# Patient Record
Sex: Female | Born: 1940 | Race: White | Hispanic: No | Marital: Married | Smoking: Former smoker
Health system: Southern US, Community
[De-identification: ages and names within clinical notes are randomized; demographics above are authoritative.]

## PROBLEM LIST (undated history)

## (undated) DIAGNOSIS — E1169 Type 2 diabetes mellitus with other specified complication: Secondary | ICD-10-CM

## (undated) DIAGNOSIS — I10 Essential (primary) hypertension: Secondary | ICD-10-CM

## (undated) DIAGNOSIS — E782 Mixed hyperlipidemia: Secondary | ICD-10-CM

## (undated) DIAGNOSIS — M810 Age-related osteoporosis without current pathological fracture: Secondary | ICD-10-CM

## (undated) HISTORY — PX: CHOLECYSTECTOMY: SHX55

## (undated) HISTORY — DX: Mixed hyperlipidemia: E11.69

## (undated) HISTORY — DX: Mixed hyperlipidemia: E78.2

## (undated) HISTORY — DX: Age-related osteoporosis without current pathological fracture: M81.0

---
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
## 2004-09-07 ENCOUNTER — Ambulatory Visit: Payer: Self-pay | Admitting: Family Medicine

## 2004-10-11 ENCOUNTER — Ambulatory Visit: Payer: Self-pay | Admitting: Family Medicine

## 2004-12-06 ENCOUNTER — Ambulatory Visit: Payer: Self-pay | Admitting: Family Medicine

## 2005-01-23 ENCOUNTER — Ambulatory Visit: Payer: Self-pay | Admitting: Family Medicine

## 2005-02-15 ENCOUNTER — Ambulatory Visit: Payer: Self-pay | Admitting: Family Medicine

## 2005-06-26 ENCOUNTER — Ambulatory Visit: Payer: Self-pay | Admitting: Family Medicine

## 2005-07-22 ENCOUNTER — Ambulatory Visit: Payer: Self-pay | Admitting: Family Medicine

## 2005-10-10 ENCOUNTER — Ambulatory Visit: Payer: Self-pay | Admitting: Family Medicine

## 2005-12-19 ENCOUNTER — Ambulatory Visit: Payer: Self-pay | Admitting: Family Medicine

## 2012-04-07 DIAGNOSIS — N390 Urinary tract infection, site not specified: Secondary | ICD-10-CM | POA: Diagnosis not present

## 2012-04-07 DIAGNOSIS — N760 Acute vaginitis: Secondary | ICD-10-CM | POA: Diagnosis not present

## 2012-04-07 DIAGNOSIS — F329 Major depressive disorder, single episode, unspecified: Secondary | ICD-10-CM | POA: Diagnosis not present

## 2012-04-08 DIAGNOSIS — Z79899 Other long term (current) drug therapy: Secondary | ICD-10-CM | POA: Diagnosis not present

## 2012-04-08 DIAGNOSIS — E785 Hyperlipidemia, unspecified: Secondary | ICD-10-CM | POA: Diagnosis not present

## 2012-04-08 DIAGNOSIS — I10 Essential (primary) hypertension: Secondary | ICD-10-CM | POA: Diagnosis not present

## 2012-04-08 DIAGNOSIS — E119 Type 2 diabetes mellitus without complications: Secondary | ICD-10-CM | POA: Diagnosis not present

## 2012-05-20 DIAGNOSIS — F329 Major depressive disorder, single episode, unspecified: Secondary | ICD-10-CM | POA: Diagnosis not present

## 2012-06-28 DIAGNOSIS — E7800 Pure hypercholesterolemia, unspecified: Secondary | ICD-10-CM | POA: Diagnosis not present

## 2012-06-28 DIAGNOSIS — I10 Essential (primary) hypertension: Secondary | ICD-10-CM | POA: Diagnosis not present

## 2012-06-28 DIAGNOSIS — E119 Type 2 diabetes mellitus without complications: Secondary | ICD-10-CM | POA: Diagnosis not present

## 2012-06-28 DIAGNOSIS — R040 Epistaxis: Secondary | ICD-10-CM | POA: Diagnosis not present

## 2012-07-22 DIAGNOSIS — E785 Hyperlipidemia, unspecified: Secondary | ICD-10-CM | POA: Diagnosis not present

## 2012-07-22 DIAGNOSIS — N750 Cyst of Bartholin's gland: Secondary | ICD-10-CM | POA: Diagnosis not present

## 2012-07-22 DIAGNOSIS — E782 Mixed hyperlipidemia: Secondary | ICD-10-CM | POA: Diagnosis not present

## 2012-07-22 DIAGNOSIS — E119 Type 2 diabetes mellitus without complications: Secondary | ICD-10-CM | POA: Diagnosis not present

## 2012-07-22 DIAGNOSIS — I10 Essential (primary) hypertension: Secondary | ICD-10-CM | POA: Diagnosis not present

## 2012-07-22 DIAGNOSIS — B373 Candidiasis of vulva and vagina: Secondary | ICD-10-CM | POA: Diagnosis not present

## 2012-07-22 DIAGNOSIS — F329 Major depressive disorder, single episode, unspecified: Secondary | ICD-10-CM | POA: Diagnosis not present

## 2012-08-24 DIAGNOSIS — R5383 Other fatigue: Secondary | ICD-10-CM | POA: Diagnosis not present

## 2012-08-24 DIAGNOSIS — R5381 Other malaise: Secondary | ICD-10-CM | POA: Diagnosis not present

## 2012-08-24 DIAGNOSIS — R42 Dizziness and giddiness: Secondary | ICD-10-CM | POA: Diagnosis not present

## 2012-08-24 DIAGNOSIS — R112 Nausea with vomiting, unspecified: Secondary | ICD-10-CM | POA: Diagnosis not present

## 2013-01-12 DIAGNOSIS — H2589 Other age-related cataract: Secondary | ICD-10-CM | POA: Diagnosis not present

## 2013-02-02 DIAGNOSIS — H269 Unspecified cataract: Secondary | ICD-10-CM | POA: Diagnosis not present

## 2013-02-02 DIAGNOSIS — H2589 Other age-related cataract: Secondary | ICD-10-CM | POA: Diagnosis not present

## 2013-02-02 DIAGNOSIS — I2510 Atherosclerotic heart disease of native coronary artery without angina pectoris: Secondary | ICD-10-CM | POA: Diagnosis not present

## 2013-02-02 DIAGNOSIS — E119 Type 2 diabetes mellitus without complications: Secondary | ICD-10-CM | POA: Diagnosis not present

## 2013-02-02 DIAGNOSIS — H259 Unspecified age-related cataract: Secondary | ICD-10-CM | POA: Diagnosis not present

## 2013-03-02 DIAGNOSIS — H259 Unspecified age-related cataract: Secondary | ICD-10-CM | POA: Diagnosis not present

## 2013-03-02 DIAGNOSIS — I10 Essential (primary) hypertension: Secondary | ICD-10-CM | POA: Diagnosis not present

## 2013-03-02 DIAGNOSIS — H269 Unspecified cataract: Secondary | ICD-10-CM | POA: Diagnosis not present

## 2013-03-02 DIAGNOSIS — H2589 Other age-related cataract: Secondary | ICD-10-CM | POA: Diagnosis not present

## 2013-03-02 DIAGNOSIS — E119 Type 2 diabetes mellitus without complications: Secondary | ICD-10-CM | POA: Diagnosis not present

## 2013-04-14 DIAGNOSIS — E119 Type 2 diabetes mellitus without complications: Secondary | ICD-10-CM | POA: Diagnosis not present

## 2013-04-14 DIAGNOSIS — F329 Major depressive disorder, single episode, unspecified: Secondary | ICD-10-CM | POA: Diagnosis not present

## 2013-04-14 DIAGNOSIS — I10 Essential (primary) hypertension: Secondary | ICD-10-CM | POA: Diagnosis not present

## 2013-05-11 DIAGNOSIS — K591 Functional diarrhea: Secondary | ICD-10-CM | POA: Diagnosis not present

## 2013-07-14 DIAGNOSIS — F329 Major depressive disorder, single episode, unspecified: Secondary | ICD-10-CM | POA: Diagnosis not present

## 2013-07-14 DIAGNOSIS — E119 Type 2 diabetes mellitus without complications: Secondary | ICD-10-CM | POA: Diagnosis not present

## 2013-07-14 DIAGNOSIS — E785 Hyperlipidemia, unspecified: Secondary | ICD-10-CM | POA: Diagnosis not present

## 2013-07-14 DIAGNOSIS — B373 Candidiasis of vulva and vagina: Secondary | ICD-10-CM | POA: Diagnosis not present

## 2013-07-14 DIAGNOSIS — I10 Essential (primary) hypertension: Secondary | ICD-10-CM | POA: Diagnosis not present

## 2013-11-03 DIAGNOSIS — M1610 Unilateral primary osteoarthritis, unspecified hip: Secondary | ICD-10-CM | POA: Diagnosis not present

## 2013-11-03 DIAGNOSIS — E119 Type 2 diabetes mellitus without complications: Secondary | ICD-10-CM | POA: Diagnosis not present

## 2013-11-03 DIAGNOSIS — F3289 Other specified depressive episodes: Secondary | ICD-10-CM | POA: Diagnosis not present

## 2013-11-03 DIAGNOSIS — F329 Major depressive disorder, single episode, unspecified: Secondary | ICD-10-CM | POA: Diagnosis not present

## 2013-11-03 DIAGNOSIS — E785 Hyperlipidemia, unspecified: Secondary | ICD-10-CM | POA: Diagnosis not present

## 2013-11-03 DIAGNOSIS — I10 Essential (primary) hypertension: Secondary | ICD-10-CM | POA: Diagnosis not present

## 2014-02-02 DIAGNOSIS — N390 Urinary tract infection, site not specified: Secondary | ICD-10-CM | POA: Diagnosis not present

## 2014-02-02 DIAGNOSIS — M1610 Unilateral primary osteoarthritis, unspecified hip: Secondary | ICD-10-CM | POA: Diagnosis not present

## 2014-02-02 DIAGNOSIS — F3289 Other specified depressive episodes: Secondary | ICD-10-CM | POA: Diagnosis not present

## 2014-02-02 DIAGNOSIS — E785 Hyperlipidemia, unspecified: Secondary | ICD-10-CM | POA: Diagnosis not present

## 2014-02-02 DIAGNOSIS — I10 Essential (primary) hypertension: Secondary | ICD-10-CM | POA: Diagnosis not present

## 2014-02-02 DIAGNOSIS — F329 Major depressive disorder, single episode, unspecified: Secondary | ICD-10-CM | POA: Diagnosis not present

## 2014-02-02 DIAGNOSIS — E119 Type 2 diabetes mellitus without complications: Secondary | ICD-10-CM | POA: Diagnosis not present

## 2014-05-06 DIAGNOSIS — Z9849 Cataract extraction status, unspecified eye: Secondary | ICD-10-CM | POA: Diagnosis not present

## 2014-05-06 DIAGNOSIS — I10 Essential (primary) hypertension: Secondary | ICD-10-CM | POA: Diagnosis not present

## 2014-05-06 DIAGNOSIS — H5231 Anisometropia: Secondary | ICD-10-CM | POA: Diagnosis not present

## 2014-05-06 DIAGNOSIS — H524 Presbyopia: Secondary | ICD-10-CM | POA: Diagnosis not present

## 2014-05-06 DIAGNOSIS — E119 Type 2 diabetes mellitus without complications: Secondary | ICD-10-CM | POA: Diagnosis not present

## 2014-05-06 DIAGNOSIS — H35379 Puckering of macula, unspecified eye: Secondary | ICD-10-CM | POA: Diagnosis not present

## 2014-05-31 DIAGNOSIS — I10 Essential (primary) hypertension: Secondary | ICD-10-CM | POA: Diagnosis not present

## 2014-05-31 DIAGNOSIS — F329 Major depressive disorder, single episode, unspecified: Secondary | ICD-10-CM | POA: Diagnosis not present

## 2014-05-31 DIAGNOSIS — F3289 Other specified depressive episodes: Secondary | ICD-10-CM | POA: Diagnosis not present

## 2014-05-31 DIAGNOSIS — E119 Type 2 diabetes mellitus without complications: Secondary | ICD-10-CM | POA: Diagnosis not present

## 2014-05-31 DIAGNOSIS — Z6835 Body mass index (BMI) 35.0-35.9, adult: Secondary | ICD-10-CM | POA: Diagnosis not present

## 2014-05-31 DIAGNOSIS — M1610 Unilateral primary osteoarthritis, unspecified hip: Secondary | ICD-10-CM | POA: Diagnosis not present

## 2014-06-02 DIAGNOSIS — M76899 Other specified enthesopathies of unspecified lower limb, excluding foot: Secondary | ICD-10-CM | POA: Diagnosis not present

## 2014-06-08 DIAGNOSIS — M76899 Other specified enthesopathies of unspecified lower limb, excluding foot: Secondary | ICD-10-CM | POA: Diagnosis not present

## 2014-06-08 DIAGNOSIS — M25559 Pain in unspecified hip: Secondary | ICD-10-CM | POA: Diagnosis not present

## 2014-06-08 DIAGNOSIS — M25659 Stiffness of unspecified hip, not elsewhere classified: Secondary | ICD-10-CM | POA: Diagnosis not present

## 2014-06-16 DIAGNOSIS — M25659 Stiffness of unspecified hip, not elsewhere classified: Secondary | ICD-10-CM | POA: Diagnosis not present

## 2014-06-16 DIAGNOSIS — M76899 Other specified enthesopathies of unspecified lower limb, excluding foot: Secondary | ICD-10-CM | POA: Diagnosis not present

## 2014-06-16 DIAGNOSIS — M25559 Pain in unspecified hip: Secondary | ICD-10-CM | POA: Diagnosis not present

## 2014-06-27 DIAGNOSIS — H814 Vertigo of central origin: Secondary | ICD-10-CM | POA: Diagnosis not present

## 2014-06-27 DIAGNOSIS — Z6835 Body mass index (BMI) 35.0-35.9, adult: Secondary | ICD-10-CM | POA: Diagnosis not present

## 2014-06-28 DIAGNOSIS — R5381 Other malaise: Secondary | ICD-10-CM | POA: Diagnosis not present

## 2014-06-28 DIAGNOSIS — I10 Essential (primary) hypertension: Secondary | ICD-10-CM | POA: Diagnosis not present

## 2014-06-28 DIAGNOSIS — N390 Urinary tract infection, site not specified: Secondary | ICD-10-CM | POA: Diagnosis not present

## 2014-06-28 DIAGNOSIS — H8110 Benign paroxysmal vertigo, unspecified ear: Secondary | ICD-10-CM | POA: Diagnosis not present

## 2014-06-28 DIAGNOSIS — Z79899 Other long term (current) drug therapy: Secondary | ICD-10-CM | POA: Diagnosis not present

## 2014-06-28 DIAGNOSIS — R0602 Shortness of breath: Secondary | ICD-10-CM | POA: Diagnosis not present

## 2014-06-28 DIAGNOSIS — E7800 Pure hypercholesterolemia, unspecified: Secondary | ICD-10-CM | POA: Diagnosis not present

## 2014-06-28 DIAGNOSIS — E119 Type 2 diabetes mellitus without complications: Secondary | ICD-10-CM | POA: Diagnosis not present

## 2014-06-28 DIAGNOSIS — R42 Dizziness and giddiness: Secondary | ICD-10-CM | POA: Diagnosis not present

## 2014-07-01 DIAGNOSIS — E860 Dehydration: Secondary | ICD-10-CM | POA: Diagnosis not present

## 2014-07-01 DIAGNOSIS — N390 Urinary tract infection, site not specified: Secondary | ICD-10-CM | POA: Diagnosis not present

## 2014-07-07 DIAGNOSIS — L98499 Non-pressure chronic ulcer of skin of other sites with unspecified severity: Secondary | ICD-10-CM | POA: Diagnosis not present

## 2014-07-07 DIAGNOSIS — T8351XD Infection and inflammatory reaction due to indwelling urinary catheter, subsequent encounter: Secondary | ICD-10-CM | POA: Diagnosis not present

## 2014-07-07 DIAGNOSIS — Z6836 Body mass index (BMI) 36.0-36.9, adult: Secondary | ICD-10-CM | POA: Diagnosis not present

## 2014-07-07 DIAGNOSIS — R197 Diarrhea, unspecified: Secondary | ICD-10-CM | POA: Diagnosis not present

## 2014-07-14 DIAGNOSIS — Z6836 Body mass index (BMI) 36.0-36.9, adult: Secondary | ICD-10-CM | POA: Diagnosis not present

## 2014-07-14 DIAGNOSIS — H8149 Vertigo of central origin, unspecified ear: Secondary | ICD-10-CM | POA: Diagnosis not present

## 2014-07-14 DIAGNOSIS — R197 Diarrhea, unspecified: Secondary | ICD-10-CM | POA: Diagnosis not present

## 2014-07-14 DIAGNOSIS — E860 Dehydration: Secondary | ICD-10-CM | POA: Diagnosis not present

## 2014-07-19 DIAGNOSIS — M7061 Trochanteric bursitis, right hip: Secondary | ICD-10-CM | POA: Diagnosis not present

## 2014-08-11 DIAGNOSIS — Z1231 Encounter for screening mammogram for malignant neoplasm of breast: Secondary | ICD-10-CM | POA: Diagnosis not present

## 2014-08-26 DIAGNOSIS — M79675 Pain in left toe(s): Secondary | ICD-10-CM | POA: Diagnosis not present

## 2014-09-05 DIAGNOSIS — L608 Other nail disorders: Secondary | ICD-10-CM | POA: Diagnosis not present

## 2014-09-05 DIAGNOSIS — S90415S Abrasion, left lesser toe(s), sequela: Secondary | ICD-10-CM | POA: Diagnosis not present

## 2014-09-05 DIAGNOSIS — M2042 Other hammer toe(s) (acquired), left foot: Secondary | ICD-10-CM | POA: Diagnosis not present

## 2014-09-05 DIAGNOSIS — E119 Type 2 diabetes mellitus without complications: Secondary | ICD-10-CM | POA: Diagnosis not present

## 2014-10-05 DIAGNOSIS — I10 Essential (primary) hypertension: Secondary | ICD-10-CM | POA: Diagnosis not present

## 2014-10-05 DIAGNOSIS — E119 Type 2 diabetes mellitus without complications: Secondary | ICD-10-CM | POA: Diagnosis not present

## 2014-10-05 DIAGNOSIS — E785 Hyperlipidemia, unspecified: Secondary | ICD-10-CM | POA: Diagnosis not present

## 2014-10-05 DIAGNOSIS — Z6836 Body mass index (BMI) 36.0-36.9, adult: Secondary | ICD-10-CM | POA: Diagnosis not present

## 2014-10-06 DIAGNOSIS — Z23 Encounter for immunization: Secondary | ICD-10-CM | POA: Diagnosis not present

## 2014-12-28 DIAGNOSIS — R42 Dizziness and giddiness: Secondary | ICD-10-CM | POA: Diagnosis not present

## 2015-06-23 DIAGNOSIS — E119 Type 2 diabetes mellitus without complications: Secondary | ICD-10-CM | POA: Diagnosis not present

## 2015-06-23 DIAGNOSIS — I10 Essential (primary) hypertension: Secondary | ICD-10-CM | POA: Diagnosis not present

## 2015-06-23 DIAGNOSIS — E785 Hyperlipidemia, unspecified: Secondary | ICD-10-CM | POA: Diagnosis not present

## 2015-08-15 DIAGNOSIS — Z1231 Encounter for screening mammogram for malignant neoplasm of breast: Secondary | ICD-10-CM | POA: Diagnosis not present

## 2015-11-03 DIAGNOSIS — E785 Hyperlipidemia, unspecified: Secondary | ICD-10-CM | POA: Diagnosis not present

## 2015-11-03 DIAGNOSIS — E119 Type 2 diabetes mellitus without complications: Secondary | ICD-10-CM | POA: Diagnosis not present

## 2015-11-03 DIAGNOSIS — R42 Dizziness and giddiness: Secondary | ICD-10-CM | POA: Diagnosis not present

## 2015-11-03 DIAGNOSIS — R1310 Dysphagia, unspecified: Secondary | ICD-10-CM | POA: Diagnosis not present

## 2015-11-03 DIAGNOSIS — I10 Essential (primary) hypertension: Secondary | ICD-10-CM | POA: Diagnosis not present

## 2015-11-03 DIAGNOSIS — Z6833 Body mass index (BMI) 33.0-33.9, adult: Secondary | ICD-10-CM | POA: Diagnosis not present

## 2015-12-07 DIAGNOSIS — R1310 Dysphagia, unspecified: Secondary | ICD-10-CM | POA: Diagnosis not present

## 2015-12-13 DIAGNOSIS — K222 Esophageal obstruction: Secondary | ICD-10-CM | POA: Diagnosis not present

## 2015-12-13 DIAGNOSIS — K210 Gastro-esophageal reflux disease with esophagitis: Secondary | ICD-10-CM | POA: Diagnosis not present

## 2015-12-13 DIAGNOSIS — K2210 Ulcer of esophagus without bleeding: Secondary | ICD-10-CM | POA: Diagnosis not present

## 2015-12-13 DIAGNOSIS — K2950 Unspecified chronic gastritis without bleeding: Secondary | ICD-10-CM | POA: Diagnosis not present

## 2015-12-13 DIAGNOSIS — R1310 Dysphagia, unspecified: Secondary | ICD-10-CM | POA: Diagnosis not present

## 2015-12-26 DIAGNOSIS — Z5181 Encounter for therapeutic drug level monitoring: Secondary | ICD-10-CM | POA: Diagnosis not present

## 2015-12-26 DIAGNOSIS — Z9181 History of falling: Secondary | ICD-10-CM | POA: Diagnosis not present

## 2015-12-26 DIAGNOSIS — R42 Dizziness and giddiness: Secondary | ICD-10-CM | POA: Diagnosis not present

## 2015-12-28 DIAGNOSIS — K210 Gastro-esophageal reflux disease with esophagitis: Secondary | ICD-10-CM | POA: Diagnosis not present

## 2015-12-28 DIAGNOSIS — K222 Esophageal obstruction: Secondary | ICD-10-CM | POA: Diagnosis not present

## 2015-12-28 DIAGNOSIS — K5900 Constipation, unspecified: Secondary | ICD-10-CM | POA: Diagnosis not present

## 2016-03-12 DIAGNOSIS — E119 Type 2 diabetes mellitus without complications: Secondary | ICD-10-CM | POA: Diagnosis not present

## 2016-03-12 DIAGNOSIS — F329 Major depressive disorder, single episode, unspecified: Secondary | ICD-10-CM | POA: Diagnosis not present

## 2016-03-12 DIAGNOSIS — Z6834 Body mass index (BMI) 34.0-34.9, adult: Secondary | ICD-10-CM | POA: Diagnosis not present

## 2016-03-12 DIAGNOSIS — E785 Hyperlipidemia, unspecified: Secondary | ICD-10-CM | POA: Diagnosis not present

## 2016-03-12 DIAGNOSIS — I10 Essential (primary) hypertension: Secondary | ICD-10-CM | POA: Diagnosis not present

## 2016-04-17 DIAGNOSIS — R0602 Shortness of breath: Secondary | ICD-10-CM | POA: Diagnosis not present

## 2016-04-17 DIAGNOSIS — Z79899 Other long term (current) drug therapy: Secondary | ICD-10-CM | POA: Diagnosis not present

## 2016-04-17 DIAGNOSIS — E860 Dehydration: Secondary | ICD-10-CM | POA: Diagnosis not present

## 2016-04-17 DIAGNOSIS — R531 Weakness: Secondary | ICD-10-CM | POA: Diagnosis not present

## 2016-04-17 DIAGNOSIS — M5431 Sciatica, right side: Secondary | ICD-10-CM | POA: Diagnosis not present

## 2016-05-15 DIAGNOSIS — M1612 Unilateral primary osteoarthritis, left hip: Secondary | ICD-10-CM | POA: Diagnosis not present

## 2016-05-15 DIAGNOSIS — Z6833 Body mass index (BMI) 33.0-33.9, adult: Secondary | ICD-10-CM | POA: Diagnosis not present

## 2016-05-15 DIAGNOSIS — M5430 Sciatica, unspecified side: Secondary | ICD-10-CM | POA: Diagnosis not present

## 2016-05-23 DIAGNOSIS — E237 Disorder of pituitary gland, unspecified: Secondary | ICD-10-CM | POA: Diagnosis not present

## 2016-05-23 DIAGNOSIS — R531 Weakness: Secondary | ICD-10-CM | POA: Diagnosis not present

## 2016-05-23 DIAGNOSIS — R404 Transient alteration of awareness: Secondary | ICD-10-CM | POA: Diagnosis not present

## 2016-05-23 DIAGNOSIS — R55 Syncope and collapse: Secondary | ICD-10-CM | POA: Diagnosis not present

## 2016-05-24 DIAGNOSIS — R55 Syncope and collapse: Secondary | ICD-10-CM | POA: Diagnosis not present

## 2016-05-24 DIAGNOSIS — E237 Disorder of pituitary gland, unspecified: Secondary | ICD-10-CM | POA: Diagnosis not present

## 2016-05-27 DIAGNOSIS — E237 Disorder of pituitary gland, unspecified: Secondary | ICD-10-CM | POA: Diagnosis not present

## 2016-05-27 DIAGNOSIS — Z6833 Body mass index (BMI) 33.0-33.9, adult: Secondary | ICD-10-CM | POA: Diagnosis not present

## 2016-05-27 DIAGNOSIS — E230 Hypopituitarism: Secondary | ICD-10-CM | POA: Diagnosis not present

## 2016-05-28 DIAGNOSIS — R55 Syncope and collapse: Secondary | ICD-10-CM | POA: Diagnosis not present

## 2016-05-28 DIAGNOSIS — E236 Other disorders of pituitary gland: Secondary | ICD-10-CM | POA: Diagnosis not present

## 2016-05-28 DIAGNOSIS — E237 Disorder of pituitary gland, unspecified: Secondary | ICD-10-CM | POA: Diagnosis not present

## 2016-06-05 DIAGNOSIS — D352 Benign neoplasm of pituitary gland: Secondary | ICD-10-CM | POA: Diagnosis not present

## 2016-06-05 DIAGNOSIS — Z6832 Body mass index (BMI) 32.0-32.9, adult: Secondary | ICD-10-CM | POA: Diagnosis not present

## 2016-06-07 DIAGNOSIS — E237 Disorder of pituitary gland, unspecified: Secondary | ICD-10-CM | POA: Diagnosis not present

## 2016-06-07 DIAGNOSIS — E039 Hypothyroidism, unspecified: Secondary | ICD-10-CM | POA: Diagnosis not present

## 2016-06-07 DIAGNOSIS — Z6832 Body mass index (BMI) 32.0-32.9, adult: Secondary | ICD-10-CM | POA: Diagnosis not present

## 2016-06-12 DIAGNOSIS — E230 Hypopituitarism: Secondary | ICD-10-CM | POA: Diagnosis not present

## 2016-06-12 DIAGNOSIS — F329 Major depressive disorder, single episode, unspecified: Secondary | ICD-10-CM | POA: Diagnosis not present

## 2016-06-12 DIAGNOSIS — E039 Hypothyroidism, unspecified: Secondary | ICD-10-CM | POA: Diagnosis not present

## 2016-06-12 DIAGNOSIS — E119 Type 2 diabetes mellitus without complications: Secondary | ICD-10-CM | POA: Diagnosis not present

## 2016-06-19 DIAGNOSIS — L988 Other specified disorders of the skin and subcutaneous tissue: Secondary | ICD-10-CM | POA: Diagnosis not present

## 2016-06-19 DIAGNOSIS — E860 Dehydration: Secondary | ICD-10-CM | POA: Diagnosis not present

## 2016-06-19 DIAGNOSIS — K529 Noninfective gastroenteritis and colitis, unspecified: Secondary | ICD-10-CM | POA: Diagnosis not present

## 2016-07-08 DIAGNOSIS — R42 Dizziness and giddiness: Secondary | ICD-10-CM | POA: Diagnosis not present

## 2016-07-08 DIAGNOSIS — E0590 Thyrotoxicosis, unspecified without thyrotoxic crisis or storm: Secondary | ICD-10-CM | POA: Diagnosis not present

## 2016-07-08 DIAGNOSIS — I6789 Other cerebrovascular disease: Secondary | ICD-10-CM | POA: Diagnosis not present

## 2016-07-08 DIAGNOSIS — R0602 Shortness of breath: Secondary | ICD-10-CM | POA: Diagnosis not present

## 2016-07-08 DIAGNOSIS — R531 Weakness: Secondary | ICD-10-CM | POA: Diagnosis not present

## 2016-07-17 DIAGNOSIS — E0580 Other thyrotoxicosis without thyrotoxic crisis or storm: Secondary | ICD-10-CM | POA: Diagnosis not present

## 2016-08-02 DIAGNOSIS — S63502A Unspecified sprain of left wrist, initial encounter: Secondary | ICD-10-CM | POA: Diagnosis not present

## 2016-08-21 DIAGNOSIS — Z1231 Encounter for screening mammogram for malignant neoplasm of breast: Secondary | ICD-10-CM | POA: Diagnosis not present

## 2016-08-28 DIAGNOSIS — Z7689 Persons encountering health services in other specified circumstances: Secondary | ICD-10-CM | POA: Diagnosis not present

## 2016-08-28 DIAGNOSIS — E0580 Other thyrotoxicosis without thyrotoxic crisis or storm: Secondary | ICD-10-CM | POA: Diagnosis not present

## 2016-11-26 DIAGNOSIS — B86 Scabies: Secondary | ICD-10-CM | POA: Diagnosis not present

## 2016-11-29 ENCOUNTER — Other Ambulatory Visit: Payer: Self-pay | Admitting: Neurosurgery

## 2016-11-29 DIAGNOSIS — D352 Benign neoplasm of pituitary gland: Secondary | ICD-10-CM

## 2016-12-02 DIAGNOSIS — E119 Type 2 diabetes mellitus without complications: Secondary | ICD-10-CM | POA: Diagnosis not present

## 2016-12-02 DIAGNOSIS — I10 Essential (primary) hypertension: Secondary | ICD-10-CM | POA: Diagnosis not present

## 2016-12-02 DIAGNOSIS — E782 Mixed hyperlipidemia: Secondary | ICD-10-CM | POA: Diagnosis not present

## 2016-12-02 DIAGNOSIS — D352 Benign neoplasm of pituitary gland: Secondary | ICD-10-CM | POA: Diagnosis not present

## 2016-12-02 DIAGNOSIS — E0500 Thyrotoxicosis with diffuse goiter without thyrotoxic crisis or storm: Secondary | ICD-10-CM | POA: Diagnosis not present

## 2016-12-11 DIAGNOSIS — N959 Unspecified menopausal and perimenopausal disorder: Secondary | ICD-10-CM | POA: Diagnosis not present

## 2016-12-11 DIAGNOSIS — M8588 Other specified disorders of bone density and structure, other site: Secondary | ICD-10-CM | POA: Diagnosis not present

## 2016-12-11 DIAGNOSIS — M8580 Other specified disorders of bone density and structure, unspecified site: Secondary | ICD-10-CM | POA: Diagnosis not present

## 2016-12-11 DIAGNOSIS — Z780 Asymptomatic menopausal state: Secondary | ICD-10-CM | POA: Diagnosis not present

## 2016-12-14 ENCOUNTER — Ambulatory Visit
Admission: RE | Admit: 2016-12-14 | Discharge: 2016-12-14 | Disposition: A | Discharge status: Home / Self Care | Payer: Medicare Other | Source: Ambulatory Visit | Attending: Neurosurgery | Admitting: Neurosurgery

## 2016-12-14 DIAGNOSIS — R51 Headache: Secondary | ICD-10-CM | POA: Diagnosis not present

## 2016-12-14 DIAGNOSIS — D352 Benign neoplasm of pituitary gland: Secondary | ICD-10-CM

## 2016-12-14 MED ORDER — GADOBENATE DIMEGLUMINE 529 MG/ML IV SOLN
10.0000 mL | Freq: Once | INTRAVENOUS | Status: AC | PRN
  Administered 2016-12-14: 10 mL via INTRAVENOUS

## 2016-12-19 DIAGNOSIS — R946 Abnormal results of thyroid function studies: Secondary | ICD-10-CM | POA: Diagnosis not present

## 2016-12-20 DIAGNOSIS — E0590 Thyrotoxicosis, unspecified without thyrotoxic crisis or storm: Secondary | ICD-10-CM | POA: Diagnosis not present

## 2016-12-20 DIAGNOSIS — R946 Abnormal results of thyroid function studies: Secondary | ICD-10-CM | POA: Diagnosis not present

## 2017-04-02 DIAGNOSIS — D352 Benign neoplasm of pituitary gland: Secondary | ICD-10-CM | POA: Diagnosis not present

## 2017-04-02 DIAGNOSIS — E119 Type 2 diabetes mellitus without complications: Secondary | ICD-10-CM | POA: Diagnosis not present

## 2017-04-02 DIAGNOSIS — E782 Mixed hyperlipidemia: Secondary | ICD-10-CM | POA: Diagnosis not present

## 2017-04-02 DIAGNOSIS — I10 Essential (primary) hypertension: Secondary | ICD-10-CM | POA: Diagnosis not present

## 2017-04-02 DIAGNOSIS — M169 Osteoarthritis of hip, unspecified: Secondary | ICD-10-CM | POA: Diagnosis not present

## 2017-04-02 DIAGNOSIS — E0500 Thyrotoxicosis with diffuse goiter without thyrotoxic crisis or storm: Secondary | ICD-10-CM | POA: Diagnosis not present

## 2017-04-27 IMAGING — MR MR HEAD WO/W CM
19 series · 48 of 48 positions shown · IV contrast (multihance)
Comparison: Brain MRI 05/28/2016

CLINICAL DATA: 75-year-old female with frontal headaches, hearing
loss and confusion. Pituitary enlargement felt related to adenoma on
7690 MRI. Subsequent encounter.

Creatinine was obtained on site at [HOSPITAL] at [HOSPITAL].
Results: Creatinine 0.9 mg/dL.
EXAM:
MRI HEAD WITHOUT AND WITH CONTRAST
TECHNIQUE: Multiplanar, multiecho pulse sequences of the brain and surrounding
structures were obtained without and with intravenous contrast.
CONTRAST:  10mL MULTIHANCE GADOBENATE DIMEGLUMINE 529 MG/ML IV SOLN

[Series 5: T1 · sagittal · 4.0mm · 0.72mm/px · 2 of 27 slices shown (1 of 5)]
[im 1/27]
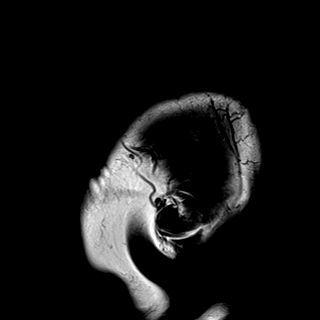
[im 27/27]
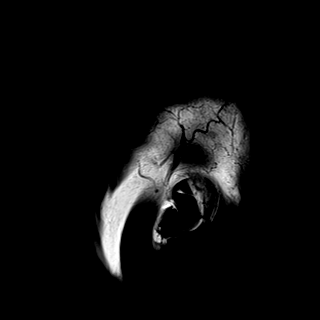

[Series 6: DWI · axial · 3.0mm · 1.44mm/px · z∈[-70,+86]mm · 7 of 82 slices shown (1 of 2)]
[im 1/82]
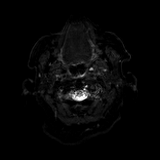
[im 14/82]
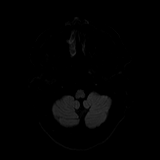
[im 28/82]
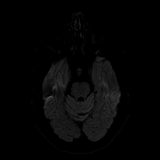
[im 41/82]
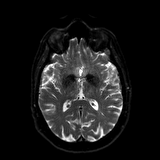
[im 55/82]
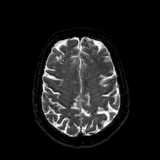
[im 68/82]
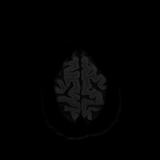
[im 82/82]
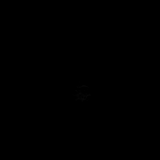

[Series 7: DWI · axial · 3.0mm · 1.44mm/px · z∈[-70,+86]mm · 4 of 41 slices shown (2 of 2)]
[im 1/41]
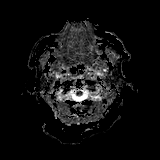
[im 14/41]
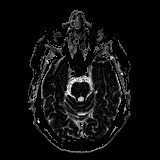
[im 27/41]
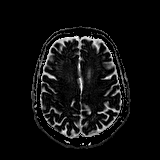
[im 41/41]
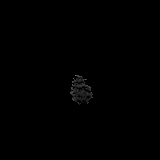

[Series 8: T2 · axial · 4.0mm · 0.36mm/px · z∈[-59,+71]mm · 2 of 26 slices shown]
[im 1/26]
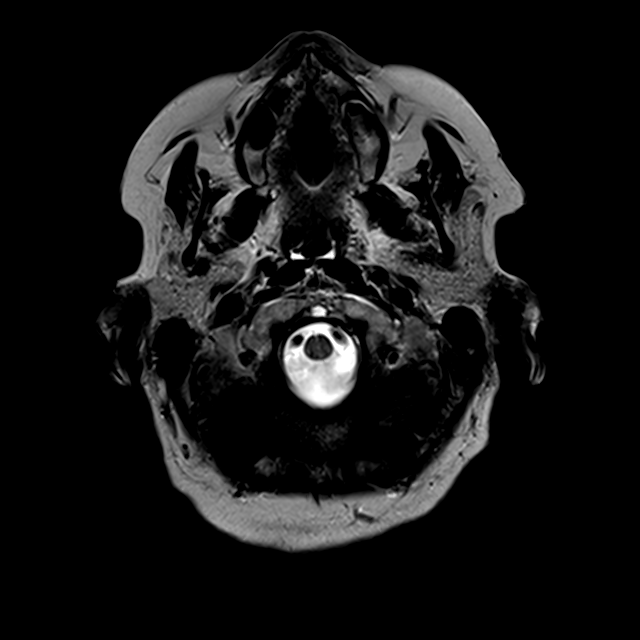
[im 26/26]
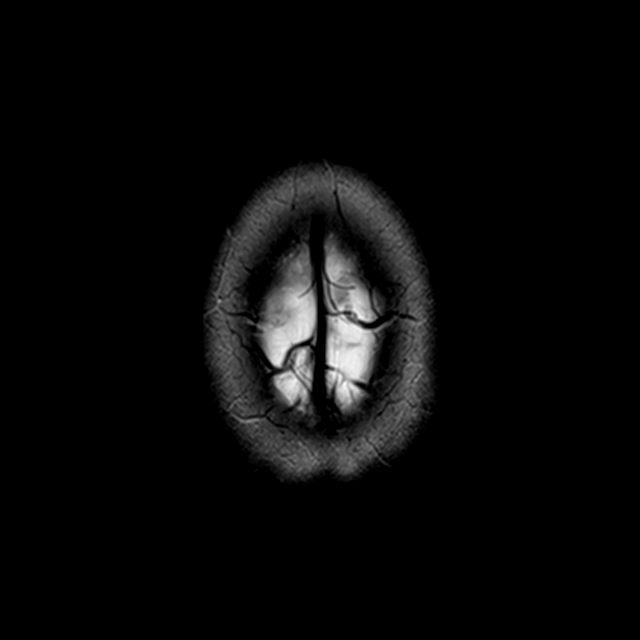

[Series 9: GRE · axial · 4.0mm · 0.69mm/px · z∈[-58,+72]mm · 2 of 26 slices shown]
[im 1/26]
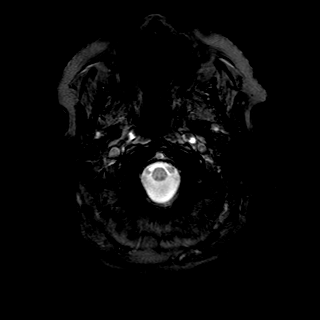
[im 26/26]
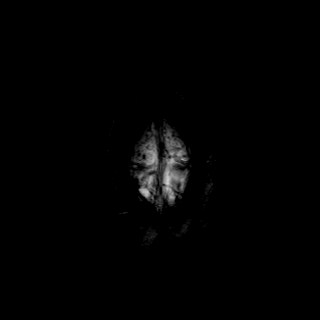

[Series 10: FLAIR · axial · 4.0mm · 0.72mm/px · z∈[-58,+71]mm · 2 of 26 slices shown]
[im 1/26]
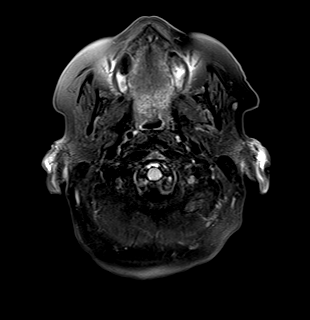
[im 26/26]
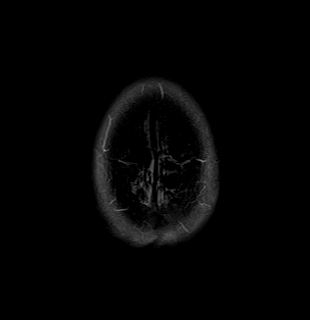

[Series 11: T1 · sagittal · 3.0mm · 0.42mm/px · 1 of 13 slices shown (2 of 5)]
[im 1/13]
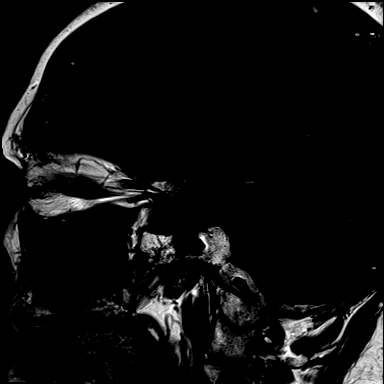

[Series 12: T1 · coronal · 3.0mm · 0.42mm/px · 1 of 10 slices shown (3 of 5)]
[im 1/10]
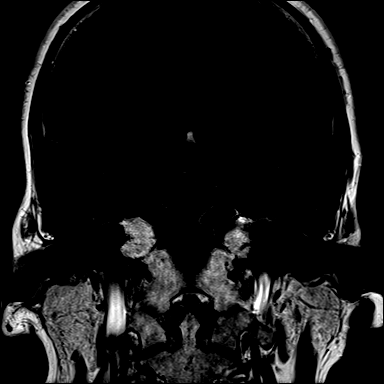

[Series 13: T1 · coronal · non-contrast · 3.0mm · 0.62mm/px · 1 of 9 slices shown (4 of 5)]
[im 1/9]
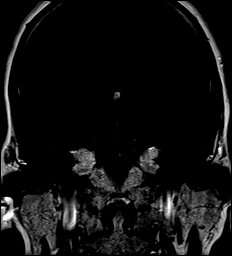

[Series 14: cor post dyn · coronal · 3.0mm · 0.62mm/px · 1 of 9 slices shown (1 of 6)]
[im 1/9]
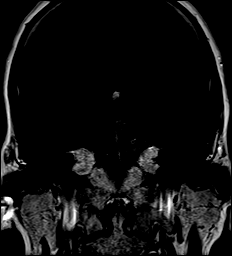

[Series 15: cor post dyn · coronal · 3.0mm · 0.62mm/px · 1 of 9 slices shown (2 of 6)]
[im 1/9]
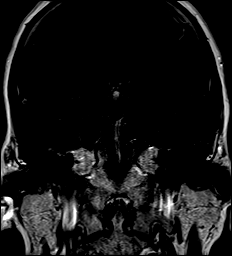

[Series 16: cor post dyn · coronal · 3.0mm · 0.62mm/px · 1 of 9 slices shown (3 of 6)]
[im 1/9]
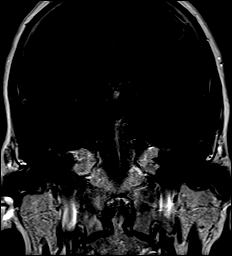

[Series 17: cor post dyn · coronal · 3.0mm · 0.62mm/px · 1 of 9 slices shown (4 of 6)]
[im 1/9]
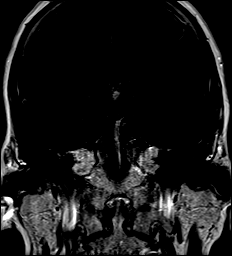

[Series 18: cor post dyn · coronal · 3.0mm · 0.62mm/px · 1 of 9 slices shown (5 of 6)]
[im 1/9]
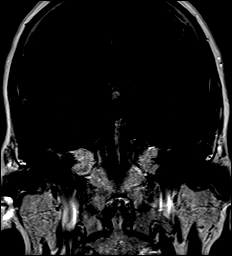

[Series 19: cor post dyn · coronal · 3.0mm · 0.62mm/px · 1 of 9 slices shown (6 of 6)]
[im 1/9]
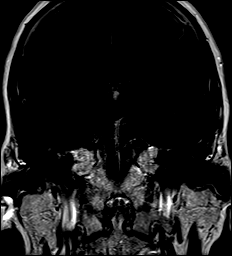

[Series 20: T1 post-contrast · sagittal · 3.0mm · 0.42mm/px · 1 of 13 slices shown (1 of 3)]
[im 1/13]
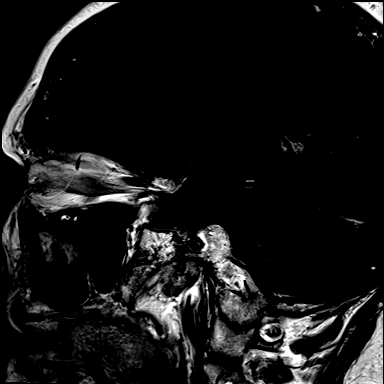

[Series 21: T1 post-contrast · coronal · 3.0mm · 0.42mm/px · 1 of 10 slices shown (2 of 3)]
[im 1/10]
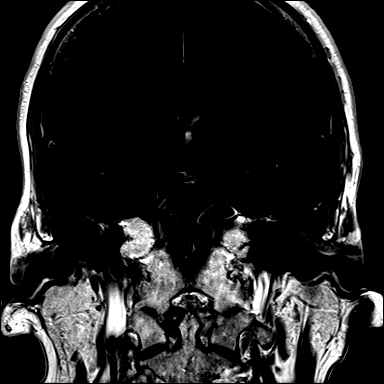

[Series 22: T1 · axial · 1.0mm · 0.86mm/px · z∈[-66,+108]mm · 15 of 175 slices shown (5 of 5)]
[im 1/175]
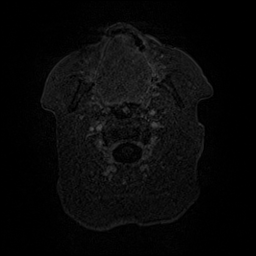
[im 13/175]
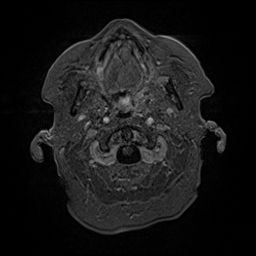
[im 25/175]
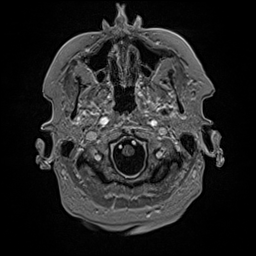
[im 38/175]
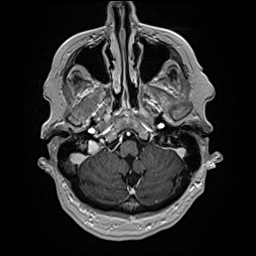
[im 50/175]
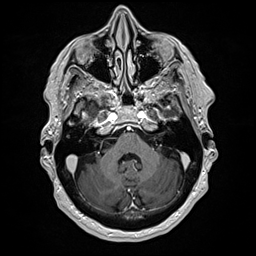
[im 63/175]
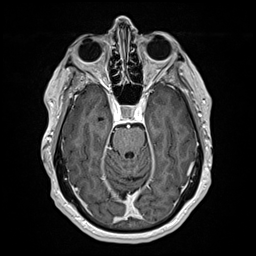
[im 75/175]
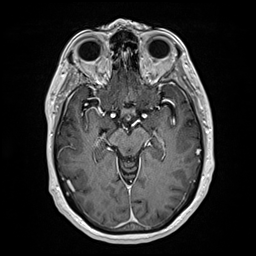
[im 88/175]
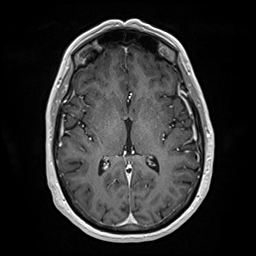
[im 100/175]
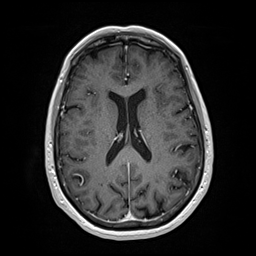
[im 112/175]
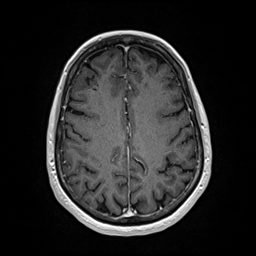
[im 125/175]
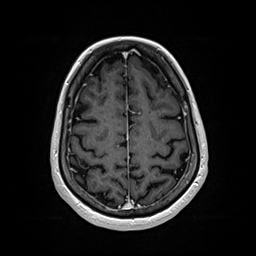
[im 137/175]
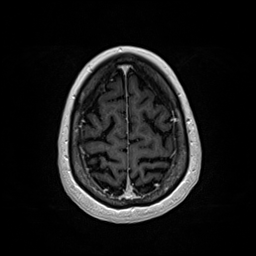
[im 150/175]
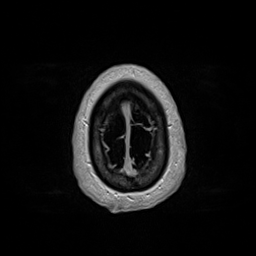
[im 162/175]
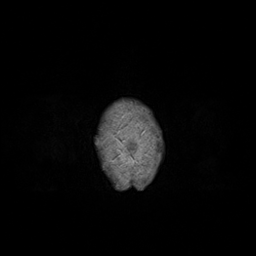
[im 175/175]
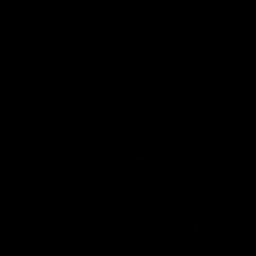

[Series 23: T1 post-contrast · coronal · 4.0mm · 0.47mm/px · 3 of 32 slices shown (3 of 3)]
[im 1/32]
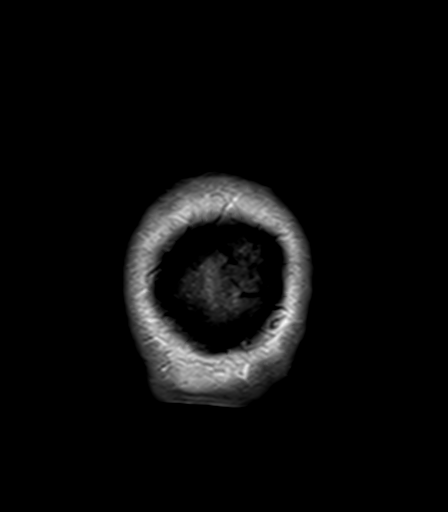
[im 16/32]
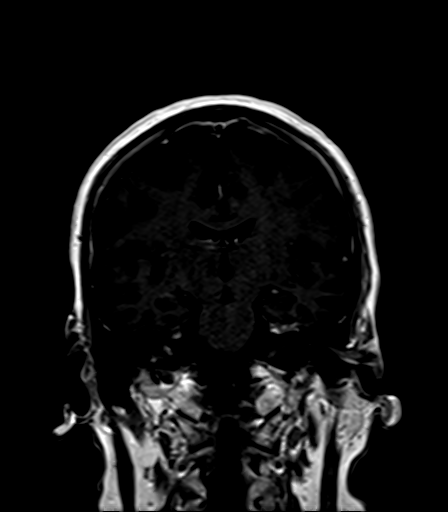
[im 32/32]
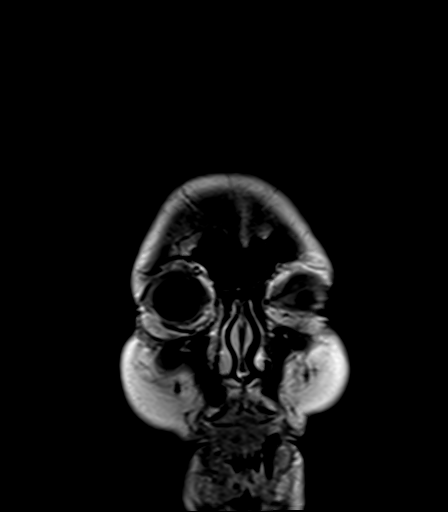

[48 of 48 positions shown; findings below may reference images not displayed]

FINDINGS: Brain: No restricted diffusion to suggest acute infarction. No
midline shift, mass effect, evidence of mass lesion,
ventriculomegaly, extra-axial collection or acute intracranial
hemorrhage. Cervicomedullary junction within normal limits.

Widely scattered cerebral white matter T2 and FLAIR hyperintensity
is stable since 7690. No cortical encephalomalacia. No chronic
cerebral blood products. Mild thalamic T2 heterogeneity may be
related to perivascular spaces. Excluding the pituitary region, no
abnormal intracranial enhancement. No dural thickening.

Vascular: Major intracranial vascular flow voids are stable and
appear normal.

Skull and upper cervical spine: Negative. Normal bone marrow signal.

Sinuses/Orbits: Stable and negative. Visible internal auditory
structures appear normal. Negative scalp soft tissues.

Other: Round heterogeneously hypoenhancing mass in the right aspect
of the sella turcica is stable measuring 10 x 9 x 10 mm (AP by
transverse by CC). Associated mild deviation of the infundibulum to
the left. None mildly convex upper margin of the pituitary but no
other suprasellar extension or mass effect. Normal hypothalamus. No
cavernous sinus involvement. No other abnormal enhancement.
IMPRESSION: 1. Stable rounded pituitary adenoma measuring 10 mm occupying the
right aspect of the sella. No suprasellar or cavernous sinus
extension.
2. No new intracranial abnormality. Stable MRI appearance of the
brain since [DATE].

## 2017-08-06 DIAGNOSIS — E782 Mixed hyperlipidemia: Secondary | ICD-10-CM | POA: Diagnosis not present

## 2017-08-06 DIAGNOSIS — M169 Osteoarthritis of hip, unspecified: Secondary | ICD-10-CM | POA: Diagnosis not present

## 2017-08-06 DIAGNOSIS — M810 Age-related osteoporosis without current pathological fracture: Secondary | ICD-10-CM | POA: Diagnosis not present

## 2017-08-06 DIAGNOSIS — E119 Type 2 diabetes mellitus without complications: Secondary | ICD-10-CM | POA: Diagnosis not present

## 2017-08-06 DIAGNOSIS — D352 Benign neoplasm of pituitary gland: Secondary | ICD-10-CM | POA: Diagnosis not present

## 2017-08-06 DIAGNOSIS — E0500 Thyrotoxicosis with diffuse goiter without thyrotoxic crisis or storm: Secondary | ICD-10-CM | POA: Diagnosis not present

## 2017-08-06 DIAGNOSIS — I10 Essential (primary) hypertension: Secondary | ICD-10-CM | POA: Diagnosis not present

## 2017-12-08 DIAGNOSIS — E782 Mixed hyperlipidemia: Secondary | ICD-10-CM | POA: Diagnosis not present

## 2017-12-08 DIAGNOSIS — E119 Type 2 diabetes mellitus without complications: Secondary | ICD-10-CM | POA: Diagnosis not present

## 2017-12-08 DIAGNOSIS — M169 Osteoarthritis of hip, unspecified: Secondary | ICD-10-CM | POA: Diagnosis not present

## 2017-12-08 DIAGNOSIS — D352 Benign neoplasm of pituitary gland: Secondary | ICD-10-CM | POA: Diagnosis not present

## 2017-12-08 DIAGNOSIS — M810 Age-related osteoporosis without current pathological fracture: Secondary | ICD-10-CM | POA: Diagnosis not present

## 2017-12-08 DIAGNOSIS — E0500 Thyrotoxicosis with diffuse goiter without thyrotoxic crisis or storm: Secondary | ICD-10-CM | POA: Diagnosis not present

## 2017-12-08 DIAGNOSIS — I10 Essential (primary) hypertension: Secondary | ICD-10-CM | POA: Diagnosis not present

## 2018-01-16 DIAGNOSIS — N390 Urinary tract infection, site not specified: Secondary | ICD-10-CM | POA: Diagnosis not present

## 2018-01-16 DIAGNOSIS — R1084 Generalized abdominal pain: Secondary | ICD-10-CM | POA: Diagnosis not present

## 2018-04-08 DIAGNOSIS — E782 Mixed hyperlipidemia: Secondary | ICD-10-CM | POA: Diagnosis not present

## 2018-04-08 DIAGNOSIS — Z6832 Body mass index (BMI) 32.0-32.9, adult: Secondary | ICD-10-CM | POA: Diagnosis not present

## 2018-04-08 DIAGNOSIS — E1169 Type 2 diabetes mellitus with other specified complication: Secondary | ICD-10-CM | POA: Diagnosis not present

## 2018-04-08 DIAGNOSIS — M810 Age-related osteoporosis without current pathological fracture: Secondary | ICD-10-CM | POA: Diagnosis not present

## 2018-04-08 DIAGNOSIS — D352 Benign neoplasm of pituitary gland: Secondary | ICD-10-CM | POA: Diagnosis not present

## 2018-04-08 DIAGNOSIS — I10 Essential (primary) hypertension: Secondary | ICD-10-CM | POA: Diagnosis not present

## 2018-04-08 DIAGNOSIS — E0500 Thyrotoxicosis with diffuse goiter without thyrotoxic crisis or storm: Secondary | ICD-10-CM | POA: Diagnosis not present

## 2018-04-08 DIAGNOSIS — M169 Osteoarthritis of hip, unspecified: Secondary | ICD-10-CM | POA: Diagnosis not present

## 2018-04-29 DIAGNOSIS — R600 Localized edema: Secondary | ICD-10-CM | POA: Diagnosis not present

## 2018-04-29 DIAGNOSIS — I8002 Phlebitis and thrombophlebitis of superficial vessels of left lower extremity: Secondary | ICD-10-CM | POA: Diagnosis not present

## 2018-08-10 DIAGNOSIS — S8991XA Unspecified injury of right lower leg, initial encounter: Secondary | ICD-10-CM | POA: Diagnosis not present

## 2018-08-10 DIAGNOSIS — M25561 Pain in right knee: Secondary | ICD-10-CM | POA: Diagnosis not present

## 2018-08-10 DIAGNOSIS — N3000 Acute cystitis without hematuria: Secondary | ICD-10-CM | POA: Diagnosis not present

## 2018-08-10 DIAGNOSIS — E1169 Type 2 diabetes mellitus with other specified complication: Secondary | ICD-10-CM | POA: Diagnosis not present

## 2018-08-10 DIAGNOSIS — I10 Essential (primary) hypertension: Secondary | ICD-10-CM | POA: Diagnosis not present

## 2018-08-10 DIAGNOSIS — E0500 Thyrotoxicosis with diffuse goiter without thyrotoxic crisis or storm: Secondary | ICD-10-CM | POA: Diagnosis not present

## 2018-08-10 DIAGNOSIS — M25562 Pain in left knee: Secondary | ICD-10-CM | POA: Diagnosis not present

## 2018-08-10 DIAGNOSIS — E782 Mixed hyperlipidemia: Secondary | ICD-10-CM | POA: Diagnosis not present

## 2018-08-10 DIAGNOSIS — Z6833 Body mass index (BMI) 33.0-33.9, adult: Secondary | ICD-10-CM | POA: Diagnosis not present

## 2018-09-23 DIAGNOSIS — S2231XA Fracture of one rib, right side, initial encounter for closed fracture: Secondary | ICD-10-CM | POA: Diagnosis not present

## 2018-10-01 DIAGNOSIS — S2231XD Fracture of one rib, right side, subsequent encounter for fracture with routine healing: Secondary | ICD-10-CM | POA: Diagnosis not present

## 2018-10-01 DIAGNOSIS — S2231XA Fracture of one rib, right side, initial encounter for closed fracture: Secondary | ICD-10-CM | POA: Diagnosis not present

## 2018-10-15 DIAGNOSIS — S2231XS Fracture of one rib, right side, sequela: Secondary | ICD-10-CM | POA: Diagnosis not present

## 2018-10-15 DIAGNOSIS — S2231XD Fracture of one rib, right side, subsequent encounter for fracture with routine healing: Secondary | ICD-10-CM | POA: Diagnosis not present

## 2018-10-29 DIAGNOSIS — S2231XD Fracture of one rib, right side, subsequent encounter for fracture with routine healing: Secondary | ICD-10-CM | POA: Diagnosis not present

## 2018-12-07 DIAGNOSIS — M810 Age-related osteoporosis without current pathological fracture: Secondary | ICD-10-CM | POA: Diagnosis not present

## 2018-12-07 DIAGNOSIS — D352 Benign neoplasm of pituitary gland: Secondary | ICD-10-CM | POA: Diagnosis not present

## 2018-12-07 DIAGNOSIS — E1169 Type 2 diabetes mellitus with other specified complication: Secondary | ICD-10-CM | POA: Diagnosis not present

## 2018-12-07 DIAGNOSIS — I10 Essential (primary) hypertension: Secondary | ICD-10-CM | POA: Diagnosis not present

## 2018-12-07 DIAGNOSIS — E782 Mixed hyperlipidemia: Secondary | ICD-10-CM | POA: Diagnosis not present

## 2018-12-07 DIAGNOSIS — Z6833 Body mass index (BMI) 33.0-33.9, adult: Secondary | ICD-10-CM | POA: Diagnosis not present

## 2018-12-07 DIAGNOSIS — M169 Osteoarthritis of hip, unspecified: Secondary | ICD-10-CM | POA: Diagnosis not present

## 2019-04-08 DIAGNOSIS — E1169 Type 2 diabetes mellitus with other specified complication: Secondary | ICD-10-CM | POA: Diagnosis not present

## 2019-04-08 DIAGNOSIS — Z1159 Encounter for screening for other viral diseases: Secondary | ICD-10-CM | POA: Diagnosis not present

## 2019-04-08 DIAGNOSIS — D352 Benign neoplasm of pituitary gland: Secondary | ICD-10-CM | POA: Diagnosis not present

## 2019-04-08 DIAGNOSIS — K210 Gastro-esophageal reflux disease with esophagitis: Secondary | ICD-10-CM | POA: Diagnosis not present

## 2019-04-08 DIAGNOSIS — Z6834 Body mass index (BMI) 34.0-34.9, adult: Secondary | ICD-10-CM | POA: Diagnosis not present

## 2019-04-08 DIAGNOSIS — I10 Essential (primary) hypertension: Secondary | ICD-10-CM | POA: Diagnosis not present

## 2019-04-08 DIAGNOSIS — E782 Mixed hyperlipidemia: Secondary | ICD-10-CM | POA: Diagnosis not present

## 2019-04-16 DIAGNOSIS — M810 Age-related osteoporosis without current pathological fracture: Secondary | ICD-10-CM | POA: Diagnosis not present

## 2019-04-16 DIAGNOSIS — Z780 Asymptomatic menopausal state: Secondary | ICD-10-CM | POA: Diagnosis not present

## 2019-04-16 DIAGNOSIS — Z13820 Encounter for screening for osteoporosis: Secondary | ICD-10-CM | POA: Diagnosis not present

## 2019-04-16 DIAGNOSIS — Z1231 Encounter for screening mammogram for malignant neoplasm of breast: Secondary | ICD-10-CM | POA: Diagnosis not present

## 2019-04-16 LAB — HM MAMMOGRAPHY

## 2019-04-16 LAB — HM DEXA SCAN

## 2019-04-20 DIAGNOSIS — E236 Other disorders of pituitary gland: Secondary | ICD-10-CM | POA: Diagnosis not present

## 2019-04-20 DIAGNOSIS — D352 Benign neoplasm of pituitary gland: Secondary | ICD-10-CM | POA: Diagnosis not present

## 2019-08-19 DIAGNOSIS — I10 Essential (primary) hypertension: Secondary | ICD-10-CM | POA: Diagnosis not present

## 2019-08-19 DIAGNOSIS — K2100 Gastro-esophageal reflux disease with esophagitis, without bleeding: Secondary | ICD-10-CM | POA: Diagnosis not present

## 2019-08-19 DIAGNOSIS — E782 Mixed hyperlipidemia: Secondary | ICD-10-CM | POA: Diagnosis not present

## 2019-08-19 DIAGNOSIS — E1169 Type 2 diabetes mellitus with other specified complication: Secondary | ICD-10-CM | POA: Diagnosis not present

## 2019-08-19 DIAGNOSIS — Z6833 Body mass index (BMI) 33.0-33.9, adult: Secondary | ICD-10-CM | POA: Diagnosis not present

## 2019-08-19 DIAGNOSIS — N3000 Acute cystitis without hematuria: Secondary | ICD-10-CM | POA: Diagnosis not present

## 2019-08-19 DIAGNOSIS — Z1159 Encounter for screening for other viral diseases: Secondary | ICD-10-CM | POA: Diagnosis not present

## 2019-08-19 DIAGNOSIS — D352 Benign neoplasm of pituitary gland: Secondary | ICD-10-CM | POA: Diagnosis not present

## 2019-08-19 DIAGNOSIS — M810 Age-related osteoporosis without current pathological fracture: Secondary | ICD-10-CM | POA: Diagnosis not present

## 2019-08-19 DIAGNOSIS — N3941 Urge incontinence: Secondary | ICD-10-CM | POA: Diagnosis not present

## 2019-10-19 DIAGNOSIS — N3000 Acute cystitis without hematuria: Secondary | ICD-10-CM | POA: Diagnosis not present

## 2019-10-19 DIAGNOSIS — Z1159 Encounter for screening for other viral diseases: Secondary | ICD-10-CM | POA: Diagnosis not present

## 2019-12-17 ENCOUNTER — Encounter: Payer: Self-pay | Admitting: Legal Medicine

## 2019-12-17 ENCOUNTER — Other Ambulatory Visit: Payer: Self-pay

## 2019-12-17 ENCOUNTER — Ambulatory Visit (INDEPENDENT_AMBULATORY_CARE_PROVIDER_SITE_OTHER): Payer: Commercial Managed Care - PPO | Admitting: Legal Medicine

## 2019-12-17 DIAGNOSIS — E1169 Type 2 diabetes mellitus with other specified complication: Secondary | ICD-10-CM | POA: Diagnosis not present

## 2019-12-17 DIAGNOSIS — K219 Gastro-esophageal reflux disease without esophagitis: Secondary | ICD-10-CM | POA: Insufficient documentation

## 2019-12-17 DIAGNOSIS — E119 Type 2 diabetes mellitus without complications: Secondary | ICD-10-CM | POA: Insufficient documentation

## 2019-12-17 DIAGNOSIS — M810 Age-related osteoporosis without current pathological fracture: Secondary | ICD-10-CM

## 2019-12-17 DIAGNOSIS — E669 Obesity, unspecified: Secondary | ICD-10-CM

## 2019-12-17 DIAGNOSIS — D352 Benign neoplasm of pituitary gland: Secondary | ICD-10-CM | POA: Insufficient documentation

## 2019-12-17 DIAGNOSIS — E1159 Type 2 diabetes mellitus with other circulatory complications: Secondary | ICD-10-CM

## 2019-12-17 DIAGNOSIS — E782 Mixed hyperlipidemia: Secondary | ICD-10-CM

## 2019-12-17 DIAGNOSIS — I10 Essential (primary) hypertension: Secondary | ICD-10-CM

## 2019-12-17 DIAGNOSIS — M169 Osteoarthritis of hip, unspecified: Secondary | ICD-10-CM | POA: Insufficient documentation

## 2019-12-17 DIAGNOSIS — K2100 Gastro-esophageal reflux disease with esophagitis, without bleeding: Secondary | ICD-10-CM | POA: Diagnosis not present

## 2019-12-17 DIAGNOSIS — M160 Bilateral primary osteoarthritis of hip: Secondary | ICD-10-CM

## 2019-12-17 HISTORY — DX: Type 2 diabetes mellitus with other circulatory complications: E66.9

## 2019-12-17 HISTORY — DX: Age-related osteoporosis without current pathological fracture: M81.0

## 2019-12-17 HISTORY — DX: Benign neoplasm of pituitary gland: D35.2

## 2019-12-17 HISTORY — DX: Type 2 diabetes mellitus with other circulatory complications: E11.59

## 2019-12-17 HISTORY — DX: Essential (primary) hypertension: I10

## 2019-12-17 HISTORY — DX: Osteoarthritis of hip, unspecified: M16.9

## 2019-12-17 HISTORY — DX: Gastro-esophageal reflux disease without esophagitis: K21.9

## 2019-12-17 MED ORDER — METFORMIN HCL 500 MG PO TABS: 500.0000 mg | ORAL_TABLET | Freq: Every day | ORAL | 6 refills | Status: DC

## 2019-12-17 NOTE — Progress Notes (Signed)
Established Patient Office Visit  Subjective:  Patient ID: Diane Chang, female    DOB: March 24, 1941  Age: 79 y.o. MRN: [id number]  CC:  Chief Complaint  Patient presents with  . Hypertension  . Hyperlipidemia  . Gastroesophageal Reflux  . Diabetes    HPI ARTAVIA LOH presents for Chronic visit  Patient present with type 2 diabetes.  Specifically, this is type 2, noninsulin requiring diabetes, complicated by hypertension and hyperlipidemia.  Compliance with treatment has been good; patient take medicines as directed, maintains diet and exercise regimen, follows up as directed, and is keeping glucose diary.  Date of  diagnosis 2010.  Depression screen has been performed.Tobacco screen nonsmoker. Current medicines for diabetes metoprolol.  Patient is on lisinopril for renal protection and atorvastatin for cholesterol control.  Patient performs foot exams daily and last ophthalmologic exam was one year ago.  Patient presents for follow up of hypertension.  Patient tolerating lisinopril/HCTZ well with side effects.  Patient was diagnosed with hypertension 2010 so has been treated for hypertension for 10 years.Patient is working on maintaining diet and exercise regimen and follows up as directed. Complication include none.  Patient presents with hyperlipidemia.  Compliance with treatment has been good; patient takes medicines as directed, maintains low cholesterol diet, follows up as directed, and maintains exercise regimen.  Patient is using atorvastatin without problems.  Past Medical History:  Diagnosis Date  . 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 Benign essential hypertension   . DM type 2 with diabetic mixed hyperlipidemia ([place])   . Mixed hyperlipidemia     History reviewed. No pertinent surgical history.  History reviewed. No pertinent family history.  Social History   Socioeconomic History  . Marital status: Married    Spouse name: Not on file    . Number of children: Not on file  . Years of education: Not on file  . Highest education level: Not on file  Occupational History  . Occupation: packing  Tobacco Use  . Smoking status: Former Smoker    Types: Cigarettes    Quit date: 1972    Years since quitting: 49.2  . Smokeless tobacco: Never Used  Substance and Sexual Activity  . Alcohol use: Never  . Drug use: Never  . Sexual activity: Not Currently  Other Topics Concern  . Not on file  Social History Narrative  . Not on file   Social Determinants of Health   Financial Resource Strain:   . Difficulty of Paying Living Expenses:   Food Insecurity:   . Worried About Charity fundraiser in the Last Year:   . Arboriculturist in the Last Year:   Transportation Needs:   . Film/video editor (Medical):   Marland Kitchen Lack of Transportation (Non-Medical):   Physical Activity:   . Days of Exercise per Week:   . Minutes of Exercise per Session:   Stress:   . Feeling of Stress :   Social Connections:   . Frequency of Communication with Friends and Family:   . Frequency of Social Gatherings with Friends and Family:   . Attends Religious Services:   . Active Member of Clubs or Organizations:   . Attends Archivist Meetings:   Marland Kitchen Marital Status:   Intimate Partner Violence:   . Fear of Current or Ex-Partner:   . Emotionally Abused:   Marland Kitchen Physically Abused:   . Sexually Abused:     Outpatient Medications Prior to Visit  Medication Sig Dispense Refill  . alendronate (FOSAMAX) 70 MG tablet Take 70 mg by mouth once a week.    Marland Kitchen atorvastatin (LIPITOR) 40 MG tablet Take 40 mg by mouth daily.    Marland Kitchen lisinopril-hydrochlorothiazide (ZESTORETIC) 20-12.5 MG tablet Take 1 tablet by mouth daily.     . naproxen sodium (ALEVE) 220 MG tablet Take 220 mg by mouth 2 (two) times daily as needed.    Marland Kitchen omeprazole (PRILOSEC) 40 MG capsule Take 40 mg by mouth 2 (two) times daily.    . metFORMIN (GLUCOPHAGE) 500 MG tablet Take 500 mg by mouth  daily with breakfast.      No facility-administered medications prior to visit.    No Known Allergies  ROS Review of Systems  Constitutional: Negative.   HENT: Negative.   Eyes: Negative.   Respiratory: Negative.   Cardiovascular: Negative.   Gastrointestinal: Negative.   Endocrine: Negative.   Genitourinary: Negative.   Musculoskeletal: Positive for arthralgias.  Skin: Negative.   Neurological: Negative.   Psychiatric/Behavioral: Negative.       Objective:    Physical Exam  Constitutional: She is oriented to person, place, and time. She appears well-developed and well-nourished.  HENT:  Head: Normocephalic and atraumatic.  Eyes: Pupils are equal, round, and reactive to light. Conjunctivae and EOM are normal.  Cardiovascular: Normal rate, regular rhythm and normal heart sounds.  Pulmonary/Chest: Effort normal and breath sounds normal.  Abdominal: Soft. Bowel sounds are normal.  Musculoskeletal:        General: Normal range of motion.     Cervical back: Normal range of motion and neck supple.  Neurological: She is alert and oriented to person, place, and time. She has normal reflexes.  Skin: Skin is warm and dry.  Psychiatric: She has a normal mood and affect.  Vitals reviewed.   BP (!) 152/80   Pulse (!) 55   Temp 97.7 F (36.5 C) (Temporal)   Resp 17   Ht 5' 2.99" (1.6 m)   Wt 210 lb (95.3 kg)   SpO2 97%   BMI 37.21 kg/m  Wt Readings from Last 3 Encounters:  12/17/19 210 lb (95.3 kg)     Health Maintenance Due  Topic Date Due  . OPHTHALMOLOGY EXAM  Never done  . TETANUS/TDAP  Never done  . DEXA SCAN  Never done  . PNA vac Low Risk Adult (1 of 2 - PCV13) Never done    There are no preventive care reminders to display for this patient.  Lab Results  Component Value Date   TSH 1.290 12/17/2019   Lab Results  Component Value Date   WBC 5.6 12/17/2019   HGB 14.3 12/17/2019   HCT 43.0 12/17/2019   MCV 99 (H) 12/17/2019   PLT 291 12/17/2019    Lab Results  Component Value Date   NA 142 12/17/2019   K 4.4 12/17/2019   CO2 25 12/17/2019   GLUCOSE 140 (H) 12/17/2019   BUN 15 12/17/2019   CREATININE 0.75 12/17/2019   BILITOT 0.5 12/17/2019   ALKPHOS 56 12/17/2019   AST 27 12/17/2019   ALT 25 12/17/2019   PROT 6.9 12/17/2019   ALBUMIN 4.2 12/17/2019   CALCIUM 9.8 12/17/2019   Lab Results  Component Value Date   CHOL 138 12/17/2019   Lab Results  Component Value Date   HDL 40 12/17/2019   Lab Results  Component Value Date   LDLCALC 76 12/17/2019   Lab Results  Component Value Date  TRIG 119 12/17/2019   Lab Results  Component Value Date   CHOLHDL 3.5 12/17/2019   Lab Results  Component Value Date   HGBA1C 7.3 (H) 12/17/2019      Assessment & Plan:   Problem List Items Addressed This Visit      Cardiovascular and Mediastinum   Benign hypertension    AN INDIVIDUAL CARE PLAN was established and reinforced today.  The patient's status was assessed using clinical findings on exam, lab and other diagnostic tests. The patient's disease status was assessed based on evidence-based guidelines and found to be well controlled. MEDICATIONS were reviewed. SELF MANAGEMENT GOALS have been discussed and patient's success at attaining the goal of low cholesterol was assessed. RECOMMENDATION given include regular exercise 3 days a week and low cholesterol/low fat diet. CLINICAL SUMMARY including written plan to identify barriers unique to the patient due to social or economic  reasons was discussed.      Relevant Medications   atorvastatin (LIPITOR) 40 MG tablet   lisinopril-hydrochlorothiazide (ZESTORETIC) 20-12.5 MG tablet   Other Relevant Orders   CBC with Differential (Completed)   Comprehensive metabolic panel (Completed)   Obesity, diabetes, and hypertension syndrome ([place])    An individual care plan was established and reinforced today.  The patient's status was assessed using clinical findings on exam, labs  and diagnostic testing. Patient success at meeting goals based on disease specific evidence-based guidelines and found to be good controlled. Medications were assessed and patient's understanding of the medical issues , including barriers were assessed. Recommend adherence to a diabetic diet, a graduated exercise program, HgbA1c level is checked quarterly, and urine microalbumin performed yearly .  Annual mono-filament sensation testing performed. Lower blood pressure and control hyperlipidemia is important. Get annual eye exams and annual flu shots and smoking cessation discussed.  Self management goals were discussed.      Relevant Medications   atorvastatin (LIPITOR) 40 MG tablet   lisinopril-hydrochlorothiazide (ZESTORETIC) 20-12.5 MG tablet   metFORMIN (GLUCOPHAGE) 500 MG tablet   Other Relevant Orders   Hemoglobin A1c (Completed)   TSH (Completed)     Digestive   GERD (gastroesophageal reflux disease)    Plan of care was formulated today.  She is doing well.  A plan of care was formulated using patient exam, tests and other sources to optimize care using evidence based information.  Recommend no smoking, no eating after supper, avoid fatty foods, elevate Head of bed, avoid tight fitting clothing.  Continue on omeprazole.      Relevant Medications   omeprazole (PRILOSEC) 40 MG capsule     Endocrine   Pituitary adenoma ([place])    This is stable.  [facility]      Relevant Orders   TSH (Completed)     Musculoskeletal and Integument   Osteoarthritis of hip    Doing well and aleve is keeping it under control      Relevant Medications   naproxen sodium (ALEVE) 220 MG tablet   Osteoporosis    AN INDIVIDUAL CARE PLAN was established and reinforced today.  The patient's status was assessed using clinical findings on exam, labs, and other diagnostic testing. Patient's success at meeting treatment goals based on disease specific evidence-bassed guidelines and found to be in good  control. RECOMMENDATIONS include maintaining present medicines and treatment.      Relevant Medications   alendronate (FOSAMAX) 70 MG tablet     Other   Mixed hyperlipidemia    AN INDIVIDUAL CARE  PLAN was established and reinforced today.  The patient's status was assessed using clinical findings on exam, lab and other diagnostic tests. The patient's disease status was assessed based on evidence-based guidelines and found to be well controlled. MEDICATIONS were reviewed. SELF MANAGEMENT GOALS have been discussed and patient's success at attaining the goal of low cholesterol was assessed. RECOMMENDATION given include regular exercise 3 days a week and low cholesterol/low fat diet. CLINICAL SUMMARY including written plan to identify barriers unique to the patient due to social or economic  reasons was discussed.      Relevant Medications   atorvastatin (LIPITOR) 40 MG tablet   lisinopril-hydrochlorothiazide (ZESTORETIC) 20-12.5 MG tablet   Other Relevant Orders   Lipid Panel (Completed)      Meds ordered this encounter  Medications  . metFORMIN (GLUCOPHAGE) 500 MG tablet    Sig: Take 1 tablet (500 mg total) by mouth daily with breakfast.    Dispense:  30 tablet    Refill:  6    Follow-up: Return in about 4 months (around 04/17/2020) for fasting.    Reinaldo Meeker, MD

## 2019-12-18 LAB — CBC WITH DIFFERENTIAL/PLATELET
Basophils Absolute: 0.1 10*3/uL (ref 0.0–0.2)
Basos: 1 %
EOS (ABSOLUTE): 0.2 10*3/uL (ref 0.0–0.4)
Eos: 4 %
Hematocrit: 43 % (ref 34.0–46.6)
Hemoglobin: 14.3 g/dL (ref 11.1–15.9)
Immature Grans (Abs): 0 10*3/uL (ref 0.0–0.1)
Immature Granulocytes: 0 %
Lymphocytes Absolute: 2.3 10*3/uL (ref 0.7–3.1)
Lymphs: 41 %
MCH: 32.9 pg (ref 26.6–33.0)
MCHC: 33.3 g/dL (ref 31.5–35.7)
MCV: 99 fL — ABNORMAL HIGH (ref 79–97)
Monocytes Absolute: 0.4 10*3/uL (ref 0.1–0.9)
Monocytes: 6 %
Neutrophils Absolute: 2.6 10*3/uL (ref 1.4–7.0)
Neutrophils: 48 %
Platelets: 291 10*3/uL (ref 150–450)
RBC: 4.34 x10E6/uL (ref 3.77–5.28)
RDW: 11.9 % (ref 11.7–15.4)
WBC: 5.6 10*3/uL (ref 3.4–10.8)

## 2019-12-18 LAB — LIPID PANEL
Chol/HDL Ratio: 3.5 ratio (ref 0.0–4.4)
Cholesterol, Total: 138 mg/dL (ref 100–199)
HDL: 40 mg/dL (ref >39)
LDL Chol Calc (NIH): 76 mg/dL (ref 0–99)
Triglycerides: 119 mg/dL (ref 0–149)
VLDL Cholesterol Cal: 22 mg/dL (ref 5–40)

## 2019-12-18 LAB — COMPREHENSIVE METABOLIC PANEL
ALT: 25 IU/L (ref 0–32)
AST: 27 IU/L (ref 0–40)
Albumin/Globulin Ratio: 1.6 (ref 1.2–2.2)
Albumin: 4.2 g/dL (ref 3.7–4.7)
Alkaline Phosphatase: 56 IU/L (ref 39–117)
BUN/Creatinine Ratio: 20 (ref 12–28)
BUN: 15 mg/dL (ref 8–27)
Bilirubin Total: 0.5 mg/dL (ref 0.0–1.2)
CO2: 25 mmol/L (ref 20–29)
Calcium: 9.8 mg/dL (ref 8.7–10.3)
Chloride: 102 mmol/L (ref 96–106)
Creatinine, Ser: 0.75 mg/dL (ref 0.57–1.00)
GFR calc Af Amer: 90 mL/min/{1.73_m2} (ref >59)
GFR calc non Af Amer: 78 mL/min/{1.73_m2} (ref >59)
Globulin, Total: 2.7 g/dL (ref 1.5–4.5)
Glucose: 140 mg/dL — ABNORMAL HIGH (ref 65–99)
Potassium: 4.4 mmol/L (ref 3.5–5.2)
Sodium: 142 mmol/L (ref 134–144)
Total Protein: 6.9 g/dL (ref 6.0–8.5)

## 2019-12-18 LAB — TSH: TSH: 1.29 u[IU]/mL (ref 0.450–4.500)

## 2019-12-18 LAB — HEMOGLOBIN A1C
Est. average glucose Bld gHb Est-mCnc: 163 mg/dL
Hgb A1c MFr Bld: 7.3 % — ABNORMAL HIGH (ref 4.8–5.6)

## 2019-12-18 LAB — CARDIOVASCULAR RISK ASSESSMENT

## 2019-12-18 NOTE — Assessment & Plan Note (Signed)
AN INDIVIDUAL CARE PLAN was established and reinforced today.  The patient's status was assessed using clinical findings on exam, labs, and other diagnostic testing. Patient's success at meeting treatment goals based on disease specific evidence-bassed guidelines and found to be in good control. RECOMMENDATIONS include maintaining present medicines and treatment. 

## 2019-12-18 NOTE — Assessment & Plan Note (Signed)
Doing well and aleve is keeping it under control

## 2019-12-18 NOTE — Progress Notes (Signed)
CBC normal, glucose 140, kidney and liver tests normal. Cholestertol normal, A1c 7.3 OK lp

## 2019-12-18 NOTE — Assessment & Plan Note (Signed)

## 2019-12-18 NOTE — Assessment & Plan Note (Signed)
Plan of care was formulated today.  She is doing well.  A plan of care was formulated using patient exam, tests and other sources to optimize care using evidence based information.  Recommend no smoking, no eating after supper, avoid fatty foods, elevate Head of bed, avoid tight fitting clothing.  Continue on omeprazole. 

## 2019-12-18 NOTE — Assessment & Plan Note (Signed)
This is stable.  [facility]

## 2019-12-18 NOTE — Assessment & Plan Note (Signed)

## 2020-01-08 DIAGNOSIS — Z23 Encounter for immunization: Secondary | ICD-10-CM | POA: Diagnosis not present

## 2020-01-15 ENCOUNTER — Other Ambulatory Visit: Payer: Self-pay | Admitting: Legal Medicine

## 2020-01-15 DIAGNOSIS — K2100 Gastro-esophageal reflux disease with esophagitis, without bleeding: Secondary | ICD-10-CM

## 2020-02-05 DIAGNOSIS — Z23 Encounter for immunization: Secondary | ICD-10-CM | POA: Diagnosis not present

## 2020-03-28 DIAGNOSIS — M79605 Pain in left leg: Secondary | ICD-10-CM | POA: Diagnosis not present

## 2020-04-09 ENCOUNTER — Other Ambulatory Visit: Payer: Self-pay | Admitting: Legal Medicine

## 2020-04-09 DIAGNOSIS — M810 Age-related osteoporosis without current pathological fracture: Secondary | ICD-10-CM

## 2020-04-10 ENCOUNTER — Other Ambulatory Visit: Payer: Self-pay | Admitting: Legal Medicine

## 2020-04-10 DIAGNOSIS — E782 Mixed hyperlipidemia: Secondary | ICD-10-CM

## 2020-04-10 DIAGNOSIS — I10 Essential (primary) hypertension: Secondary | ICD-10-CM

## 2020-04-17 ENCOUNTER — Other Ambulatory Visit: Payer: Self-pay

## 2020-04-17 ENCOUNTER — Ambulatory Visit (INDEPENDENT_AMBULATORY_CARE_PROVIDER_SITE_OTHER): Payer: Commercial Managed Care - PPO | Admitting: Legal Medicine

## 2020-04-17 ENCOUNTER — Encounter: Payer: Self-pay | Admitting: Legal Medicine

## 2020-04-17 VITALS — BP 120/60 | HR 58 | Temp 97.2°F | Resp 17 | Ht 64.0 in | Wt 207.4 lb

## 2020-04-17 DIAGNOSIS — D352 Benign neoplasm of pituitary gland: Secondary | ICD-10-CM

## 2020-04-17 DIAGNOSIS — E669 Obesity, unspecified: Secondary | ICD-10-CM

## 2020-04-17 DIAGNOSIS — I10 Essential (primary) hypertension: Secondary | ICD-10-CM | POA: Diagnosis not present

## 2020-04-17 DIAGNOSIS — K2100 Gastro-esophageal reflux disease with esophagitis, without bleeding: Secondary | ICD-10-CM

## 2020-04-17 DIAGNOSIS — M810 Age-related osteoporosis without current pathological fracture: Secondary | ICD-10-CM

## 2020-04-17 DIAGNOSIS — Z6832 Body mass index (BMI) 32.0-32.9, adult: Secondary | ICD-10-CM | POA: Insufficient documentation

## 2020-04-17 DIAGNOSIS — E782 Mixed hyperlipidemia: Secondary | ICD-10-CM

## 2020-04-17 DIAGNOSIS — M160 Bilateral primary osteoarthritis of hip: Secondary | ICD-10-CM

## 2020-04-17 DIAGNOSIS — E1159 Type 2 diabetes mellitus with other circulatory complications: Secondary | ICD-10-CM | POA: Diagnosis not present

## 2020-04-17 DIAGNOSIS — Z6835 Body mass index (BMI) 35.0-35.9, adult: Secondary | ICD-10-CM

## 2020-04-17 DIAGNOSIS — E1169 Type 2 diabetes mellitus with other specified complication: Secondary | ICD-10-CM

## 2020-04-17 HISTORY — DX: Body mass index (BMI) 32.0-32.9, adult: Z68.32

## 2020-04-17 HISTORY — DX: Body mass index (BMI) 35.0-35.9, adult: Z68.35

## 2020-04-17 LAB — POCT UA - MICROALBUMIN: Microalbumin Ur, POC: 30 mg/L

## 2020-04-17 MED ORDER — NAPROXEN 500 MG PO TABS: 500.0000 mg | ORAL_TABLET | Freq: Two times a day (BID) | ORAL | 5 refills | Status: DC

## 2020-04-17 NOTE — Progress Notes (Signed)
Subjective:  Patient ID: Diane Chang, female    DOB: 27-May-1941  Age: 79 y.o. MRN: [id number]  Chief Complaint  Patient presents with  . Diabetes  . Gastroesophageal Reflux    HPI: Chronic visit  Patient present with type 2 diabetes.  Specifically, this is type 2, on insulin  requiring diabetes, complicated by hypertension and hypercholesterolemia.  Compliance with treatment has been good; patient take medicines as directed, maintains diet and exercise regimen, follows up as directed, and is keeping glucose diary.  Date of  diagnosis 10.  Depression screen has been performed.Tobacco screen nonsmoker. Current medicines for diabetes metformin.  Patient is on lisinopril for renal protection and atorvastatin for cholesterol control.  Patient performs foot exams daily and last ophthalmologic exam was this year.  Patient has gastroesophageal reflux symptoms withesophagitis and LTRD.  The symptoms are noderate intensity.  Length of symptoms 10 years.  Medicines include omeprazole.  Complications include none.   Current Outpatient Medications on File Prior to Visit  Medication Sig Dispense Refill  . alendronate (FOSAMAX) 70 MG tablet TAKE ONE TABLET BY MOUTH WEEKLY 4 tablet 10  . atorvastatin (LIPITOR) 40 MG tablet TAKE 1 TABLET BY MOUTH EVERY DAY 30 tablet 6  . lisinopril-hydrochlorothiazide (ZESTORETIC) 20-12.5 MG tablet TAKE 1 TABLET BY MOUTH EVERY DAY 30 tablet 6  . metFORMIN (GLUCOPHAGE) 500 MG tablet Take 1 tablet (500 mg total) by mouth daily with breakfast. 30 tablet 6  . omeprazole (PRILOSEC) 40 MG capsule TAKE 1 CAPSULE BY MOUTH TWICE A DAY BEFORE A MEAL 60 capsule 4   No current facility-administered medications on file prior to visit.   Past Medical History:  Diagnosis Date  . 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 Benign essential hypertension   . DM type 2 with diabetic mixed hyperlipidemia ([place])   . Mixed hyperlipidemia    History reviewed. No  pertinent surgical history.  History reviewed. No pertinent family history. Social History   Socioeconomic History  . Marital status: Married    Spouse name: Not on file  . Number of children: Not on file  . Years of education: Not on file  . Highest education level: Not on file  Occupational History  . Occupation: packing  Tobacco Use  . Smoking status: Former Smoker    Types: Cigarettes    Quit date: 1972    Years since quitting: 49.5  . Smokeless tobacco: Never Used  Substance and Sexual Activity  . Alcohol use: Never  . Drug use: Never  . Sexual activity: Not Currently  Other Topics Concern  . Not on file  Social History Narrative  . Not on file   Social Determinants of Health   Financial Resource Strain:   . Difficulty of Paying Living Expenses:   Food Insecurity:   . Worried About Charity fundraiser in the Last Year:   . Arboriculturist in the Last Year:   Transportation Needs:   . Film/video editor (Medical):   Marland Kitchen Lack of Transportation (Non-Medical):   Physical Activity:   . Days of Exercise per Week:   . Minutes of Exercise per Session:   Stress:   . Feeling of Stress :   Social Connections:   . Frequency of Communication with Friends and Family:   . Frequency of Social Gatherings with Friends and Family:   . Attends Religious Services:   . Active Member of Clubs or Organizations:   . Attends Archivist  Meetings:   Marland Kitchen Marital Status:     Review of Systems  Constitutional: Negative.   HENT: Negative.   Eyes: Negative.   Respiratory: Negative.   Cardiovascular: Negative.   Gastrointestinal: Negative.   Endocrine: Negative.   Genitourinary: Negative.   Musculoskeletal: Negative.   Neurological: Negative.   Psychiatric/Behavioral: Negative.      Objective:  BP 120/60 (BP Location: Right Arm, Patient Position: Sitting)   Pulse (!) 58   Temp (!) 97.2 F (36.2 C) (Temporal)   Resp 17   Ht 5\' 4"  (1.626 m)   Wt 207 lb 6.4 oz (94.1  kg)   SpO2 97%   BMI 35.60 kg/m   BP/Weight 04/17/2020 10/29/4495  Systolic BP 530 051  Diastolic BP 60 80  Wt. (Lbs) 207.4 210  BMI 35.6 37.21    Physical Exam Vitals reviewed.  Constitutional:      Appearance: Normal appearance. She is obese.  HENT:     Head: Normocephalic and atraumatic.     Right Ear: Tympanic membrane, ear canal and external ear normal.     Left Ear: Tympanic membrane, ear canal and external ear normal.     Nose: Nose normal.     Mouth/Throat:     Mouth: Mucous membranes are moist.  Eyes:     Extraocular Movements: Extraocular movements intact.     Conjunctiva/sclera: Conjunctivae normal.     Pupils: Pupils are equal, round, and reactive to light.  Cardiovascular:     Rate and Rhythm: Normal rate and regular rhythm.     Pulses: Normal pulses.     Heart sounds: Normal heart sounds.  Pulmonary:     Effort: Pulmonary effort is normal.     Breath sounds: Normal breath sounds.  Abdominal:     General: Abdomen is flat. Bowel sounds are normal.     Palpations: Abdomen is soft.  Musculoskeletal:        General: Normal range of motion.     Cervical back: Normal range of motion and neck supple.  Skin:    General: Skin is warm and dry.     Capillary Refill: Capillary refill takes less than 2 seconds.  Neurological:     General: No focal deficit present.     Mental Status: She is alert and oriented to person, place, and time. Mental status is at baseline.     Diabetic Foot Exam - Simple   Simple Foot Form Diabetic Foot exam was performed with the following findings: Yes 04/17/2020  8:24 AM  Visual Inspection See comments: Yes Sensation Testing Intact to touch and monofilament testing bilaterally: Yes Pulse Check Posterior Tibialis and Dorsalis pulse intact bilaterally: Yes Comments Bunions bilaterally, early hammer toes      Lab Results  Component Value Date   WBC 5.6 12/17/2019   HGB 14.3 12/17/2019   HCT 43.0 12/17/2019   PLT 291 12/17/2019    GLUCOSE 140 (H) 12/17/2019   CHOL 138 12/17/2019   TRIG 119 12/17/2019   HDL 40 12/17/2019   LDLCALC 76 12/17/2019   ALT 25 12/17/2019   AST 27 12/17/2019   NA 142 12/17/2019   K 4.4 12/17/2019   CL 102 12/17/2019   CREATININE 0.75 12/17/2019   BUN 15 12/17/2019   CO2 25 12/17/2019   TSH 1.290 12/17/2019   HGBA1C 7.3 (H) 12/17/2019   MICROALBUR 30 04/17/2020      Assessment & Plan:   1. Primary osteoarthritis of both hips - naproxen (NAPROSYN) 500 MG  tablet; Take 1 tablet (500 mg total) by mouth 2 (two) times daily.  Dispense: 60 tablet; Refill: 5 AN INDIVIDUAL CARE PLAN was established and reinforced today.  The patient's status was assessed using clinical findings on exam, labs, and other diagnostic testing. Patient's success at meeting treatment goals based on disease specific evidence-bassed guidelines and found to be in fair control. RECOMMENDATIONS include use naprosyn.  2. Pituitary adenoma ([place]) No symptoms and she is followed by [facility]  3. Mixed hyperlipidemia - Lipid Panel - TSH AN INDIVIDUAL CARE PLAN for hyperlipidemia/ cholesterol was established and reinforced today.  The patient's status was assessed using clinical findings on exam, lab and other diagnostic tests. The patient's disease status was assessed based on evidence-based guidelines and found to be well controlled. MEDICATIONS were reviewed. SELF MANAGEMENT GOALS have been discussed and patient's success at attaining the goal of low cholesterol was assessed. RECOMMENDATION given include regular exercise 3 days a week and low cholesterol/low fat diet. CLINICAL SUMMARY including written plan to identify barriers unique to the patient due to social or economic  reasons was discussed.  4. Benign hypertension - CBC - Comprehensive metabolic panel An individual hypertension care plan was established and reinforced today.  The patient's status was assessed using clinical findings on exam and labs or  diagnostic tests. The patient's success at meeting treatment goals on disease specific evidence-based guidelines and found to be well controlled. SELF MANAGEMENT: The patient and I together assessed ways to personally work towards obtaining the recommended goals. RECOMMENDATIONS: avoid decongestants found in common cold remedies, decrease consumption of alcohol, perform routine monitoring of BP with home BP cuff, exercise, reduction of dietary salt, take medicines as prescribed, try not to miss doses and quit smoking.  Regular exercise and maintaining a healthy weight is needed.  Stress reduction may help. A CLINICAL SUMMARY including written plan identify barriers to care unique to individual due to social or financial issues.  We attempt to mutually creat solutions for individual and family understanding.  5. 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 Gastroesophageal reflux disease with esophagitis without hemorrhage Plan of care was formulated today.  She is doing well.  A plan of care was formulated using patient exam, tests and other sources to optimize care using evidence based information.  Recommend no smoking, no eating after supper, avoid fatty foods, elevate Head of bed, avoid tight fitting clothing.  Continue on omeprazole.  7. Obesity, diabetes, and hypertension syndrome (HCC) - Hemoglobin A1c - POCT UA - Microalbumin An individual care plan for diabetes was established and reinforced today.  The patient's status was assessed using clinical findings on exam, labs and diagnostic testing. Patient success at meeting goals based on disease specific evidence-based guidelines and found to be good controlled. Medications were assessed and patient's understanding of the medical issues , including barriers were assessed. Recommend adherence to a diabetic diet, a graduated exercise program, HgbA1c level is checked quarterly, and  urine microalbumin performed yearly .  Annual mono-filament sensation testing performed. Lower blood pressure and control hyperlipidemia is important. Get annual eye exams and annual flu shots and smoking cessation discussed.  Self management goals were discussed.  8. BMI 35.0-35.9,adult An individualize plan was formulated for obesity using patient history and physical exam to encourage weight loss.  An evidence based program was formulated.  Patient is to cut portion size with meals and to plan physical exercise 3 days a week at least 20  minutes.  Weight watchers and other programs are helpful.  Planned amount of weight loss 10 lbs.    Meds ordered this encounter  Medications  . naproxen (NAPROSYN) 500 MG tablet    Sig: Take 1 tablet (500 mg total) by mouth 2 (two) times daily.    Dispense:  60 tablet    Refill:  5    Orders Placed This Encounter  Procedures  . CBC  . Comprehensive metabolic panel  . Lipid Panel  . Hemoglobin A1c  . TSH  . POCT UA - Microalbumin     Follow-up: Return in about 4 months (around 08/18/2020) for fasting and MCR PE kim.  An After Visit Summary was printed and given to the patient.  [place] [phone]

## 2020-04-17 NOTE — Patient Instructions (Signed)

## 2020-04-18 LAB — COMPREHENSIVE METABOLIC PANEL
ALT: 30 IU/L (ref 0–32)
AST: 27 IU/L (ref 0–40)
Albumin/Globulin Ratio: 1.5 (ref 1.2–2.2)
Albumin: 4.1 g/dL (ref 3.7–4.7)
Alkaline Phosphatase: 58 IU/L (ref 48–121)
BUN/Creatinine Ratio: 18 (ref 12–28)
BUN: 15 mg/dL (ref 8–27)
Bilirubin Total: 0.4 mg/dL (ref 0.0–1.2)
CO2: 25 mmol/L (ref 20–29)
Calcium: 10.1 mg/dL (ref 8.7–10.3)
Chloride: 102 mmol/L (ref 96–106)
Creatinine, Ser: 0.82 mg/dL (ref 0.57–1.00)
GFR calc Af Amer: 80 mL/min/{1.73_m2} (ref >59)
GFR calc non Af Amer: 70 mL/min/{1.73_m2} (ref >59)
Globulin, Total: 2.8 g/dL (ref 1.5–4.5)
Glucose: 151 mg/dL — ABNORMAL HIGH (ref 65–99)
Potassium: 4.6 mmol/L (ref 3.5–5.2)
Sodium: 141 mmol/L (ref 134–144)
Total Protein: 6.9 g/dL (ref 6.0–8.5)

## 2020-04-18 LAB — LIPID PANEL
Chol/HDL Ratio: 4.1 ratio (ref 0.0–4.4)
Cholesterol, Total: 152 mg/dL (ref 100–199)
HDL: 37 mg/dL — ABNORMAL LOW (ref >39)
LDL Chol Calc (NIH): 83 mg/dL (ref 0–99)
Triglycerides: 189 mg/dL — ABNORMAL HIGH (ref 0–149)
VLDL Cholesterol Cal: 32 mg/dL (ref 5–40)

## 2020-04-18 LAB — CBC
Hematocrit: 43.1 % (ref 34.0–46.6)
Hemoglobin: 14.1 g/dL (ref 11.1–15.9)
MCH: 31.6 pg (ref 26.6–33.0)
MCHC: 32.7 g/dL (ref 31.5–35.7)
MCV: 97 fL (ref 79–97)
Platelets: 301 10*3/uL (ref 150–450)
RBC: 4.46 x10E6/uL (ref 3.77–5.28)
RDW: 12.4 % (ref 11.7–15.4)
WBC: 5.7 10*3/uL (ref 3.4–10.8)

## 2020-04-18 LAB — HEMOGLOBIN A1C
Est. average glucose Bld gHb Est-mCnc: 154 mg/dL
Hgb A1c MFr Bld: 7 % — ABNORMAL HIGH (ref 4.8–5.6)

## 2020-04-18 LAB — TSH: TSH: 2.56 u[IU]/mL (ref 0.450–4.500)

## 2020-04-18 LAB — CARDIOVASCULAR RISK ASSESSMENT

## 2020-04-18 NOTE — Progress Notes (Signed)
CBC normal, glucose 151, kidney and liver tests noral, triglycerides high watch diet, A1c 7.0, TSH 2.56 ok lp

## 2020-04-26 DIAGNOSIS — R42 Dizziness and giddiness: Secondary | ICD-10-CM | POA: Diagnosis not present

## 2020-04-26 DIAGNOSIS — E0590 Thyrotoxicosis, unspecified without thyrotoxic crisis or storm: Secondary | ICD-10-CM | POA: Diagnosis not present

## 2020-04-26 DIAGNOSIS — I6782 Cerebral ischemia: Secondary | ICD-10-CM | POA: Diagnosis not present

## 2020-04-26 DIAGNOSIS — E119 Type 2 diabetes mellitus without complications: Secondary | ICD-10-CM | POA: Diagnosis not present

## 2020-04-26 DIAGNOSIS — I10 Essential (primary) hypertension: Secondary | ICD-10-CM | POA: Diagnosis not present

## 2020-04-26 DIAGNOSIS — Z20828 Contact with and (suspected) exposure to other viral communicable diseases: Secondary | ICD-10-CM | POA: Diagnosis not present

## 2020-04-26 DIAGNOSIS — R112 Nausea with vomiting, unspecified: Secondary | ICD-10-CM | POA: Diagnosis not present

## 2020-04-26 DIAGNOSIS — R1111 Vomiting without nausea: Secondary | ICD-10-CM | POA: Diagnosis not present

## 2020-04-26 DIAGNOSIS — R1110 Vomiting, unspecified: Secondary | ICD-10-CM | POA: Diagnosis not present

## 2020-04-26 DIAGNOSIS — R110 Nausea: Secondary | ICD-10-CM | POA: Diagnosis not present

## 2020-04-26 DIAGNOSIS — R0902 Hypoxemia: Secondary | ICD-10-CM | POA: Diagnosis not present

## 2020-04-26 DIAGNOSIS — G319 Degenerative disease of nervous system, unspecified: Secondary | ICD-10-CM | POA: Diagnosis not present

## 2020-05-01 ENCOUNTER — Encounter: Payer: Self-pay | Admitting: Legal Medicine

## 2020-05-01 ENCOUNTER — Ambulatory Visit (INDEPENDENT_AMBULATORY_CARE_PROVIDER_SITE_OTHER): Payer: Commercial Managed Care - PPO | Admitting: Legal Medicine

## 2020-05-01 ENCOUNTER — Other Ambulatory Visit: Payer: Self-pay

## 2020-05-01 VITALS — BP 122/84 | HR 74 | Temp 97.8°F | Resp 18 | Ht 66.0 in | Wt 210.2 lb

## 2020-05-01 DIAGNOSIS — H8110 Benign paroxysmal vertigo, unspecified ear: Secondary | ICD-10-CM | POA: Insufficient documentation

## 2020-05-01 DIAGNOSIS — H8113 Benign paroxysmal vertigo, bilateral: Secondary | ICD-10-CM

## 2020-05-01 HISTORY — DX: Benign paroxysmal vertigo, unspecified ear: H81.10

## 2020-05-01 MED ORDER — DIAZEPAM 2 MG PO TABS: 2.0000 mg | ORAL_TABLET | Freq: Four times a day (QID) | ORAL | 0 refills | Status: DC | PRN

## 2020-05-01 NOTE — Patient Instructions (Signed)

## 2020-05-01 NOTE — Progress Notes (Signed)
Subjective:  Patient ID: Diane Chang, female    DOB: Jan 04, 1941  Age: 79 y.o. MRN: [id number]  Chief Complaint  Patient presents with   [facility] records reviewed  HPI: patient has positional vertigo and went to ER 7/21 for vertigo and workup was negative.  She is still dizzy with getting up even with meclizine   Current Outpatient Medications on File Prior to Visit  Medication Sig Dispense Refill   alendronate (FOSAMAX) 70 MG tablet TAKE ONE TABLET BY MOUTH WEEKLY 4 tablet 10   atorvastatin (LIPITOR) 40 MG tablet TAKE 1 TABLET BY MOUTH EVERY DAY 30 tablet 6   lisinopril-hydrochlorothiazide (ZESTORETIC) 20-12.5 MG tablet TAKE 1 TABLET BY MOUTH EVERY DAY 30 tablet 6   meclizine (ANTIVERT) 25 MG tablet Take 25 mg by mouth 3 (three) times daily as needed.     metFORMIN (GLUCOPHAGE) 500 MG tablet Take 1 tablet (500 mg total) by mouth daily with breakfast. 30 tablet 6   naproxen (NAPROSYN) 500 MG tablet Take 1 tablet (500 mg total) by mouth 2 (two) times daily. 60 tablet 5   omeprazole (PRILOSEC) 40 MG capsule TAKE 1 CAPSULE BY MOUTH TWICE A DAY BEFORE A MEAL 60 capsule 4   No current facility-administered medications on file prior to visit.   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 No pertinent surgical history.  History reviewed. No pertinent family history. Social History   Socioeconomic History   Marital status: Married    Spouse name: Not on file   Number of children: Not on file   Years of education: Not on file   Highest education level: Not on file  Occupational History   Occupation: packing  Tobacco Use   Smoking status: Former Smoker    Types: Cigarettes    Quit date: 1972    Years since quitting: 49.6   Smokeless tobacco: Never Used  Substance and Sexual Activity   Alcohol  use: Never   Drug use: Never   Sexual activity: Not Currently  Other Topics Concern   Not on file  Social History Narrative   Not on file   Social Determinants of Health   Financial Resource Strain:    Difficulty of Paying Living Expenses:   Food Insecurity:    Worried About Charity fundraiser in the Last Year:    Arboriculturist in the Last Year:   Transportation Needs:    Film/video editor (Medical):    Lack of Transportation (Non-Medical):   Physical Activity:    Days of Exercise per Week:    Minutes of Exercise per Session:   Stress:    Feeling of Stress :   Social Connections:    Frequency of Communication with Friends and Family:    Frequency of Social Gatherings with Friends and Family:    Attends Religious Services:    Active Member of Clubs or Organizations:    Attends Archivist Meetings:    Marital Status:     Review of Systems  Constitutional: Negative.   HENT: Negative.   Eyes: Negative.   Respiratory: Negative.   Cardiovascular: Negative.   Gastrointestinal: Negative.   Genitourinary: Negative.   Musculoskeletal: Negative.   Skin: Negative.   Neurological: Positive for dizziness.  Psychiatric/Behavioral: Negative.      Objective:  BP  122/84    Pulse 74    Temp 97.8 F (36.6 C)    Resp 18    Ht 5\' 6"  (1.676 m)    Wt (!) 210 lb 3.2 oz (95.3 kg)    SpO2 96%    BMI 33.93 kg/m   BP/Weight 05/01/2020 04/17/2020 11/18/9415  Systolic BP 408 144 818  Diastolic BP 84 60 80  Wt. (Lbs) 210.2 207.4 210  BMI 33.93 35.6 37.21    Physical Exam Vitals reviewed.  Constitutional:      Appearance: Normal appearance.  HENT:     Head: Normocephalic and atraumatic.     Right Ear: Tympanic membrane, ear canal and external ear normal.     Left Ear: Tympanic membrane, ear canal and external ear normal.     Nose: Nose normal.     Mouth/Throat:     Mouth: Mucous membranes are moist.  Eyes:     Extraocular Movements: Extraocular  movements intact.     Conjunctiva/sclera: Conjunctivae normal.     Pupils: Pupils are equal, round, and reactive to light.     Comments: No nystagmus  Cardiovascular:     Rate and Rhythm: Normal rate and regular rhythm.     Pulses: Normal pulses.     Heart sounds: Normal heart sounds.  Pulmonary:     Effort: Pulmonary effort is normal.     Breath sounds: Normal breath sounds.  Skin:    Capillary Refill: Capillary refill takes less than 2 seconds.  Neurological:     Mental Status: She is alert and oriented to person, place, and time.     Comments: Positive rhomberg       Lab Results  Component Value Date   WBC 5.7 04/17/2020   HGB 14.1 04/17/2020   HCT 43.1 04/17/2020   PLT 301 04/17/2020   GLUCOSE 151 (H) 04/17/2020   CHOL 152 04/17/2020   TRIG 189 (H) 04/17/2020   HDL 37 (L) 04/17/2020   LDLCALC 83 04/17/2020   ALT 30 04/17/2020   AST 27 04/17/2020   NA 141 04/17/2020   K 4.6 04/17/2020   CL 102 04/17/2020   CREATININE 0.82 04/17/2020   BUN 15 04/17/2020   CO2 25 04/17/2020   TSH 2.560 04/17/2020   HGBA1C 7.0 (H) 04/17/2020   MICROALBUR 30 04/17/2020      Assessment & Plan:   1. Benign paroxysmal positional vertigo due to bilateral vestibular disorder - diazepam (VALIUM) 2 MG tablet; Take 1 tablet (2 mg total) by mouth every 6 (six) hours as needed (dizziness).  Dispense: 30 tablet; Refill: 0  Continued vertigo on meclizine, try diazepam that worked well in ER.  Note to be out work [employer] week.  Meds ordered this encounter  Medications   diazepam (VALIUM) 2 MG tablet    Sig: Take 1 tablet (2 mg total) by mouth every 6 (six) hours as needed (dizziness).    Dispense:  30 tablet    Refill:  0       Follow-up: Return if symptoms worsen or fail to improve.  An After Visit Summary was printed and given to the patient.  [place] [phone]

## 2020-06-15 ENCOUNTER — Other Ambulatory Visit: Payer: Self-pay | Admitting: Legal Medicine

## 2020-06-15 DIAGNOSIS — K2100 Gastro-esophageal reflux disease with esophagitis, without bleeding: Secondary | ICD-10-CM

## 2020-08-18 ENCOUNTER — Encounter: Payer: Self-pay | Admitting: Legal Medicine

## 2020-08-18 ENCOUNTER — Other Ambulatory Visit: Payer: Self-pay

## 2020-08-18 ENCOUNTER — Ambulatory Visit (INDEPENDENT_AMBULATORY_CARE_PROVIDER_SITE_OTHER): Payer: Commercial Managed Care - PPO | Admitting: Legal Medicine

## 2020-08-18 VITALS — BP 154/80 | HR 52 | Temp 97.4°F | Resp 16 | Ht 63.0 in | Wt 205.0 lb

## 2020-08-18 DIAGNOSIS — E1159 Type 2 diabetes mellitus with other circulatory complications: Secondary | ICD-10-CM | POA: Diagnosis not present

## 2020-08-18 DIAGNOSIS — I10 Essential (primary) hypertension: Secondary | ICD-10-CM | POA: Diagnosis not present

## 2020-08-18 DIAGNOSIS — E782 Mixed hyperlipidemia: Secondary | ICD-10-CM | POA: Diagnosis not present

## 2020-08-18 DIAGNOSIS — R072 Precordial pain: Secondary | ICD-10-CM | POA: Diagnosis not present

## 2020-08-18 DIAGNOSIS — M810 Age-related osteoporosis without current pathological fracture: Secondary | ICD-10-CM | POA: Diagnosis not present

## 2020-08-18 DIAGNOSIS — E1169 Type 2 diabetes mellitus with other specified complication: Secondary | ICD-10-CM

## 2020-08-18 DIAGNOSIS — I152 Hypertension secondary to endocrine disorders: Secondary | ICD-10-CM | POA: Diagnosis not present

## 2020-08-18 DIAGNOSIS — R079 Chest pain, unspecified: Secondary | ICD-10-CM

## 2020-08-18 DIAGNOSIS — Z6835 Body mass index (BMI) 35.0-35.9, adult: Secondary | ICD-10-CM | POA: Diagnosis not present

## 2020-08-18 DIAGNOSIS — E119 Type 2 diabetes mellitus without complications: Secondary | ICD-10-CM

## 2020-08-18 DIAGNOSIS — E669 Obesity, unspecified: Secondary | ICD-10-CM | POA: Diagnosis not present

## 2020-08-18 DIAGNOSIS — K2100 Gastro-esophageal reflux disease with esophagitis, without bleeding: Secondary | ICD-10-CM

## 2020-08-18 HISTORY — DX: Chest pain, unspecified: R07.9

## 2020-08-18 NOTE — Progress Notes (Signed)
Subjective:  Patient ID: Diane Chang, female    DOB: January 03, 1941  Age: 79 y.o. MRN: [id number]  Chief Complaint  Patient presents with  . Hyperlipidemia  . Hypertension    HPI: chronc visit  Patient presents for follow up of hypertension.  Patient tolerating lisinopril/HCTZ well with side effects.  Patient was diagnosed with hypertension 2010 so has been treated for hypertension for 10 years.Patient is working on maintaining diet and exercise regimen and follows up as directed. Complication include none.  Patient presents with hyperlipidemia.  Compliance with treatment has been good; patient takes medicines as directed, maintains low cholesterol diet, follows up as directed, and maintains exercise regimen.  Patient is using atorvastatin without problems.  Pituitary adenoma stable seen at [facility].  Patient present with type 2 diabetes.  Specifically, this is type 2, non insulin  requiring diabetes, complicated by hypertension and hypercholesterolemia.  Compliance with treatment has been good; patient take medicines as directed, maintains diet and exercise regimen, follows up as directed, and is keeping glucose diary.  Date of  diagnosis 2010.  Depression screen has been performed.Tobacco screen nonsmoker. Current medicines for diabetes metformin.  Patient is on lisinopril for renal protection and atorvastatin for cholesterol control.  Patient performs foot exams daily and last ophthalmologic exam was no.  Patient gets SOB at times and precordial chest pain with bending.  No radiation or sweats.   Current Outpatient Medications on File Prior to Visit  Medication Sig Dispense Refill  . alendronate (FOSAMAX) 70 MG tablet TAKE ONE TABLET BY MOUTH WEEKLY 4 tablet 10  . atorvastatin (LIPITOR) 40 MG tablet TAKE 1 TABLET BY MOUTH EVERY DAY 30 tablet 6  . diazepam (VALIUM) 2 MG tablet Take 1 tablet (2 mg total) by mouth every 6 (six) hours as needed (dizziness). 30 tablet 0  .  lisinopril-hydrochlorothiazide (ZESTORETIC) 20-12.5 MG tablet TAKE 1 TABLET BY MOUTH EVERY DAY 30 tablet 6  . meclizine (ANTIVERT) 25 MG tablet Take 25 mg by mouth 3 (three) times daily as needed.    . metFORMIN (GLUCOPHAGE) 500 MG tablet Take 1 tablet (500 mg total) by mouth daily with breakfast. 30 tablet 6  . naproxen (NAPROSYN) 500 MG tablet Take 1 tablet (500 mg total) by mouth 2 (two) times daily. 60 tablet 5  . omeprazole (PRILOSEC) 40 MG capsule TAKE 1 CAPSULE BY MOUTH TWICE A DAY BEFORE A MEAL 60 capsule 6   No current facility-administered medications on file prior to visit.   Past Medical History:  Diagnosis Date  . 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 DM type 2 with diabetic mixed hyperlipidemia ([place])   . Mixed hyperlipidemia    History reviewed. No pertinent surgical history.  History reviewed. No pertinent family history. Social History   Socioeconomic History  . Marital status: Married    Spouse name: Not on file  . Number of children: Not on file  . Years of education: Not on file  . Highest education level: Not on file  Occupational History  . Occupation: packing  Tobacco Use  . Smoking status: Former Smoker    Types: Cigarettes    Quit date: 1972    Years since quitting: 49.8  . Smokeless tobacco: Never Used  Substance and Sexual Activity  . Alcohol use: Never  . Drug use: Never  . Sexual activity: Not Currently  Other Topics Concern  . Not on file  Social History Narrative  . Not on file   Social  Determinants of Health   Financial Resource Strain:   . Difficulty of Paying Living Expenses: Not on file  Food Insecurity:   . Worried About Charity fundraiser in the Last Year: Not on file  . Ran Out of Food in the Last Year: Not on file  Transportation Needs:   . Lack of Transportation (Medical): Not on file  . Lack of Transportation (Non-Medical): Not on file  Physical Activity:   . Days of Exercise per Week: Not on  file  . Minutes of Exercise per Session: Not on file  Stress:   . Feeling of Stress : Not on file  Social Connections:   . Frequency of Communication with Friends and Family: Not on file  . Frequency of Social Gatherings with Friends and Family: Not on file  . Attends Religious Services: Not on file  . Active Member of Clubs or Organizations: Not on file  . Attends Archivist Meetings: Not on file  . Marital Status: Not on file    Review of Systems  Constitutional: Negative for activity change, appetite change and fatigue.  HENT: Negative for congestion and dental problem.   Eyes: Negative for pain.  Respiratory: Negative for cough, choking and shortness of breath.   Cardiovascular: Negative for chest pain, palpitations and leg swelling.  Gastrointestinal: Negative.   Endocrine: Negative.   Genitourinary: Negative for difficulty urinating and dyspareunia.  Musculoskeletal: Positive for arthralgias.  Skin: Negative.   Neurological: Negative.  Negative for dizziness.  Psychiatric/Behavioral: Negative.      Objective:  BP (!) 154/80   Pulse (!) 52   Temp (!) 97.4 F (36.3 C)   Resp 16   Ht 5\' 3"  (1.6 m)   Wt 205 lb (93 kg)   SpO2 98%   BMI 36.31 kg/m   BP/Weight 08/18/2020 05/01/2020 8/46/6599  Systolic BP 357 017 793  Diastolic BP 80 84 60  Wt. (Lbs) 205 210.2 207.4  BMI 36.31 33.93 35.6    Physical Exam Vitals reviewed.  Constitutional:      Appearance: Normal appearance.  HENT:     Right Ear: Tympanic membrane normal.     Left Ear: Tympanic membrane normal.     Mouth/Throat:     Mouth: Mucous membranes are moist.     Pharynx: Oropharynx is clear.  Eyes:     Extraocular Movements: Extraocular movements intact.     Conjunctiva/sclera: Conjunctivae normal.     Pupils: Pupils are equal, round, and reactive to light.  Cardiovascular:     Rate and Rhythm: Normal rate and regular rhythm.     Pulses: Normal pulses.     Heart sounds: Normal heart  sounds.  Pulmonary:     Effort: Pulmonary effort is normal.     Breath sounds: Normal breath sounds.  Abdominal:     General: Abdomen is flat. Bowel sounds are normal.     Palpations: Abdomen is soft.  Musculoskeletal:        General: Normal range of motion.     Cervical back: Normal range of motion and neck supple.  Skin:    General: Skin is warm and dry.     Capillary Refill: Capillary refill takes less than 2 seconds.  Neurological:     General: No focal deficit present.     Mental Status: She is alert and oriented to person, place, and time. Mental status is at baseline.  Psychiatric:        Mood and Affect: Mood normal.  Thought Content: Thought content normal.        Judgment: Judgment normal.     Diabetic Foot Exam - Simple   Simple Foot Form Diabetic Foot exam was performed with the following findings: Yes 08/18/2020  8:05 AM  Visual Inspection No deformities, no ulcerations, no other skin breakdown bilaterally: Yes Sensation Testing Intact to touch and monofilament testing bilaterally: Yes Pulse Check Posterior Tibialis and Dorsalis pulse intact bilaterally: Yes Comments    EKG: sinus bradycardia rate 44, PR 169msec, QRS 61msec, QTc478, axis -27 degrees, LAD with criteria for LVH  Lab Results  Component Value Date   WBC 5.7 04/17/2020   HGB 14.1 04/17/2020   HCT 43.1 04/17/2020   PLT 301 04/17/2020   GLUCOSE 151 (H) 04/17/2020   CHOL 152 04/17/2020   TRIG 189 (H) 04/17/2020   HDL 37 (L) 04/17/2020   LDLCALC 83 04/17/2020   ALT 30 04/17/2020   AST 27 04/17/2020   NA 141 04/17/2020   K 4.6 04/17/2020   CL 102 04/17/2020   CREATININE 0.82 04/17/2020   BUN 15 04/17/2020   CO2 25 04/17/2020   TSH 2.560 04/17/2020   HGBA1C 7.0 (H) 04/17/2020   MICROALBUR 30 04/17/2020      Assessment & Plan:   1. Benign hypertension An individual hypertenwellsion care plan was established and reinforced today.  The patient's status was assessed using clinical  findings on exam and labs or diagnostic tests. The patient's success at meeting treatment goals on disease specific evidence-based guidelines and found to be well controlled. SELF MANAGEMENT: The patient and I together assessed ways to personally work towards obtaining the recommended goals. RECOMMENDATIONS: avoid decongestants found in common cold remedies, decrease consumption of alcohol, perform routine monitoring of BP with home BP cuff, exercise, reduction of dietary salt, take medicines as prescribed, try not to miss doses and quit smoking.  Regular exercise and maintaining a healthy weight is needed.  Stress reduction may help. A CLINICAL SUMMARY including written plan identify barriers to care unique to individual due to social or financial issues.  We attempt to mutually creat solutions for individual and family understanding. EKG shows bradycardia and LVH- she is referred to cardiology  2. Mixed hyperlipidemia AN INDIVIDUAL CARE PLAN for hyperlipidemia/ cholesterol was established and reinforced today.  The patient's status was assessed using clinical findings on exam, lab and other diagnostic tests. The patient's disease status was assessed based on evidence-based guidelines and found to be well controlled. MEDICATIONS were reviewed. SELF MANAGEMENT GOALS have been discussed and patient's success at attaining the goal of low cholesterol was assessed. RECOMMENDATION given include regular exercise 3 days a week and low cholesterol/low fat diet. CLINICAL SUMMARY including written plan to identify barriers unique to the patient due to social or economic  reasons was discussed.  3. Gastroesophageal reflux disease with esophagitis without hemorrhage Plan of care was formulated today.  She is doing well.  A plan of care was formulated using patient exam, tests and other sources to optimize care using evidence based information.  Recommend no smoking, no eating after supper, avoid fatty foods,  elevate Head of bed, avoid tight fitting clothing.  Continue on OTC.  4. Obesity, diabetes, and hypertension syndrome ([place]) An individual care plan for diabetes was established and reinforced today.  The patient's status was assessed using clinical findings on exam, labs and diagnostic testing. Patient success at meeting goals based on disease specific evidence-based guidelines and found to be good controlled. Medications were  assessed and patient's understanding of the medical issues , including barriers were assessed. Recommend adherence to a diabetic diet, a graduated exercise program, HgbA1c level is checked quarterly, and urine microalbumin performed yearly .  Annual mono-filament sensation testing performed. Lower blood pressure and control hyperlipidemia is important. Get annual eye exams and annual flu shots and smoking cessation discussed.  Self management goals were discussed.  5. 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 BMI 35.0-35.9,adult An individualize plan was formulated for obesity using patient history and physical exam to encourage weight loss.  An evidence based program was formulated.  Patient is to cut portion size with meals and to plan physical exercise 3 days a week at least 20 minutes.  Weight watchers and other programs are helpful.  Planned amount of weight loss 10 lbs.         I spent 30 dedicated to the care of this patient on the date of this encounter to include face-to-face time with the patient, as well as  Preparing to see the patient (eg, review of tests). Obtaining and/or reviewing separately obtained history. Performing a medically appropriate examination and/or evaluation. Counseling and educating the patient/family/caregiver. Ordering medications, tests, or procedures. Communicating with other health care professionals (not separately reported). Documenting clinical information in the  electronic or other health record. Independently interpreting results (not separately reported) and communicating results to the patient/family/caregiver.  My nursing staff have aided in the documentation of this note on the behalf of Reinaldo Meeker, MD,as directed by  Reinaldo Meeker, MD and thoroughly reviewed by Reinaldo Meeker, MD.  Follow-up: No follow-ups on file.  An After Visit Summary was printed and given to the patient.  Reinaldo Meeker, MD [facility] [phone]

## 2020-08-19 LAB — CBC WITH DIFFERENTIAL/PLATELET
Basophils Absolute: 0.1 10*3/uL (ref 0.0–0.2)
Basos: 1 %
EOS (ABSOLUTE): 0.3 10*3/uL (ref 0.0–0.4)
Eos: 5 %
Hematocrit: 40.8 % (ref 34.0–46.6)
Hemoglobin: 14.2 g/dL (ref 11.1–15.9)
Immature Grans (Abs): 0 10*3/uL (ref 0.0–0.1)
Immature Granulocytes: 0 %
Lymphocytes Absolute: 2.8 10*3/uL (ref 0.7–3.1)
Lymphs: 48 %
MCH: 33.2 pg — ABNORMAL HIGH (ref 26.6–33.0)
MCHC: 34.8 g/dL (ref 31.5–35.7)
MCV: 95 fL (ref 79–97)
Monocytes Absolute: 0.4 10*3/uL (ref 0.1–0.9)
Monocytes: 7 %
Neutrophils Absolute: 2.3 10*3/uL (ref 1.4–7.0)
Neutrophils: 39 %
Platelets: 251 10*3/uL (ref 150–450)
RBC: 4.28 x10E6/uL (ref 3.77–5.28)
RDW: 12.1 % (ref 11.7–15.4)
WBC: 5.8 10*3/uL (ref 3.4–10.8)

## 2020-08-19 LAB — COMPREHENSIVE METABOLIC PANEL
ALT: 28 IU/L (ref 0–32)
AST: 29 IU/L (ref 0–40)
Albumin/Globulin Ratio: 1.5 (ref 1.2–2.2)
Albumin: 4.2 g/dL (ref 3.7–4.7)
Alkaline Phosphatase: 58 IU/L (ref 44–121)
BUN/Creatinine Ratio: 22 (ref 12–28)
BUN: 20 mg/dL (ref 8–27)
Bilirubin Total: 0.4 mg/dL (ref 0.0–1.2)
CO2: 25 mmol/L (ref 20–29)
Calcium: 10 mg/dL (ref 8.7–10.3)
Chloride: 104 mmol/L (ref 96–106)
Creatinine, Ser: 0.89 mg/dL (ref 0.57–1.00)
GFR calc Af Amer: 72 mL/min/{1.73_m2} (ref >59)
GFR calc non Af Amer: 63 mL/min/{1.73_m2} (ref >59)
Globulin, Total: 2.8 g/dL (ref 1.5–4.5)
Glucose: 133 mg/dL — ABNORMAL HIGH (ref 65–99)
Potassium: 4.4 mmol/L (ref 3.5–5.2)
Sodium: 143 mmol/L (ref 134–144)
Total Protein: 7 g/dL (ref 6.0–8.5)

## 2020-08-19 LAB — CARDIOVASCULAR RISK ASSESSMENT

## 2020-08-19 LAB — LIPID PANEL
Chol/HDL Ratio: 3.2 ratio (ref 0.0–4.4)
Cholesterol, Total: 131 mg/dL (ref 100–199)
HDL: 41 mg/dL (ref >39)
LDL Chol Calc (NIH): 76 mg/dL (ref 0–99)
Triglycerides: 71 mg/dL (ref 0–149)
VLDL Cholesterol Cal: 14 mg/dL (ref 5–40)

## 2020-08-19 LAB — HEMOGLOBIN A1C
Est. average glucose Bld gHb Est-mCnc: 171 mg/dL
Hgb A1c MFr Bld: 7.6 % — ABNORMAL HIGH (ref 4.8–5.6)

## 2020-08-23 DIAGNOSIS — E1169 Type 2 diabetes mellitus with other specified complication: Secondary | ICD-10-CM | POA: Insufficient documentation

## 2020-08-23 DIAGNOSIS — M810 Age-related osteoporosis without current pathological fracture: Secondary | ICD-10-CM | POA: Insufficient documentation

## 2020-08-25 ENCOUNTER — Other Ambulatory Visit: Payer: Self-pay

## 2020-08-25 ENCOUNTER — Encounter: Payer: Self-pay | Admitting: Cardiology

## 2020-08-25 ENCOUNTER — Ambulatory Visit (INDEPENDENT_AMBULATORY_CARE_PROVIDER_SITE_OTHER): Payer: Commercial Managed Care - PPO | Admitting: Cardiology

## 2020-08-25 VITALS — BP 142/82 | HR 80 | Ht 63.0 in | Wt 207.6 lb

## 2020-08-25 DIAGNOSIS — E669 Obesity, unspecified: Secondary | ICD-10-CM | POA: Diagnosis not present

## 2020-08-25 DIAGNOSIS — R011 Cardiac murmur, unspecified: Secondary | ICD-10-CM

## 2020-08-25 DIAGNOSIS — R072 Precordial pain: Secondary | ICD-10-CM

## 2020-08-25 DIAGNOSIS — R0789 Other chest pain: Secondary | ICD-10-CM | POA: Insufficient documentation

## 2020-08-25 DIAGNOSIS — R9431 Abnormal electrocardiogram [ECG] [EKG]: Secondary | ICD-10-CM | POA: Diagnosis not present

## 2020-08-25 DIAGNOSIS — E1169 Type 2 diabetes mellitus with other specified complication: Secondary | ICD-10-CM

## 2020-08-25 DIAGNOSIS — Z6835 Body mass index (BMI) 35.0-35.9, adult: Secondary | ICD-10-CM | POA: Diagnosis not present

## 2020-08-25 DIAGNOSIS — I10 Essential (primary) hypertension: Secondary | ICD-10-CM | POA: Diagnosis not present

## 2020-08-25 DIAGNOSIS — E782 Mixed hyperlipidemia: Secondary | ICD-10-CM

## 2020-08-25 DIAGNOSIS — E1159 Type 2 diabetes mellitus with other circulatory complications: Secondary | ICD-10-CM | POA: Diagnosis not present

## 2020-08-25 DIAGNOSIS — I152 Hypertension secondary to endocrine disorders: Secondary | ICD-10-CM | POA: Diagnosis not present

## 2020-08-25 HISTORY — DX: Cardiac murmur, unspecified: R01.1

## 2020-08-25 HISTORY — DX: Abnormal electrocardiogram (ECG) (EKG): R94.31

## 2020-08-25 HISTORY — DX: Other chest pain: R07.89

## 2020-08-25 NOTE — Progress Notes (Signed)
Cardiology Office Note:    Date:  08/25/2020   ID:  Diane Chang, DOB [birth year], MRN [id number]  PCP:  Lillard Anes, MD  Cardiologist:  Jenean Lindau, MD   Referring MD: Lillard Anes,*    ASSESSMENT:    1. Mixed hyperlipidemia   2. Benign hypertension   3. Obesity, diabetes, and hypertension syndrome ([place])   4. BMI 35.0-35.9,adult   5. Chest discomfort   6. Cardiac murmur   7. Abnormal EKG    PLAN:    In order of problems listed above:  1. Primary prevention stressed with the patient.  Importance of compliance with diet medication stressed and she vocalized understanding. 2. Chest discomfort: Atypical in nature however in view of risk factors we will do a Lexiscan sestamibi.  She is concerned about the symptoms.  She was advised to take a coated aspirin on a daily basis till the tests were done. 3. Essential essential hypertension: Blood pressure stable and diet was emphasized. 4. Mixed dyslipidemia: Lipids are fine and there were reviewed from the [facility] sheet.  I advised her of this. 5. Diabetes mellitus and obesity: Hemoglobin A1c was elevated I discussed test extensively importance of losing weight stressed and she promises to do better. 6. Cardiac murmur: Echocardiogram will be done to assess murmur heard on auscultation. 7. Patient will be seen in follow-up appointment in 2 months or earlier if the patient has any concerns    Medication Adjustments/Labs and Tests Ordered: Current medicines are reviewed at length with the patient today.  Concerns regarding medicines are outlined above.  No orders of the defined types were placed in this encounter.  No orders of the defined types were placed in this encounter.    History of Present Illness:    Diane Chang is a 79 y.o. female who is being seen today for the evaluation of chest discomfort at the request of Lillard Anes,*.  Patient has past medical history of essential hypertension  dyslipidemia diabetes mellitus and obesity.  She is an active lady she walks on a regular basis but does not exercise on a regular basis.  She gives history of chest discomfort at times.  Is not related to exertion.  No radiation to the neck or to the arms.  At the time of my evaluation, the patient is alert awake oriented and in no distress.  Past Medical History:  Diagnosis Date  . 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 Benign hypertension 12/17/2019  . Benign paroxysmal positional vertigo 05/01/2020  . BMI 35.0-35.9,adult 04/17/2020  . Chest pain 08/18/2020  . DM type 2 with diabetic mixed hyperlipidemia (Diane Chang)   . GERD (gastroesophageal reflux disease) 12/17/2019  . Mixed hyperlipidemia   . Obesity, diabetes, and hypertension syndrome (Diane Chang) 12/17/2019  . 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 Osteoporosis 12/17/2019  . Pituitary adenoma ([place]) 12/17/2019    Past Surgical History:  Procedure Laterality Date  . CHOLECYSTECTOMY      Current Medications: Current Meds  Medication Sig  . alendronate (FOSAMAX) 70 MG tablet TAKE ONE TABLET BY MOUTH WEEKLY  . atorvastatin (LIPITOR) 40 MG tablet TAKE 1 TABLET BY MOUTH EVERY DAY  . lisinopril-hydrochlorothiazide (ZESTORETIC) 20-12.5 MG tablet TAKE 1 TABLET BY MOUTH EVERY DAY  . metFORMIN (GLUCOPHAGE) 500 MG tablet Take 1 tablet (500 mg total) by mouth daily with breakfast.  . naproxen (NAPROSYN) 500 MG tablet Take 1 tablet (500 mg total) by mouth 2 (two) times daily.  Marland Kitchen  omeprazole (PRILOSEC) 40 MG capsule TAKE 1 CAPSULE BY MOUTH TWICE A DAY BEFORE A MEAL     Allergies:   Patient has no known allergies.   Social History   Socioeconomic History  . Marital status: Married    Spouse name: Not on file  . Number of children: Not on file  . Years of education: Not on file  . Highest education level: Not on file  Occupational History  . Occupation: packing  Tobacco Use  . Smoking status: Former Smoker    Types:  Cigarettes    Quit date: 1972    Years since quitting: 49.9  . Smokeless tobacco: Never Used  Substance and Sexual Activity  . Alcohol use: Never  . Drug use: Never  . Sexual activity: Not Currently  Other Topics Concern  . Not on file  Social History Narrative  . Not on file   Social Determinants of Health   Financial Resource Strain:   . Difficulty of Paying Living Expenses: Not on file  Food Insecurity:   . Worried About Charity fundraiser in the Last Year: Not on file  . Ran Out of Food in the Last Year: Not on file  Transportation Needs:   . Lack of Transportation (Medical): Not on file  . Lack of Transportation (Non-Medical): Not on file  Physical Activity:   . Days of Exercise per Week: Not on file  . Minutes of Exercise per Session: Not on file  Stress:   . Feeling of Stress : Not on file  Social Connections:   . Frequency of Communication with Friends and Family: Not on file  . Frequency of Social Gatherings with Friends and Family: Not on file  . Attends Religious Services: Not on file  . Active Member of Clubs or Organizations: Not on file  . Attends Archivist Meetings: Not on file  . Marital Status: Not on file     Family History: The patient's family history includes Heart attack in her sister.  ROS:   Please see the history of present illness.    All other systems reviewed and are negative.  EKGs/Labs/Other Studies Reviewed:    The following studies were reviewed today: EKG reveals sinus rhythm with nonspecific ST-T changes   Recent Labs: 04/17/2020: TSH 2.560 08/18/2020: ALT 28; BUN 20; Creatinine, Ser 0.89; Hemoglobin 14.2; Platelets 251; Potassium 4.4; Sodium 143  Recent Lipid Panel    Component Value Date/Time   CHOL 131 08/18/2020 0841   TRIG 71 08/18/2020 0841   HDL 41 08/18/2020 0841   CHOLHDL 3.2 08/18/2020 0841   LDLCALC 76 08/18/2020 0841    Physical Exam:    VS:  BP (!) 142/82   Pulse 80   Ht 5\' 3"  (1.6 m)   Wt  207 lb 9.6 oz (94.2 kg)   SpO2 94%   BMI 36.77 kg/m     Wt Readings from Last 3 Encounters:  08/25/20 207 lb 9.6 oz (94.2 kg)  08/18/20 205 lb (93 kg)  05/01/20 (!) 210 lb 3.2 oz (95.3 kg)     GEN: Patient is in no acute distress HEENT: Normal NECK: No JVD; No carotid bruits LYMPHATICS: No lymphadenopathy CARDIAC: S1 S2 regular, 2/6 systolic murmur at the apex. RESPIRATORY:  Clear to auscultation without rales, wheezing or rhonchi  ABDOMEN: Soft, non-tender, non-distended MUSCULOSKELETAL:  No edema; No deformity  SKIN: Warm and dry NEUROLOGIC:  Alert and oriented x 3 PSYCHIATRIC:  Normal affect    Signed,  Jenean Lindau, MD  08/25/2020 2:58 PM    [place]

## 2020-08-25 NOTE — Patient Instructions (Addendum)
Medication Instructions:  No medication changes. *If you need a refill on your cardiac medications before your next appointment, please call your pharmacy*   Lab Work: None ordered If you have labs (blood work) drawn today and your tests are completely normal, you will receive your results only by: . MyChart Message (if you have MyChart) OR . A paper copy in the mail If you have any lab test that is abnormal or we need to change your treatment, we will call you to review the results.   Testing/Procedures: Your physician has requested that you have an echocardiogram. Echocardiography is a painless test that uses sound waves to create images of your heart. It provides your doctor with information about the size and shape of your heart and how well your heart's chambers and valves are working. This procedure takes approximately one hour. There are no restrictions for this procedure.  Your physician has requested that you have a lexiscan myoview. For further information please visit [email or web address]. Please follow instruction sheet, as given.  The test will take approximately 3 to 4 hours to complete; you may bring reading material.  If someone comes with you to your appointment, they will need to remain in the main lobby due to limited space in the testing area. **If you are pregnant or breastfeeding, please notify the nuclear lab prior to your appointment**  How to prepare for your Myocardial Perfusion Test: . Do not eat or drink 3 hours prior to your test, except you may have water. . Do not consume products containing caffeine (regular or decaffeinated) 12 hours prior to your test. (ex: coffee, chocolate, sodas, tea). . Do bring a list of your current medications with you.  If not listed below, you may take your medications as normal. . Do wear comfortable clothes (no dresses or overalls) and walking shoes, tennis shoes preferred (No heels or open toe shoes are allowed). . Do NOT wear  cologne, perfume, aftershave, or lotions (deodorant is allowed). . If these instructions are not followed, your test will have to be rescheduled.    Follow-Up: At [facility], you and your health needs are our priority.  As part of our continuing mission to provide you with exceptional heart care, we have created designated Provider Care Teams.  These Care Teams include your primary Cardiologist (physician) and Advanced Practice Providers (APPs -  Physician Assistants and Nurse Practitioners) who all work together to provide you with the care you need, when you need it.  We recommend signing up for the patient portal called "MyChart".  Sign up information is provided on this After Visit Summary.  MyChart is used to connect with patients for Virtual Visits (Telemedicine).  Patients are able to view lab/test results, encounter notes, upcoming appointments, etc.  Non-urgent messages can be sent to your provider as well.   To learn more about what you can do with MyChart, go to [email or web address].    Your next appointment:   2 month(s)  The format for your next appointment:   In Person  Provider:   Rajan Revankar, MD   Other Instructions  Cardiac Nuclear Scan  A cardiac nuclear scan is a test that is done to check the flow of blood to your heart. It is done when you are resting and when you are exercising. The test looks for problems such as:  Not enough blood reaching a portion of the heart.  The heart muscle not working as it should. You may need this test if:    You have heart disease.  You have had lab results that are not normal.  You have had heart surgery or a balloon procedure to open up blocked arteries (angioplasty).  You have chest pain.  You have shortness of breath. In this test, a special dye (tracer) is put into your bloodstream. The tracer will travel to your heart. A camera will then take pictures of your heart to see how the tracer moves through your  heart. This test is usually done at a hospital and takes 2-4 hours. Tell a doctor about:  Any allergies you have.  All medicines you are taking, including vitamins, herbs, eye drops, creams, and over-the-counter medicines.  Any problems you or family members have had with anesthetic medicines.  Any blood disorders you have.  Any surgeries you have had.  Any medical conditions you have.  Whether you are pregnant or may be pregnant. What are the risks? Generally, this is a safe test. However, problems may occur, such as:  Serious chest pain and heart attack. This is only a risk if the stress portion of the test is done.  Rapid heartbeat.  A feeling of warmth in your chest. This feeling usually does not last long.  Allergic reaction to the tracer. What happens before the test?  Ask your doctor about changing or stopping your normal medicines. This is important.  Follow instructions from your doctor about what you cannot eat or drink.  Remove your jewelry on the day of the test. What happens during the test? 1. An IV tube will be inserted into one of your veins. 2. Your doctor will give you a small amount of tracer through the IV tube. 3. You will wait for 20-40 minutes while the tracer moves through your bloodstream. 4. Your heart will be monitored with an electrocardiogram (ECG). 5. You will lie down on an exam table. 6. Pictures of your heart will be taken for about 15-20 minutes. 7. You may also have a stress test. For this test, one of these things may be done: ? You will be asked to exercise on a treadmill or a stationary bike. ? You will be given medicines that will make your heart work harder. This is done if you are unable to exercise. 8. When blood flow to your heart has peaked, a tracer will again be given through the IV tube. 9. After 20-40 minutes, you will get back on the exam table. More pictures will be taken of your heart. 10. Depending on the tracer that is  used, more pictures may need to be taken 3-4 hours later. 11. Your IV tube will be removed when the test is over. The test may vary among doctors and hospitals. What happens after the test? 1. Ask your doctor: ? Whether you can return to your normal schedule, including diet, activities, and medicines. ? Whether you should drink more fluids. This will help to remove the tracer from your body. Drink enough fluid to keep your pee (urine) pale yellow. 2. Ask your doctor, or the department that is doing the test: ? When will my results be ready? ? How will I get my results? Summary  A cardiac nuclear scan is a test that is done to check the flow of blood to your heart.  Tell your doctor whether you are pregnant or may be pregnant.  Before the test, ask your doctor about changing or stopping your normal medicines. This is important.  Ask your doctor whether you can return   to your normal activities. You may be asked to drink more fluids. This information is not intended to replace advice given to you by your health care provider. Make sure you discuss any questions you have with your health care provider. Document Revised: 01/13/2019 Document Reviewed: 03/09/2018 Elsevier Patient Education  2020 [employer]  Echocardiogram An echocardiogram is a procedure that uses painless sound waves (ultrasound) to produce an image of the heart. Images from an echocardiogram can provide important information about:  Signs of coronary artery disease (CAD).  Aneurysm detection. An aneurysm is a weak or damaged part of an artery wall that bulges out from the normal force of blood pumping through the body.  Heart size and shape. Changes in the size or shape of the heart can be associated with certain conditions, including heart failure, aneurysm, and CAD.  Heart muscle function.  Heart valve function.  Signs of a past heart attack.  Fluid buildup around the heart.  Thickening of the heart  muscle.  A tumor or infectious growth around the heart valves. Tell a health care provider about:  Any allergies you have.  All medicines you are taking, including vitamins, herbs, eye drops, creams, and over-the-counter medicines.  Any blood disorders you have.  Any surgeries you have had.  Any medical conditions you have.  Whether you are pregnant or may be pregnant. What are the risks? Generally, this is a safe procedure. However, problems may occur, including:  Allergic reaction to dye (contrast) that may be used during the procedure. What happens before the procedure? No specific preparation is needed. You may eat and drink normally. What happens during the procedure?    An IV tube may be inserted into one of your veins.  You may receive contrast through this tube. A contrast is an injection that improves the quality of the pictures from your heart.  A gel will be applied to your chest.  A wand-like tool (transducer) will be moved over your chest. The gel will help to transmit the sound waves from the transducer.  The sound waves will harmlessly bounce off of your heart to allow the heart images to be captured in real-time motion. The images will be recorded on a computer. The procedure may vary among health care providers and hospitals. What happens after the procedure?  You may return to your normal, everyday life, including diet, activities, and medicines, unless your health care provider tells you not to do that. Summary  An echocardiogram is a procedure that uses painless sound waves (ultrasound) to produce an image of the heart.  Images from an echocardiogram can provide important information about the size and shape of your heart, heart muscle function, heart valve function, and fluid buildup around your heart.  You do not need to do anything to prepare before this procedure. You may eat and drink normally.  After the echocardiogram is completed, you may  return to your normal, everyday life, unless your health care provider tells you not to do that. This information is not intended to replace advice given to you by your health care provider. Make sure you discuss any questions you have with your health care provider. Document Revised: 01/14/2019 Document Reviewed: 10/26/2016 Elsevier Patient Education  2020 Elsevier Inc.  

## 2020-08-29 ENCOUNTER — Telehealth: Payer: Self-pay

## 2020-08-29 NOTE — Telephone Encounter (Signed)
Spoke with the patient's husband, detailed instructions given. Asked to call back with any questions. S.Ameia Morency EMTP

## 2020-09-02 ENCOUNTER — Other Ambulatory Visit: Payer: Self-pay | Admitting: Legal Medicine

## 2020-09-05 ENCOUNTER — Ambulatory Visit (INDEPENDENT_AMBULATORY_CARE_PROVIDER_SITE_OTHER): Payer: Commercial Managed Care - PPO

## 2020-09-05 ENCOUNTER — Other Ambulatory Visit: Payer: Self-pay

## 2020-09-05 DIAGNOSIS — R9431 Abnormal electrocardiogram [ECG] [EKG]: Secondary | ICD-10-CM | POA: Diagnosis not present

## 2020-09-05 DIAGNOSIS — R072 Precordial pain: Secondary | ICD-10-CM | POA: Diagnosis not present

## 2020-09-05 DIAGNOSIS — R0789 Other chest pain: Secondary | ICD-10-CM

## 2020-09-05 LAB — MYOCARDIAL PERFUSION IMAGING
LV dias vol: 80 mL (ref 46–106)
LV sys vol: 31 mL
Peak HR: 93 {beats}/min
Rest HR: 40 {beats}/min
SDS: 3
SRS: 0
SSS: 3
TID: 0.98

## 2020-09-05 MED ORDER — TECHNETIUM TC 99M TETROFOSMIN IV KIT
25.5000 | PACK | Freq: Once | INTRAVENOUS | Status: AC | PRN
  Administered 2020-09-05: 25.5 via INTRAVENOUS

## 2020-09-05 MED ORDER — REGADENOSON 0.4 MG/5ML IV SOLN
0.4000 mg | Freq: Once | INTRAVENOUS | Status: AC
  Administered 2020-09-05: 0.4 mg via INTRAVENOUS

## 2020-09-05 MED ORDER — TECHNETIUM TC 99M TETROFOSMIN IV KIT
8.6000 | PACK | Freq: Once | INTRAVENOUS | Status: AC | PRN
  Administered 2020-09-05: 8.6 via INTRAVENOUS

## 2020-09-22 ENCOUNTER — Other Ambulatory Visit: Payer: Self-pay

## 2020-09-22 ENCOUNTER — Telehealth: Payer: Self-pay | Admitting: Cardiology

## 2020-09-22 ENCOUNTER — Ambulatory Visit (INDEPENDENT_AMBULATORY_CARE_PROVIDER_SITE_OTHER): Payer: Commercial Managed Care - PPO

## 2020-09-22 DIAGNOSIS — R011 Cardiac murmur, unspecified: Secondary | ICD-10-CM

## 2020-09-22 LAB — ECHOCARDIOGRAM COMPLETE
Area-P 1/2: 2.95 cm2
S' Lateral: 3.1 cm

## 2020-09-22 NOTE — Progress Notes (Signed)
Complete echocardiogram performed.  Jimmy Felishia Wartman RDCS, RVT  

## 2020-09-22 NOTE — Telephone Encounter (Signed)
Patient was in office today for Echo. She stated she does not want a phone call with her Echo results. She is requesting results be printed and mailed to her.  Please advise.

## 2020-09-25 NOTE — Telephone Encounter (Signed)
Results have been mailed

## 2020-10-15 ENCOUNTER — Other Ambulatory Visit: Payer: Self-pay | Admitting: Legal Medicine

## 2020-10-15 DIAGNOSIS — M160 Bilateral primary osteoarthritis of hip: Secondary | ICD-10-CM

## 2020-10-25 ENCOUNTER — Ambulatory Visit (INDEPENDENT_AMBULATORY_CARE_PROVIDER_SITE_OTHER): Payer: Managed Care, Other (non HMO) | Admitting: Cardiology

## 2020-10-25 ENCOUNTER — Encounter: Payer: Self-pay | Admitting: Cardiology

## 2020-10-25 ENCOUNTER — Other Ambulatory Visit: Payer: Self-pay

## 2020-10-25 VITALS — BP 130/70 | HR 80 | Ht 66.0 in | Wt 204.0 lb

## 2020-10-25 DIAGNOSIS — E782 Mixed hyperlipidemia: Secondary | ICD-10-CM

## 2020-10-25 DIAGNOSIS — E1169 Type 2 diabetes mellitus with other specified complication: Secondary | ICD-10-CM | POA: Diagnosis not present

## 2020-10-25 DIAGNOSIS — E669 Obesity, unspecified: Secondary | ICD-10-CM | POA: Diagnosis not present

## 2020-10-25 DIAGNOSIS — E1159 Type 2 diabetes mellitus with other circulatory complications: Secondary | ICD-10-CM | POA: Diagnosis not present

## 2020-10-25 DIAGNOSIS — I152 Hypertension secondary to endocrine disorders: Secondary | ICD-10-CM | POA: Diagnosis not present

## 2020-10-25 DIAGNOSIS — I10 Essential (primary) hypertension: Secondary | ICD-10-CM | POA: Diagnosis not present

## 2020-10-25 NOTE — Patient Instructions (Signed)

## 2020-10-25 NOTE — Progress Notes (Signed)
Cardiology Office Note:    Date:  10/25/2020   ID:  [name], DOB 1940/12/28, MRN [id number]  PCP:  Lillard Anes, MD  Cardiologist:  Jenean Lindau, MD   Referring MD: Lillard Anes,*    ASSESSMENT:    1. Benign hypertension   2. DM type 2 with diabetic mixed hyperlipidemia ([place])   3. Mixed hyperlipidemia   4. Obesity, diabetes, and hypertension syndrome ([place])    PLAN:    In order of problems listed above:  1. Primary prevention stressed with the patient.  Importance of compliance with diet medication stressed and she vocalized understanding 2. Essential hypertension: Blood pressure stable and diet was emphasized.  Lifestyle modification was urged. 3. Mixed dyslipidemia: Diet emphasized lipids were reviewed and they are fine.  I saw the KPN sheet. 4. Obesity and diabetes mellitus: Diet was emphasized.  Hemoglobin A1c is elevated and I questioned her about it.  She plans to do better.  She is going to get on an exercise program and weight loss by appropriately dieting.  I told her to cut down on the refined sugars and promises to comply. 5. Patient will be seen in follow-up appointment in 6 months or earlier if the patient has any concerns    Medication Adjustments/Labs and Tests Ordered: Current medicines are reviewed at length with the patient today.  Concerns regarding medicines are outlined above.  No orders of the defined types were placed in this encounter.  No orders of the defined types were placed in this encounter.    No chief complaint on file.    History of Present Illness:    Diane Chang is a 80 y.o. female.  Patient has past medical history of essential hypertension dyslipidemia and diabetes mellitus.  She underwent evaluation with stress test and echocardiogram.  The results are mentioned below.  She did well on these.  At the time of my evaluation, the patient is alert awake oriented and in no distress.  She leads a sedentary  lifestyle and does not exercise on a regular basis.  Past Medical History:  Diagnosis Date  . Abnormal EKG 08/25/2020  . 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 Benign hypertension 12/17/2019  . Benign paroxysmal positional vertigo 05/01/2020  . BMI 35.0-35.9,adult 04/17/2020  . Cardiac murmur 08/25/2020  . Chest discomfort 08/25/2020  . Chest pain 08/18/2020  . DM type 2 with diabetic mixed hyperlipidemia ([place])   . GERD (gastroesophageal reflux disease) 12/17/2019  . Mixed hyperlipidemia   . Obesity, diabetes, and hypertension syndrome ([place]) 12/17/2019  . 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 Osteoporosis 12/17/2019  . Pituitary adenoma ([place]) 12/17/2019    Past Surgical History:  Procedure Laterality Date  . CHOLECYSTECTOMY      Current Medications: No outpatient medications have been marked as taking for the 10/25/20 encounter (Office Visit) with Khori Underberg, Reita Cliche, MD.     Allergies:   Patient has no known allergies.   Social History   Socioeconomic History  . Marital status: Married    Spouse name: Not on file  . Number of children: Not on file  . Years of education: Not on file  . Highest education level: Not on file  Occupational History  . Occupation: packing  Tobacco Use  . Smoking status: Former Smoker    Types: Cigarettes    Quit date: 1972    Years since quitting: 50.0  . Smokeless tobacco: Never Used  Substance and Sexual Activity  .  Alcohol use: Never  . Drug use: Never  . Sexual activity: Not Currently  Other Topics Concern  . Not on file  Social History Narrative  . Not on file   Social Determinants of Health   Financial Resource Strain: Not on file  Food Insecurity: Not on file  Transportation Needs: Not on file  Physical Activity: Not on file  Stress: Not on file  Social Connections: Not on file     Family History: The patient's family history includes Heart attack in her sister.  ROS:   Please see the history of  present illness.    All other systems reviewed and are negative.  EKGs/Labs/Other Studies Reviewed:    The following studies were reviewed today: IMPRESSIONS    1. Left ventricular ejection fraction, by estimation, is 60 to 65%. The  left ventricle has normal function. The left ventricle has no regional  wall motion abnormalities. There is mild concentric left ventricular  hypertrophy. Left ventricular diastolic  parameters are indeterminate. The average left ventricular global  longitudinal strain is -15.0 %.  2. Right ventricular systolic function is normal. The right ventricular  size is normal. There is normal pulmonary artery systolic pressure.  3. The mitral valve is normal in structure. No evidence of mitral valve  regurgitation. No evidence of mitral stenosis.  4. The aortic valve is normal in structure. Aortic valve regurgitation is  not visualized. No aortic stenosis is present.  5. Abdominal Aorta is normal sized.  6. The inferior vena cava is normal in size with greater than 50%  respiratory variability, suggesting right atrial pressure of 3 mmHg.    Study Highlights   The left ventricular ejection fraction is normal (55-65%).  Nuclear stress EF: 61%.  There was no ST segment deviation noted during stress.  This is a low risk study.     Recent Labs: 04/17/2020: TSH 2.560 08/18/2020: ALT 28; BUN 20; Creatinine, Ser 0.89; Hemoglobin 14.2; Platelets 251; Potassium 4.4; Sodium 143  Recent Lipid Panel    Component Value Date/Time   CHOL 131 08/18/2020 0841   TRIG 71 08/18/2020 0841   HDL 41 08/18/2020 0841   CHOLHDL 3.2 08/18/2020 0841   LDLCALC 76 08/18/2020 0841    Physical Exam:    VS:  There were no vitals taken for this visit.    Wt Readings from Last 3 Encounters:  09/05/20 207 lb (93.9 kg)  08/25/20 207 lb 9.6 oz (94.2 kg)  08/18/20 205 lb (93 kg)     GEN: Patient is in no acute distress HEENT: Normal NECK: No JVD; No carotid  bruits LYMPHATICS: No lymphadenopathy CARDIAC: Hear sounds regular, 2/6 systolic murmur at the apex. RESPIRATORY:  Clear to auscultation without rales, wheezing or rhonchi  ABDOMEN: Soft, non-tender, non-distended MUSCULOSKELETAL:  No edema; No deformity  SKIN: Warm and dry NEUROLOGIC:  Alert and oriented x 3 PSYCHIATRIC:  Normal affect   Signed, Jenean Lindau, MD  10/25/2020 3:01 PM    [facility]

## 2020-11-14 ENCOUNTER — Other Ambulatory Visit: Payer: Self-pay | Admitting: Legal Medicine

## 2020-11-14 DIAGNOSIS — I10 Essential (primary) hypertension: Secondary | ICD-10-CM

## 2020-11-14 DIAGNOSIS — E782 Mixed hyperlipidemia: Secondary | ICD-10-CM

## 2020-12-15 ENCOUNTER — Ambulatory Visit (INDEPENDENT_AMBULATORY_CARE_PROVIDER_SITE_OTHER): Payer: Managed Care, Other (non HMO) | Admitting: Legal Medicine

## 2020-12-15 ENCOUNTER — Encounter: Payer: Self-pay | Admitting: Legal Medicine

## 2020-12-15 ENCOUNTER — Other Ambulatory Visit: Payer: Self-pay

## 2020-12-15 VITALS — BP 130/70 | HR 47 | Temp 97.4°F | Resp 16 | Ht 63.0 in | Wt 188.0 lb

## 2020-12-15 DIAGNOSIS — E1169 Type 2 diabetes mellitus with other specified complication: Secondary | ICD-10-CM

## 2020-12-15 DIAGNOSIS — E1159 Type 2 diabetes mellitus with other circulatory complications: Secondary | ICD-10-CM

## 2020-12-15 DIAGNOSIS — E669 Obesity, unspecified: Secondary | ICD-10-CM | POA: Diagnosis not present

## 2020-12-15 DIAGNOSIS — K2100 Gastro-esophageal reflux disease with esophagitis, without bleeding: Secondary | ICD-10-CM

## 2020-12-15 DIAGNOSIS — H8113 Benign paroxysmal vertigo, bilateral: Secondary | ICD-10-CM | POA: Diagnosis not present

## 2020-12-15 DIAGNOSIS — I152 Hypertension secondary to endocrine disorders: Secondary | ICD-10-CM

## 2020-12-15 DIAGNOSIS — D352 Benign neoplasm of pituitary gland: Secondary | ICD-10-CM

## 2020-12-15 DIAGNOSIS — M810 Age-related osteoporosis without current pathological fracture: Secondary | ICD-10-CM

## 2020-12-15 DIAGNOSIS — F322 Major depressive disorder, single episode, severe without psychotic features: Secondary | ICD-10-CM | POA: Diagnosis not present

## 2020-12-15 DIAGNOSIS — I10 Essential (primary) hypertension: Secondary | ICD-10-CM | POA: Diagnosis not present

## 2020-12-15 DIAGNOSIS — E782 Mixed hyperlipidemia: Secondary | ICD-10-CM

## 2020-12-15 LAB — POCT UA - MICROALBUMIN: Microalbumin Ur, POC: 30 mg/L

## 2020-12-15 MED ORDER — SERTRALINE HCL 50 MG PO TABS: 50.0000 mg | ORAL_TABLET | Freq: Every day | ORAL | 3 refills | Status: DC

## 2020-12-15 NOTE — Progress Notes (Signed)
Subjective:  Patient ID: Diane Chang, female    DOB: 1940/10/20  Age: 80 y.o. MRN: [id number]  Chief Complaint  Patient presents with  . Hyperlipidemia  . Hypertension  . Diabetes    HPI: chronic visit  Patient present with type 2 diabetes.  Specifically, this is type 2, noninsulin requiring diabetes, complicated by hypertension and hyperlipidemia.  Compliance with treatment has been good; patient take medicines as directed, maintains diet and exercise regimen, follows up as directed, and is keeping glucose diary.  Date of  diagnosis 2010.  Depression screen has been performed.Tobacco screen onsmoker. Current medicines for diabetes metformin.  Patient is on lisinopril for renal protection and atorvastatin for cholesterol control.  Patient performs foot exams daily and last ophthalmologic exam was no.  Patient presents for follow up of hypertension.  Patient tolerating lisinopril/HCTZ well with side effects.  Patient was diagnosed with hypertension 2010 so has been treated for hypertension for 10 years.Patient is working on maintaining diet and exercise regimen and follows up as directed. Complication include none.  Patient presents with hyperlipidemia.  Compliance with treatment has been good; patient takes medicines as directed, maintains low cholesterol diet, follows up as directed, and maintains exercise regimen.  Patient is using atorvastatin without problems.  Insomnia with family stresses with grandchilden that are stealing   Current Outpatient Medications on File Prior to Visit  Medication Sig Dispense Refill  . alendronate (FOSAMAX) 70 MG tablet TAKE ONE TABLET BY MOUTH WEEKLY 4 tablet 10  . aspirin EC 81 MG tablet Take 81 mg by mouth daily. Swallow whole.    Marland Kitchen atorvastatin (LIPITOR) 40 MG tablet TAKE 1 TABLET BY MOUTH EVERY DAY 30 tablet 6  . lisinopril-hydrochlorothiazide (ZESTORETIC) 20-12.5 MG tablet TAKE 1 TABLET BY MOUTH EVERY DAY 30 tablet 6  . metFORMIN (GLUCOPHAGE) 500  MG tablet TAKE 1 TABLET BY MOUTH EVERY DAY WITH BREAKFAST 30 tablet 6  . naproxen (NAPROSYN) 500 MG tablet TAKE 1 TABLET BY MOUTH TWICE A DAY 60 tablet 5  . omeprazole (PRILOSEC) 40 MG capsule TAKE 1 CAPSULE BY MOUTH TWICE A DAY BEFORE A MEAL 60 capsule 6   No current facility-administered medications on file prior to visit.   Past Medical History:  Diagnosis Date  . Abnormal EKG 08/25/2020  . 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 Benign hypertension 12/17/2019  . Benign paroxysmal positional vertigo 05/01/2020  . BMI 35.0-35.9,adult 04/17/2020  . Cardiac murmur 08/25/2020  . Chest discomfort 08/25/2020  . Chest pain 08/18/2020  . DM type 2 with diabetic mixed hyperlipidemia ([place])   . GERD (gastroesophageal reflux disease) 12/17/2019  . Mixed hyperlipidemia   . Obesity, diabetes, and hypertension syndrome ([place]) 12/17/2019  . 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 Osteoporosis 12/17/2019  . Pituitary adenoma ([place]) 12/17/2019   Past Surgical History:  Procedure Laterality Date  . CHOLECYSTECTOMY      Family History  Problem Relation Age of Onset  . Heart attack Sister    Social History   Socioeconomic History  . Marital status: Married    Spouse name: Not on file  . Number of children: 1  . Years of education: Not on file  . Highest education level: Not on file  Occupational History  . Occupation: packing  Tobacco Use  . Smoking status: Former Smoker    Types: Cigarettes    Quit date: 1972    Years since quitting: 50.2  . Smokeless tobacco: Never Used  Substance and Sexual Activity  .  Alcohol use: Never  . Drug use: Never  . Sexual activity: Not Currently  Other Topics Concern  . Not on file  Social History Narrative  . Not on file   Social Determinants of Health   Financial Resource Strain: Not on file  Food Insecurity: Not on file  Transportation Needs: Not on file  Physical Activity: Not on file  Stress: Not on file  Social  Connections: Not on file    Review of Systems  Constitutional: Negative for activity change and appetite change.  HENT: Negative for congestion and sinus pain.   Eyes: Negative for visual disturbance.  Respiratory: Negative for shortness of breath.   Cardiovascular: Negative for chest pain, palpitations and leg swelling.  Gastrointestinal: Negative for abdominal distention and abdominal pain.  Endocrine: Positive for polyuria.  Musculoskeletal: Negative for arthralgias and back pain.  Skin: Negative.   Neurological: Negative.   Hematological: Negative.   Psychiatric/Behavioral: Positive for dysphoric mood.     Objective:  BP 130/70   Pulse (!) 47   Temp (!) 97.4 F (36.3 C)   Resp 16   Ht 5\' 3"  (1.6 m)   Wt 188 lb (85.3 kg)   SpO2 99%   BMI 33.30 kg/m   BP/Weight 12/15/2020 10/25/2020 74/05/1447  Systolic BP 185 631 497  Diastolic BP 70 70 82  Wt. (Lbs) 188 204 207.6  BMI 33.3 32.93 36.77    Physical Exam Vitals reviewed.  Constitutional:      Appearance: Normal appearance.  HENT:     Right Ear: Tympanic membrane normal.     Left Ear: Tympanic membrane normal.     Mouth/Throat:     Mouth: Mucous membranes are moist.  Eyes:     Extraocular Movements: Extraocular movements intact.     Conjunctiva/sclera: Conjunctivae normal.     Pupils: Pupils are equal, round, and reactive to light.  Cardiovascular:     Rate and Rhythm: Normal rate and regular rhythm.     Pulses: Normal pulses.     Heart sounds: Normal heart sounds. No murmur heard. No gallop.   Pulmonary:     Effort: Pulmonary effort is normal. No respiratory distress.     Breath sounds: Normal breath sounds. No rales.  Abdominal:     General: Abdomen is flat. Bowel sounds are normal. There is no distension.     Palpations: Abdomen is soft.     Tenderness: There is no abdominal tenderness.  Musculoskeletal:        General: Normal range of motion.     Cervical back: Normal range of motion and neck supple.   Skin:    General: Skin is dry.     Capillary Refill: Capillary refill takes less than 2 seconds.  Neurological:     General: No focal deficit present.     Mental Status: She is alert and oriented to person, place, and time.     Diabetic Foot Exam - Simple   Simple Foot Form Diabetic Foot exam was performed with the following findings: Yes 12/15/2020  8:06 AM  Visual Inspection See comments: Yes Sensation Testing Intact to touch and monofilament testing bilaterally: Yes Pulse Check Posterior Tibialis and Dorsalis pulse intact bilaterally: Yes Comments Bunions, hammer toes 2nd     Depression screen [facility] 2/9 12/15/2020 12/17/2019  Decreased Interest 3 0  Down, Depressed, Hopeless 3 0  PHQ - 2 Score 6 0  Altered sleeping 3 -  Tired, decreased energy 3 -  Change in appetite 3 -  Feeling bad or failure about yourself  3 -  Trouble concentrating 3 -  Moving slowly or fidgety/restless 3 -  Suicidal thoughts 0 -  PHQ-9 Score 24 -  Difficult doing work/chores Very difficult -      Lab Results  Component Value Date   WBC 5.6 12/15/2020   HGB 14.5 12/15/2020   HCT 43.2 12/15/2020   PLT 301 12/15/2020   GLUCOSE 131 (H) 12/15/2020   CHOL 135 12/15/2020   TRIG 68 12/15/2020   HDL 46 12/15/2020   LDLCALC 75 12/15/2020   ALT 24 12/15/2020   AST 29 12/15/2020   NA 141 12/15/2020   K 4.3 12/15/2020   CL 102 12/15/2020   CREATININE 0.96 12/15/2020   BUN 14 12/15/2020   CO2 25 12/15/2020   TSH 2.560 04/17/2020   HGBA1C 6.9 (H) 12/15/2020   MICROALBUR 30 12/15/2020      Assessment & Plan:  Diagnoses and all orders for this visit: Benign hypertension -     CBC with Differential/Platelet -     Comprehensive metabolic panel An individual hypertension care plan was established and reinforced today.  The patient's status was assessed using clinical findings on exam and labs or diagnostic tests. The patient's success at meeting treatment goals on disease specific  evidence-based guidelines and found to be good controlled. SELF MANAGEMENT: The patient and I together assessed ways to personally work towards obtaining the recommended goals. RECOMMENDATIONS: avoid decongestants found in common cold remedies, decrease consumption of alcohol, perform routine monitoring of BP with home BP cuff, exercise, reduction of dietary salt, take medicines as prescribed, try not to miss doses and quit smoking.  Regular exercise and maintaining a healthy weight is needed.  Stress reduction may help. A CLINICAL SUMMARY including written plan identify barriers to care unique to individual due to social or financial issues.  We attempt to mutually creat solutions for individual and family understanding.  Mixed hyperlipidemia -     Lipid panel AN INDIVIDUAL CARE PLAN for hyperlipidemia/ cholesterol was established and reinforced today.  The patient's status was assessed using clinical findings on exam, lab and other diagnostic tests. The patient's disease status was assessed based on evidence-based guidelines and found to be well controlled. MEDICATIONS were reviewed. SELF MANAGEMENT GOALS have been discussed and patient's success at attaining the goal of low cholesterol was assessed. RECOMMENDATION given include regular exercise 3 days a week and low cholesterol/low fat diet. CLINICAL SUMMARY including written plan to identify barriers unique to the patient due to social or economic  reasons was discussed.  Gastroesophageal reflux disease with esophagitis without hemorrhage Plan of care was formulated today.  She is doing well.  A plan of care was formulated using patient exam, tests and other sources to optimize care using evidence based information.  Recommend no smoking, no eating after supper, avoid fatty foods, elevate Head of bed, avoid tight fitting clothing.  Continue on omeprazole . Obesity, diabetes, and hypertension syndrome ([place]) -     POCT UA - Microalbumin -      Hemoglobin A1c An individual care plan for diabetes was established and reinforced today.  The patient's status was assessed using clinical findings on exam, labs and diagnostic testing. Patient success at meeting goals based on disease specific evidence-based guidelines and found to be good controlled. Medications were assessed and patient's understanding of the medical issues , including barriers were assessed. Recommend adherence to a diabetic diet, a graduated exercise program, HgbA1c level is checked  quarterly, and urine microalbumin performed yearly .  Annual mono-filament sensation testing performed. Lower blood pressure and control hyperlipidemia is important. Get annual eye exams and annual flu shots and smoking cessation discussed.  Self management goals were discussed.  Age-related osteoporosis without current pathological fracture AN INDIVIDUAL CARE PLAN for osteoporosis was established and reinforced today.  The patient's status was assessed using clinical findings on exam, labs, and other diagnostic testing. Patient's success at meeting treatment goals based on disease specific evidence-bassed guidelines and found to be in fair control. RECOMMENDATIONS include maintaining present medicines and treatment.  Benign paroxysmal positional vertigo due to bilateral vestibular disorder AN INDIVIDUAL CARE PLAN vertigo was established and reinforced today.  The patient's status was assessed using clinical findings on exam, labs, and other diagnostic testing. Patient's success at meeting treatment goals based on disease specific evidence-bassed guidelines and found to be in fair control. RECOMMENDATIONS include maintaining present medicines and treatment.  Pituitary adenoma ([place]) AN INDIVIDUAL CARE PLAN for sella tumor was established and reinforced today.  The patient's status was assessed using clinical findings on exam, labs, and other diagnostic testing. Patient's success at meeting treatment  goals based on disease specific evidence-bassed guidelines and found to be in fair control. She sees [facility] for this and there has been no changes. RECOMMENDATIONS include maintaining present medicines and treatment.  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 Patient's depression is uncontrolled with no medicines.   Anhedonia worse.  PHQ 9 was performed score 24. An individual care plan was established or reinforced today.  The patient's disease status was assessed using clinical findings on exam, labs, and or other diagnostic testing to determine patient's success in meeting treatment goals based on disease specific evidence-based guidelines and found to be worsening Recommendations include start zoloft Other orders -     Cardiovascular Risk Assessment    Meds ordered this encounter  Medications  . sertraline (ZOLOFT) 50 MG tablet    Sig: Take 1 tablet (50 mg total) by mouth daily.    Dispense:  30 tablet    Refill:  3    Orders Placed This Encounter  Procedures  . CBC with Differential/Platelet  . Comprehensive metabolic panel  . Hemoglobin A1c  . Lipid panel  . Cardiovascular Risk Assessment  . POCT UA - Microalbumin      I spent 35 minutes dedicated to the care of this patient on the date of this encounter to include face-to-face time with the patient, as well as: extensive counseling  Follow-up: Return in about 2 weeks (around 12/29/2020) for depression.  An After Visit Summary was printed and given to the patient.  Reinaldo Meeker, MD [facility] [phone]

## 2020-12-16 LAB — COMPREHENSIVE METABOLIC PANEL
ALT: 24 IU/L (ref 0–32)
AST: 29 IU/L (ref 0–40)
Albumin/Globulin Ratio: 1.4 (ref 1.2–2.2)
Albumin: 4.3 g/dL (ref 3.7–4.7)
Alkaline Phosphatase: 62 IU/L (ref 44–121)
BUN/Creatinine Ratio: 15 (ref 12–28)
BUN: 14 mg/dL (ref 8–27)
Bilirubin Total: 0.5 mg/dL (ref 0.0–1.2)
CO2: 25 mmol/L (ref 20–29)
Calcium: 10.3 mg/dL (ref 8.7–10.3)
Chloride: 102 mmol/L (ref 96–106)
Creatinine, Ser: 0.96 mg/dL (ref 0.57–1.00)
Globulin, Total: 3 g/dL (ref 1.5–4.5)
Glucose: 131 mg/dL — ABNORMAL HIGH (ref 65–99)
Potassium: 4.3 mmol/L (ref 3.5–5.2)
Sodium: 141 mmol/L (ref 134–144)
Total Protein: 7.3 g/dL (ref 6.0–8.5)
eGFR: 61 mL/min/{1.73_m2} (ref >59)

## 2020-12-16 LAB — LIPID PANEL
Chol/HDL Ratio: 2.9 ratio (ref 0.0–4.4)
Cholesterol, Total: 135 mg/dL (ref 100–199)
HDL: 46 mg/dL (ref >39)
LDL Chol Calc (NIH): 75 mg/dL (ref 0–99)
Triglycerides: 68 mg/dL (ref 0–149)
VLDL Cholesterol Cal: 14 mg/dL (ref 5–40)

## 2020-12-16 LAB — CBC WITH DIFFERENTIAL/PLATELET
Basophils Absolute: 0.1 10*3/uL (ref 0.0–0.2)
Basos: 2 %
EOS (ABSOLUTE): 0.2 10*3/uL (ref 0.0–0.4)
Eos: 4 %
Hematocrit: 43.2 % (ref 34.0–46.6)
Hemoglobin: 14.5 g/dL (ref 11.1–15.9)
Immature Grans (Abs): 0 10*3/uL (ref 0.0–0.1)
Immature Granulocytes: 0 %
Lymphocytes Absolute: 2.3 10*3/uL (ref 0.7–3.1)
Lymphs: 41 %
MCH: 32.2 pg (ref 26.6–33.0)
MCHC: 33.6 g/dL (ref 31.5–35.7)
MCV: 96 fL (ref 79–97)
Monocytes Absolute: 0.3 10*3/uL (ref 0.1–0.9)
Monocytes: 6 %
Neutrophils Absolute: 2.7 10*3/uL (ref 1.4–7.0)
Neutrophils: 47 %
Platelets: 301 10*3/uL (ref 150–450)
RBC: 4.5 x10E6/uL (ref 3.77–5.28)
RDW: 11.9 % (ref 11.7–15.4)
WBC: 5.6 10*3/uL (ref 3.4–10.8)

## 2020-12-16 LAB — HEMOGLOBIN A1C
Est. average glucose Bld gHb Est-mCnc: 151 mg/dL
Hgb A1c MFr Bld: 6.9 % — ABNORMAL HIGH (ref 4.8–5.6)

## 2020-12-16 LAB — CARDIOVASCULAR RISK ASSESSMENT

## 2020-12-16 NOTE — Progress Notes (Signed)
CBC normal, glucose 131, kidney tests normal, liver tests normal, A1c 6.9, Cholesterol normal lp

## 2020-12-29 ENCOUNTER — Other Ambulatory Visit: Payer: Self-pay

## 2020-12-29 ENCOUNTER — Ambulatory Visit (INDEPENDENT_AMBULATORY_CARE_PROVIDER_SITE_OTHER): Payer: Managed Care, Other (non HMO) | Admitting: Legal Medicine

## 2020-12-29 ENCOUNTER — Encounter: Payer: Self-pay | Admitting: Legal Medicine

## 2020-12-29 VITALS — BP 124/64 | HR 41 | Temp 97.6°F | Resp 16 | Ht 63.0 in | Wt 190.2 lb

## 2020-12-29 DIAGNOSIS — F322 Major depressive disorder, single episode, severe without psychotic features: Secondary | ICD-10-CM

## 2020-12-29 NOTE — Progress Notes (Signed)
Subjective:  Patient ID: Diane Chang, female    DOB: 07/31/1941  Age: 80 y.o. MRN: [id number]  Chief Complaint  Patient presents with  . Depression    Patient states she is feeling much better. States she did not take medication that was prescribed because she ended up not needing it.    HPI: follow up depression, she was put on sertraline but did not take.  The people causing problems have left the house and no she is at peace, PHQ-9 zero   Current Outpatient Medications on File Prior to Visit  Medication Sig Dispense Refill  . alendronate (FOSAMAX) 70 MG tablet TAKE ONE TABLET BY MOUTH WEEKLY 4 tablet 10  . aspirin EC 81 MG tablet Take 81 mg by mouth daily. Swallow whole.    Marland Kitchen atorvastatin (LIPITOR) 40 MG tablet TAKE 1 TABLET BY MOUTH EVERY DAY 30 tablet 6  . lisinopril-hydrochlorothiazide (ZESTORETIC) 20-12.5 MG tablet TAKE 1 TABLET BY MOUTH EVERY DAY 30 tablet 6  . metFORMIN (GLUCOPHAGE) 500 MG tablet TAKE 1 TABLET BY MOUTH EVERY DAY WITH BREAKFAST 30 tablet 6  . naproxen (NAPROSYN) 500 MG tablet TAKE 1 TABLET BY MOUTH TWICE A DAY 60 tablet 5  . omeprazole (PRILOSEC) 40 MG capsule TAKE 1 CAPSULE BY MOUTH TWICE A DAY BEFORE A MEAL 60 capsule 6   No current facility-administered medications on file prior to visit.   Past Medical History:  Diagnosis Date  . Abnormal EKG 08/25/2020  . 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 Benign hypertension 12/17/2019  . Benign paroxysmal positional vertigo 05/01/2020  . BMI 35.0-35.9,adult 04/17/2020  . Cardiac murmur 08/25/2020  . Chest discomfort 08/25/2020  . Chest pain 08/18/2020  . DM type 2 with diabetic mixed hyperlipidemia ([place])   . GERD (gastroesophageal reflux disease) 12/17/2019  . Mixed hyperlipidemia   . Obesity, diabetes, and hypertension syndrome ([place]) 12/17/2019  . 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 Osteoporosis 12/17/2019  . Pituitary adenoma ([place]) 12/17/2019   Past Surgical History:  Procedure  Laterality Date  . CHOLECYSTECTOMY      Family History  Problem Relation Age of Onset  . Heart attack Sister    Social History   Socioeconomic History  . Marital status: Married    Spouse name: Not on file  . Number of children: 1  . Years of education: Not on file  . Highest education level: Not on file  Occupational History  . Occupation: packing  Tobacco Use  . Smoking status: Former Smoker    Types: Cigarettes    Quit date: 1972    Years since quitting: 50.2  . Smokeless tobacco: Never Used  Substance and Sexual Activity  . Alcohol use: Never  . Drug use: Never  . Sexual activity: Not Currently  Other Topics Concern  . Not on file  Social History Narrative  . Not on file   Social Determinants of Health   Financial Resource Strain: Not on file  Food Insecurity: Not on file  Transportation Needs: Not on file  Physical Activity: Not on file  Stress: Not on file  Social Connections: Not on file    Review of Systems  Constitutional: Negative for activity change and appetite change.  HENT: Negative.   Eyes: Negative for visual disturbance.  Respiratory: Negative for chest tightness and shortness of breath.   Cardiovascular: Negative for chest pain, palpitations and leg swelling.  Gastrointestinal: Negative for abdominal distention and abdominal pain.  Endocrine: Negative for polyuria.  Genitourinary: Negative for dysuria and urgency.  Musculoskeletal: Negative for arthralgias and back pain.  Skin: Negative.   Neurological: Negative.   Psychiatric/Behavioral: Negative.      Objective:  BP 124/64 (BP Location: Left Arm, Patient Position: Sitting, Cuff Size: Normal)   Pulse (!) 41   Temp 97.6 F (36.4 C) (Temporal)   Resp 16   Ht 5\' 3"  (1.6 m)   Wt 190 lb 3.2 oz (86.3 kg)   SpO2 98%   BMI 33.69 kg/m   BP/Weight 12/29/2020 12/15/2020 9/67/8938  Systolic BP 101 751 025  Diastolic BP 64 70 70  Wt. (Lbs) 190.2 188 204  BMI 33.69 33.3 32.93    Physical  Exam Vitals reviewed.  Constitutional:      Appearance: Normal appearance.  HENT:     Right Ear: Tympanic membrane normal.     Left Ear: Tympanic membrane normal.  Cardiovascular:     Rate and Rhythm: Normal rate and regular rhythm.     Pulses: Normal pulses.     Heart sounds: Normal heart sounds. No murmur heard. No gallop.   Pulmonary:     Effort: Pulmonary effort is normal. No respiratory distress.     Breath sounds: Normal breath sounds. No rales.  Musculoskeletal:        General: No tenderness. Normal range of motion.     Cervical back: Normal range of motion and neck supple.  Skin:    General: Skin is warm and dry.     Capillary Refill: Capillary refill takes less than 2 seconds.  Neurological:     General: No focal deficit present.     Mental Status: She is alert and oriented to person, place, and time. Mental status is at baseline.  Psychiatric:        Mood and Affect: Mood normal.        Behavior: Behavior normal.        Thought Content: Thought content normal.        Judgment: Judgment normal.      Depression screen [facility] 2/9 12/29/2020 12/15/2020 12/17/2019  Decreased Interest 0 3 0  Down, Depressed, Hopeless 0 3 0  PHQ - 2 Score 0 6 0  Altered sleeping 0 3 -  Tired, decreased energy 0 3 -  Change in appetite 0 3 -  Feeling bad or failure about yourself  0 3 -  Trouble concentrating 0 3 -  Moving slowly or fidgety/restless 0 3 -  Suicidal thoughts 0 0 -  PHQ-9 Score 0 24 -  Difficult doing work/chores Not difficult at all Very difficult -      Lab Results  Component Value Date   WBC 5.6 12/15/2020   HGB 14.5 12/15/2020   HCT 43.2 12/15/2020   PLT 301 12/15/2020   GLUCOSE 131 (H) 12/15/2020   CHOL 135 12/15/2020   TRIG 68 12/15/2020   HDL 46 12/15/2020   LDLCALC 75 12/15/2020   ALT 24 12/15/2020   AST 29 12/15/2020   NA 141 12/15/2020   K 4.3 12/15/2020   CL 102 12/15/2020   CREATININE 0.96 12/15/2020   BUN 14 12/15/2020   CO2 25 12/15/2020    TSH 2.560 04/17/2020   HGBA1C 6.9 (H) 12/15/2020   MICROALBUR 30 12/15/2020      Assessment & Plan:   1. Depression, major, single episode, severe ([place]) Patient's depression is controlled with no mediicnes.   Anhedonia better.  PHQ 9 was performed score zero. An individual care plan was  established or reinforced today.  The patient's disease status was assessed using clinical findings on exam, labs, and or other diagnostic testing to determine patient's success in meeting treatment goals based on disease specific evidence-based guidelines and found to be improved Recommendations include no medicines, she kicked out troublesome relatives from house         I spent 15 minutes dedicated to the care of this patient on the date of this encounter to include face-to-face time with the patient, as well as:  Follow-up: Return in about 3 months (around 03/31/2021) for fasting.  An After Visit Summary was printed and given to the patient.  Reinaldo Meeker, MD [facility] [phone]

## 2021-02-06 ENCOUNTER — Other Ambulatory Visit: Payer: Self-pay | Admitting: Legal Medicine

## 2021-02-06 DIAGNOSIS — K2100 Gastro-esophageal reflux disease with esophagitis, without bleeding: Secondary | ICD-10-CM

## 2021-03-01 ENCOUNTER — Other Ambulatory Visit: Payer: Self-pay | Admitting: Legal Medicine

## 2021-03-01 DIAGNOSIS — M810 Age-related osteoporosis without current pathological fracture: Secondary | ICD-10-CM

## 2021-04-02 ENCOUNTER — Encounter: Payer: Self-pay | Admitting: Legal Medicine

## 2021-04-02 ENCOUNTER — Ambulatory Visit (INDEPENDENT_AMBULATORY_CARE_PROVIDER_SITE_OTHER): Payer: Managed Care, Other (non HMO) | Admitting: Legal Medicine

## 2021-04-02 ENCOUNTER — Other Ambulatory Visit: Payer: Self-pay

## 2021-04-02 VITALS — BP 110/68 | HR 57 | Temp 96.7°F | Ht 65.0 in | Wt 201.0 lb

## 2021-04-02 DIAGNOSIS — E669 Obesity, unspecified: Secondary | ICD-10-CM | POA: Diagnosis not present

## 2021-04-02 DIAGNOSIS — E1159 Type 2 diabetes mellitus with other circulatory complications: Secondary | ICD-10-CM

## 2021-04-02 DIAGNOSIS — Z6833 Body mass index (BMI) 33.0-33.9, adult: Secondary | ICD-10-CM

## 2021-04-02 DIAGNOSIS — I152 Hypertension secondary to endocrine disorders: Secondary | ICD-10-CM

## 2021-04-02 DIAGNOSIS — E782 Mixed hyperlipidemia: Secondary | ICD-10-CM | POA: Diagnosis not present

## 2021-04-02 DIAGNOSIS — E1169 Type 2 diabetes mellitus with other specified complication: Secondary | ICD-10-CM

## 2021-04-02 DIAGNOSIS — I10 Essential (primary) hypertension: Secondary | ICD-10-CM

## 2021-04-02 DIAGNOSIS — K2100 Gastro-esophageal reflux disease with esophagitis, without bleeding: Secondary | ICD-10-CM

## 2021-04-02 DIAGNOSIS — M810 Age-related osteoporosis without current pathological fracture: Secondary | ICD-10-CM

## 2021-04-02 NOTE — Progress Notes (Addendum)
Established Patient Office Visit  Subjective:  Patient ID: Diane Chang, female    DOB: [birth year]  Age: 80 y.o. MRN: [id number]  CC:  Chief Complaint  Patient presents with   Hypertension   Hyperlipidemia    HPI Diane Chang presents for Chronic visit  Patient present with type 2 diabetes.  Specifically, this is type 2, noninsulin requiring diabetes, complicated by hypertension and hypercholesterolemia.  Compliance with treatment has been good; patient take medicines as directed, maintains diet and exercise regimen, follows up as directed, and is keeping glucose diary.  Date of  diagnosis 2010.  Depression screen has been performed.Tobacco screen nonsmoker. Current medicines for diabetes metformin.  Patient is on lisinpril for renal protection and atorvastatin for cholesterol control.  Patient performs foot exams daily and last ophthalmologic exam was no  Patient presents for follow up of hypertension.  Patient tolerating lisinopril/HCTZ well with side effects.  Patient was diagnosed with hypertension 2010 so has been treated for hypertension for 10 years.Patient is working on maintaining diet and exercise regimen and follows up as directed. Complication include none . Dr. Wille Glaser  Patient presents with hyperlipidemia.  Compliance with treatment has been good; patient takes medicines as directed, maintains low cholesterol diet, follows up as directed, and maintains exercise regimen.  Patient is using atorvastatin without problems. . .   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    Past Surgical History:  Procedure Laterality Date   CHOLECYSTECTOMY      Family History  Problem Relation Age of Onset   Heart attack Sister     Social History   Socioeconomic History   Marital status: Married    Spouse name: Not on file   Number of children: 1   Years of education: Not on file   Highest education level: Not on file  Occupational History   Occupation: packing  Tobacco Use   Smoking status: Former    Pack years: 0.00    Types: Cigarettes    Quit date: 1972    Years since quitting: 50.5   Smokeless tobacco: Never  Substance and Sexual Activity   Alcohol use: Never   Drug use: Never   Sexual activity: Not Currently  Other Topics Concern   Not on file  Social History Narrative   Not on file   Social Determinants of Health   Financial Resource Strain: Not on file  Food Insecurity: Not on file  Transportation Needs: Not on file  Physical Activity: Not on file  Stress: Not on file  Social Connections: Not on file  Intimate Partner Violence: Not on file    Outpatient Medications Prior to Visit  Medication Sig Dispense Refill   alendronate (FOSAMAX) 70 MG tablet TAKE ONE TABLET BY MOUTH WEEKLY 4 tablet 10   aspirin EC 81 MG tablet Take 81 mg by mouth daily. Swallow whole.     atorvastatin (LIPITOR) 40 MG tablet TAKE 1 TABLET BY  MOUTH EVERY DAY 30 tablet 6   lisinopril-hydrochlorothiazide (ZESTORETIC) 20-12.5 MG tablet TAKE 1 TABLET BY MOUTH EVERY DAY 30 tablet 6   metFORMIN (GLUCOPHAGE) 500 MG tablet TAKE 1 TABLET BY MOUTH EVERY DAY WITH BREAKFAST 30 tablet 6   naproxen (NAPROSYN) 500 MG tablet TAKE 1 TABLET BY MOUTH TWICE A DAY 60 tablet 5   omeprazole (PRILOSEC) 40 MG capsule TAKE 1 CAPSULE BY MOUTH TWICE A DAY BEFORE A MEAL 60 capsule 6   No facility-administered  medications prior to visit.    No Known Allergies  ROS Review of Systems  Constitutional:  Negative for activity change and appetite change.  HENT:  Negative for congestion.   Respiratory:  Negative for chest tightness and shortness of breath.   Cardiovascular:  Negative for chest pain, palpitations and leg swelling.  Gastrointestinal:  Negative for abdominal distention and abdominal pain.  Genitourinary: Negative.  Negative for difficulty urinating and dysuria.  Musculoskeletal:  Negative for arthralgias and back pain.  Neurological:  Negative for dizziness and headaches.  Psychiatric/Behavioral: Negative.       Objective:    Physical Exam Vitals reviewed.  Constitutional:      General: She is not in acute distress.    Appearance: Normal appearance. She is obese.  HENT:     Head: Normocephalic and atraumatic.     Right Ear: Tympanic membrane, ear canal and external ear normal.     Left Ear: Tympanic membrane, ear canal and external ear normal.     Nose: Nose normal.     Mouth/Throat:     Mouth: Mucous membranes are moist.     Pharynx: Oropharynx is clear.  Eyes:     Extraocular Movements: Extraocular movements intact.     Conjunctiva/sclera: Conjunctivae normal.     Pupils: Pupils are equal, round, and reactive to light.  Cardiovascular:     Rate and Rhythm: Normal rate and regular rhythm.     Pulses: Normal pulses.     Heart sounds: Normal heart sounds.  Pulmonary:     Effort: Pulmonary effort is normal. No respiratory distress.     Breath sounds: Normal breath sounds. No wheezing.  Abdominal:     General: Abdomen is flat. Bowel sounds are normal. There is no distension.     Palpations: Abdomen is soft.     Tenderness: There is no abdominal tenderness.  Musculoskeletal:        General: Swelling (3rd PIP joint right hand) present.     Cervical back: Normal range of motion and neck supple.  Skin:    General: Skin is warm.     Capillary Refill: Capillary refill  takes less than 2 seconds.  Neurological:     General: No focal deficit present.     Mental Status: She is alert and oriented to person, place, and time. Mental status is at baseline.  Psychiatric:        Mood and Affect: Mood normal.        Behavior: Behavior normal.        Thought Content: Thought content normal.        Judgment: Judgment normal.    BP 110/68   Pulse (!) 57   Temp (!) 96.7 F (35.9 C)   Ht $R'5\' 5"'pk$  (1.651 m)   Wt 201 lb (91.2 kg)   SpO2 99%   BMI 33.45 kg/m  Wt Readings from Last 3 Encounters:  04/02/21 201 lb (91.2 kg)  12/29/20 190 lb 3.2 oz (86.3 kg)  12/15/20 188 lb (85.3 kg)     Health Maintenance Due  Topic Date Due   OPHTHALMOLOGY EXAM  Never done   Hepatitis C Screening  Never done   TETANUS/TDAP  Never done   Zoster Vaccines- Shingrix (1 of 2) Never done   DEXA SCAN  Never done   PNA vac Low Risk Adult (1 of 2 - PCV13) Never done   COVID-19 Vaccine (3 - Booster for Pfizer series) 07/07/2020    There are no preventive care reminders to display for this patient.  Lab Results  Component Value Date   TSH 2.560 04/17/2020   Lab Results  Component Value Date   WBC 5.6 12/15/2020   HGB 14.5 12/15/2020   HCT 43.2 12/15/2020   MCV 96 12/15/2020   PLT 301 12/15/2020   Lab Results  Component Value Date   NA 141 12/15/2020   K 4.3 12/15/2020   CO2 25 12/15/2020   GLUCOSE 131 (H) 12/15/2020   BUN 14 12/15/2020   CREATININE 0.96 12/15/2020   BILITOT 0.5 12/15/2020   ALKPHOS 62 12/15/2020   AST 29 12/15/2020   ALT 24 12/15/2020   PROT 7.3 12/15/2020   ALBUMIN 4.3 12/15/2020   CALCIUM 10.3 12/15/2020   EGFR 61 12/15/2020   Lab Results  Component Value Date   CHOL 135 12/15/2020   Lab Results  Component Value Date   HDL 46 12/15/2020   Lab Results  Component Value Date   LDLCALC 75 12/15/2020   Lab Results  Component Value Date   TRIG 68 12/15/2020   Lab Results  Component Value Date   CHOLHDL 2.9 12/15/2020   Lab  Results  Component Value Date   HGBA1C 6.9 (H) 12/15/2020      Assessment & Plan:   Diagnoses and all orders for this visit: Obesity, diabetes, and hypertension syndrome ([place]) -     Hemoglobin A1c An individual care plan for diabetes was established and reinforced today.  The patient's status was assessed using clinical findings on exam, labs and diagnostic testing. Patient success at meeting goals based on disease specific evidence-based guidelines and found to be fair controlled. Medications were assessed and patient's understanding of the medical issues , including barriers were assessed. Recommend adherence to a diabetic diet, a graduated exercise program, HgbA1c level is checked quarterly, and urine microalbumin performed yearly .  Annual mono-filament sensation testing performed. Lower blood pressure and control hyperlipidemia is important. Get annual eye exams and annual flu shots and smoking cessation discussed.  Self management goals were discussed.   Benign hypertension -     CBC with Differential/Platelet -     Comprehensive metabolic panel An individual hypertension care plan was established and reinforced today.  The patient's status was assessed using clinical findings on exam and labs or diagnostic tests. The patient's success at meeting treatment goals on disease specific evidence-based guidelines and found to be well controlled. SELF MANAGEMENT: The patient and I together assessed ways to personally work towards obtaining the recommended goals. RECOMMENDATIONS: avoid decongestants found in common cold remedies, decrease consumption of alcohol, perform routine monitoring of BP with home BP cuff, exercise, reduction of dietary salt, take medicines as prescribed, try not to miss doses and quit smoking.  Regular exercise and maintaining a healthy weight is needed.  Stress reduction may help. A CLINICAL SUMMARY including written plan identify barriers to care unique to individual due  to social or financial issues.  We attempt to mutually creat solutions for  individual and family understanding.   Gastroesophageal reflux disease with esophagitis without hemorrhage Plan of care was formulated today.  She is doing well.  A plan of care was formulated using patient exam, tests and other sources to optimize care using evidence based information.  Recommend no smoking, no eating after supper, avoid fatty foods, elevate Head of bed, avoid tight fitting clothing.  Continue on omeprazole .  Age-related osteoporosis without current pathological fracture AN INDIVIDUAL CARE PLAN for osteoporosis was established and reinforced today.  The patient's status was assessed using clinical findings on exam, labs, and other diagnostic testing. Patient's success at meeting treatment goals based on disease specific evidence-bassed guidelines and found to be in good control. RECOMMENDATIONS include maintaining present medicines and treatment.   Mixed hyperlipidemia -     Lipid panel AN INDIVIDUAL CARE PLAN for hyperlipidemia/ cholesterol was established and reinforced today.  The patient's status was assessed using clinical findings on exam, lab and other diagnostic tests. The patient's disease status was assessed based on evidence-based guidelines and found to be fair controlled. MEDICATIONS were reviewed. SELF MANAGEMENT GOALS have been discussed and patient's success at attaining the goal of low cholesterol was assessed. RECOMMENDATION given include regular exercise 3 days a week and low cholesterol/low fat diet. CLINICAL SUMMARY including written plan to identify barriers unique to the patient due to social or economic  reasons was discussed.   BMI 33.0-33.9,adult  An individualize plan was formulated for obesity using patient history and physical exam to encourage weight loss.  An evidence based program was formulated.  Patient is to cut portion size with meals and to plan physical exercise 3 days  a week at least 20 minutes.  Weight watchers and other programs are helpful.  Planned amount of weight loss 10 lbs.   30 minute visit, review of old records   Follow-up: Return in about 4 months (around 08/02/2021) for fasting.    Reinaldo Meeker, MD

## 2021-04-03 ENCOUNTER — Encounter: Payer: Self-pay | Admitting: Legal Medicine

## 2021-04-03 LAB — COMPREHENSIVE METABOLIC PANEL
ALT: 23 IU/L (ref 0–32)
AST: 22 IU/L (ref 0–40)
Albumin/Globulin Ratio: 1.7 (ref 1.2–2.2)
Albumin: 4.1 g/dL (ref 3.7–4.7)
Alkaline Phosphatase: 53 IU/L (ref 44–121)
BUN/Creatinine Ratio: 21 (ref 12–28)
BUN: 21 mg/dL (ref 8–27)
Bilirubin Total: 0.3 mg/dL (ref 0.0–1.2)
CO2: 25 mmol/L (ref 20–29)
Calcium: 9.7 mg/dL (ref 8.7–10.3)
Chloride: 103 mmol/L (ref 96–106)
Creatinine, Ser: 1 mg/dL (ref 0.57–1.00)
Globulin, Total: 2.4 g/dL (ref 1.5–4.5)
Glucose: 142 mg/dL — ABNORMAL HIGH (ref 65–99)
Potassium: 4.8 mmol/L (ref 3.5–5.2)
Sodium: 141 mmol/L (ref 134–144)
Total Protein: 6.5 g/dL (ref 6.0–8.5)
eGFR: 58 mL/min/{1.73_m2} — ABNORMAL LOW (ref >59)

## 2021-04-03 LAB — CBC WITH DIFFERENTIAL/PLATELET
Basophils Absolute: 0.1 10*3/uL (ref 0.0–0.2)
Basos: 2 %
EOS (ABSOLUTE): 0.3 10*3/uL (ref 0.0–0.4)
Eos: 5 %
Hematocrit: 41.4 % (ref 34.0–46.6)
Hemoglobin: 13.7 g/dL (ref 11.1–15.9)
Immature Grans (Abs): 0 10*3/uL (ref 0.0–0.1)
Immature Granulocytes: 0 %
Lymphocytes Absolute: 2.3 10*3/uL (ref 0.7–3.1)
Lymphs: 40 %
MCH: 32.2 pg (ref 26.6–33.0)
MCHC: 33.1 g/dL (ref 31.5–35.7)
MCV: 97 fL (ref 79–97)
Monocytes Absolute: 0.4 10*3/uL (ref 0.1–0.9)
Monocytes: 6 %
Neutrophils Absolute: 2.8 10*3/uL (ref 1.4–7.0)
Neutrophils: 47 %
Platelets: 232 10*3/uL (ref 150–450)
RBC: 4.25 x10E6/uL (ref 3.77–5.28)
RDW: 11.7 % (ref 11.7–15.4)
WBC: 5.8 10*3/uL (ref 3.4–10.8)

## 2021-04-03 LAB — HEMOGLOBIN A1C
Est. average glucose Bld gHb Est-mCnc: 148 mg/dL
Hgb A1c MFr Bld: 6.8 % — ABNORMAL HIGH (ref 4.8–5.6)

## 2021-04-03 LAB — LIPID PANEL
Chol/HDL Ratio: 3.8 ratio (ref 0.0–4.4)
Cholesterol, Total: 160 mg/dL (ref 100–199)
HDL: 42 mg/dL (ref >39)
LDL Chol Calc (NIH): 91 mg/dL (ref 0–99)
Triglycerides: 157 mg/dL — ABNORMAL HIGH (ref 0–149)
VLDL Cholesterol Cal: 27 mg/dL (ref 5–40)

## 2021-04-03 LAB — CARDIOVASCULAR RISK ASSESSMENT

## 2021-04-03 NOTE — Progress Notes (Signed)
Cbc normal, glucose 142, kidneys stage 3a, liver tests normal, A1c 6.8, triglycerides high 157 lp

## 2021-04-11 ENCOUNTER — Ambulatory Visit (INDEPENDENT_AMBULATORY_CARE_PROVIDER_SITE_OTHER): Payer: Managed Care, Other (non HMO) | Admitting: Cardiology

## 2021-04-11 ENCOUNTER — Other Ambulatory Visit: Payer: Self-pay

## 2021-04-11 ENCOUNTER — Encounter: Payer: Self-pay | Admitting: Cardiology

## 2021-04-11 VITALS — BP 150/80 | HR 54 | Ht 65.0 in | Wt 205.0 lb

## 2021-04-11 DIAGNOSIS — E1159 Type 2 diabetes mellitus with other circulatory complications: Secondary | ICD-10-CM | POA: Diagnosis not present

## 2021-04-11 DIAGNOSIS — E1169 Type 2 diabetes mellitus with other specified complication: Secondary | ICD-10-CM

## 2021-04-11 DIAGNOSIS — I152 Hypertension secondary to endocrine disorders: Secondary | ICD-10-CM

## 2021-04-11 DIAGNOSIS — E669 Obesity, unspecified: Secondary | ICD-10-CM

## 2021-04-11 DIAGNOSIS — E782 Mixed hyperlipidemia: Secondary | ICD-10-CM | POA: Diagnosis not present

## 2021-04-11 NOTE — Patient Instructions (Signed)
Medication Instructions:  No medication changes. *If you need a refill on your cardiac medications before your next appointment, please call your pharmacy*   Lab Work: None ordered If you have labs (blood work) drawn today and your tests are completely normal, you will receive your results only by: [name] (if you have MyChart) OR A paper copy in the mail If you have any lab test that is abnormal or we need to change your treatment, we will call you to review the results.   Testing/Procedures: None ordered   Follow-Up: At [facility], you and your health needs are our priority.  As part of our continuing mission to provide you with exceptional heart care, we have created designated Provider Care Teams.  These Care Teams include your primary Cardiologist (physician) and Advanced Practice Providers (APPs -  Physician Assistants and Nurse Practitioners) who all work together to provide you with the care you need, when you need it.  We recommend signing up for the patient portal called "MyChart".  Sign up information is provided on this After Visit Summary.  MyChart is used to connect with patients for Virtual Visits (Telemedicine).  Patients are able to view lab/test results, encounter notes, upcoming appointments, etc.  Non-urgent messages can be sent to your provider as well.   To learn more about what you can do with MyChart, go to [email or web address].    Your next appointment:   6 month(s)  The format for your next appointment:   In Person  Provider:   Jyl Heinz, MD   Other Instructions Blood Pressure Record Sheet To take your blood pressure, you will need a blood pressure machine. You can buy a blood pressure machine (blood pressure monitor) at your clinic, drug store, or online. When choosing one, consider: An automatic monitor that has an arm cuff. A cuff that wraps snugly around your upper arm. You should be able to fit only one finger between your arm  and the cuff. A device that stores blood pressure reading results. Do not choose a monitor that measures your blood pressure from your wrist or finger. Follow your health care provider's instructions for how to take your blood pressure. To use this form: Get one reading in the morning (a.m.) 1-2 hours after you take any medicines. Get one reading in the evening (p.m.) before supper. Take at least 2 readings with each blood pressure check. This makes sure the results are correct. Wait 1-2 minutes between measurements. Write down the results in the spaces on this form. Repeat this once a week, or as told by your health care provider.  Make a follow-up appointment with your health care provider to discuss the results. Blood pressure log Date: _______________________ a.m. _____________________(1st reading) _____________________(2nd reading) p.m. _____________________(1st reading) _____________________(2nd reading) Date: _______________________ a.m. _____________________(1st reading) _____________________(2nd reading) p.m. _____________________(1st reading) _____________________(2nd reading) Date: _______________________ a.m. _____________________(1st reading) _____________________(2nd reading) p.m. _____________________(1st reading) _____________________(2nd reading) Date: _______________________ a.m. _____________________(1st reading) _____________________(2nd reading) p.m. _____________________(1st reading) _____________________(2nd reading) Date: _______________________ a.m. _____________________(1st reading) _____________________(2nd reading) p.m. _____________________(1st reading) _____________________(2nd reading) This information is not intended to replace advice given to you by your health care provider. Make sure you discuss any questions you have with your health care provider. Document Revised: 01/12/2020 Document Reviewed: 01/12/2020 Elsevier Patient Education  2021 [employer].

## 2021-04-11 NOTE — Progress Notes (Signed)
Cardiology Office Note:    Date:  04/11/2021   ID:  Diane Chang, DOB 14-Feb-1941, MRN [id number]  PCP:  Lillard Anes, MD  Cardiologist:  Jenean Lindau, MD   Referring MD: Lillard Anes,*    ASSESSMENT:    1. Mixed hyperlipidemia   2. Obesity, diabetes, and hypertension syndrome ([place])    PLAN:    In order of problems listed above:  Primary prevention stressed with the patient.  Importance of compliance with diet medication stressed and she vocalized understanding.  She was advised to walk at least half an hour a day 5 days a week and she promises to do so. Essential hypertension: Blood pressure stable and diet was emphasized.  She does not check her blood pressures at home and I told her to do so and bring her blood pressure log to [place] in the next week or 2.  We will titrate medications accordingly. Mixed dyslipidemia and obesity: Recent blood work was reviewed and it was fine.  Lipids were reviewed.  Weight reduction was stressed.  Risks of obesity explained and she promises to comply. Patient will be seen in follow-up appointment in 6 months or earlier if the patient has any concerns    Medication Adjustments/Labs and Tests Ordered: Current medicines are reviewed at length with the patient today.  Concerns regarding medicines are outlined above.  No orders of the defined types were placed in this encounter.  No orders of the defined types were placed in this encounter.    Chief Complaint  Patient presents with   Follow-up     History of Present Illness:    Diane Chang is a 80 y.o. female.  Patient has past medical history of essential hypertension and dyslipidemia.  She has gained significant weight in the past several months.  No chest pain orthopnea or PND.  At the time of my evaluation, the patient is alert awake oriented and in no distress.  She does not exercise on a regular basis and leads a sedentary lifestyle.  Past Medical History:   Diagnosis Date   Abnormal EKG 08/25/2020   Age-related osteoporosis without current pathological fracture    Benign hypertension 12/17/2019   Benign paroxysmal positional vertigo 05/01/2020   BMI 35.0-35.9,adult 04/17/2020   Cardiac murmur 08/25/2020   Chest discomfort 08/25/2020   Chest pain 08/18/2020   DM type 2 with diabetic mixed hyperlipidemia (HCC)    GERD (gastroesophageal reflux disease) 12/17/2019   Mixed hyperlipidemia    Obesity, diabetes, and hypertension syndrome ([place]) 12/17/2019   Osteoarthritis of hip 12/17/2019   Osteoporosis 12/17/2019   Pituitary adenoma ([place]) 12/17/2019    Past Surgical History:  Procedure Laterality Date   CHOLECYSTECTOMY      Current Medications: Current Meds  Medication Sig   alendronate (FOSAMAX) 70 MG tablet TAKE ONE TABLET BY MOUTH WEEKLY   aspirin EC 81 MG tablet Take 81 mg by mouth daily. Swallow whole.   atorvastatin (LIPITOR) 40 MG tablet TAKE 1 TABLET BY MOUTH EVERY DAY   lisinopril-hydrochlorothiazide (ZESTORETIC) 20-12.5 MG tablet TAKE 1 TABLET BY MOUTH EVERY DAY   metFORMIN (GLUCOPHAGE) 500 MG tablet TAKE 1 TABLET BY MOUTH EVERY DAY WITH BREAKFAST   naproxen (NAPROSYN) 500 MG tablet TAKE 1 TABLET BY MOUTH TWICE A DAY   omeprazole (PRILOSEC) 40 MG capsule TAKE 1 CAPSULE BY MOUTH TWICE A DAY BEFORE A MEAL     Allergies:   Patient has no known allergies.   Social History  Socioeconomic History   Marital status: Married    Spouse name: Not on file   Number of children: 1   Years of education: Not on file   Highest education level: Not on file  Occupational History   Occupation: packing  Tobacco Use   Smoking status: Former    Pack years: 0.00    Types: Cigarettes    Quit date: 1972    Years since quitting: 50.5   Smokeless tobacco: Never  Substance and Sexual Activity   Alcohol use: Never   Drug use: Never   Sexual activity: Not Currently  Other Topics Concern   Not on file  Social History Narrative   Not on file    Social Determinants of Health   Financial Resource Strain: Not on file  Food Insecurity: Not on file  Transportation Needs: Not on file  Physical Activity: Not on file  Stress: Not on file  Social Connections: Not on file     Family History: The patient's family history includes Heart attack in her sister.  ROS:   Please see the history of present illness.    All other systems reviewed and are negative.  EKGs/Labs/Other Studies Reviewed:    The following studies were reviewed today: I discussed my findings with the patient at length.   Recent Labs: 04/17/2020: TSH 2.560 04/02/2021: ALT 23; BUN 21; Creatinine, Ser 1.00; Hemoglobin 13.7; Platelets 232; Potassium 4.8; Sodium 141  Recent Lipid Panel    Component Value Date/Time   CHOL 160 04/02/2021 0815   TRIG 157 (H) 04/02/2021 0815   HDL 42 04/02/2021 0815   CHOLHDL 3.8 04/02/2021 0815   LDLCALC 91 04/02/2021 0815    Physical Exam:    VS:  BP (!) 150/80 (BP Location: Left Arm, Patient Position: Sitting, Cuff Size: Normal)   Pulse (!) 54   Ht 5\' 5"  (1.651 m)   Wt 205 lb (93 kg)   SpO2 98%   BMI 34.11 kg/m     Wt Readings from Last 3 Encounters:  04/11/21 205 lb (93 kg)  04/02/21 201 lb (91.2 kg)  12/29/20 190 lb 3.2 oz (86.3 kg)     GEN: Patient is in no acute distress HEENT: Normal NECK: No JVD; No carotid bruits LYMPHATICS: No lymphadenopathy CARDIAC: Hear sounds regular, 2/6 systolic murmur at the apex. RESPIRATORY:  Clear to auscultation without rales, wheezing or rhonchi  ABDOMEN: Soft, non-tender, non-distended MUSCULOSKELETAL:  No edema; No deformity  SKIN: Warm and dry NEUROLOGIC:  Alert and oriented x 3 PSYCHIATRIC:  Normal affect   Signed, Jenean Lindau, MD  04/11/2021 1:09 PM    [facility]

## 2021-04-12 ENCOUNTER — Other Ambulatory Visit: Payer: Self-pay | Admitting: Legal Medicine

## 2021-04-12 DIAGNOSIS — M160 Bilateral primary osteoarthritis of hip: Secondary | ICD-10-CM

## 2021-04-12 LAB — HM DIABETES EYE EXAM

## 2021-04-18 ENCOUNTER — Encounter: Payer: Self-pay | Admitting: Legal Medicine

## 2021-05-03 DIAGNOSIS — T675XXA Heat exhaustion, unspecified, initial encounter: Secondary | ICD-10-CM | POA: Diagnosis not present

## 2021-05-03 DIAGNOSIS — R001 Bradycardia, unspecified: Secondary | ICD-10-CM | POA: Diagnosis not present

## 2021-05-09 ENCOUNTER — Ambulatory Visit (INDEPENDENT_AMBULATORY_CARE_PROVIDER_SITE_OTHER): Payer: Managed Care, Other (non HMO) | Admitting: Legal Medicine

## 2021-05-09 ENCOUNTER — Other Ambulatory Visit: Payer: Self-pay

## 2021-05-09 ENCOUNTER — Encounter: Payer: Self-pay | Admitting: Legal Medicine

## 2021-05-09 VITALS — BP 108/80 | HR 72 | Temp 98.0°F | Ht 65.0 in | Wt 201.2 lb

## 2021-05-09 DIAGNOSIS — Z6833 Body mass index (BMI) 33.0-33.9, adult: Secondary | ICD-10-CM

## 2021-05-09 DIAGNOSIS — T675XXA Heat exhaustion, unspecified, initial encounter: Secondary | ICD-10-CM | POA: Insufficient documentation

## 2021-05-09 DIAGNOSIS — Z1231 Encounter for screening mammogram for malignant neoplasm of breast: Secondary | ICD-10-CM | POA: Diagnosis not present

## 2021-05-09 DIAGNOSIS — T675XXD Heat exhaustion, unspecified, subsequent encounter: Secondary | ICD-10-CM | POA: Diagnosis not present

## 2021-05-09 NOTE — Progress Notes (Signed)
Established Patient Office Visit  Subjective:  Patient ID: Diane Chang, female    DOB: 16-Mar-1941  Age: 80 y.o. MRN: [id number]  CC:  Chief Complaint  Patient presents with   Nausea   Diarrhea   Heat Exposure    HPI BRITTLYN CLOE presents for folow up from ER visit last Thursday when sh had heat exhaustion.  She was treated with fluids and is doing well now.  She wants to lose weight she has not changed diet or exercise.  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   Osteoporosis 12/17/2019   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    Past Surgical History:  Procedure Laterality Date   CHOLECYSTECTOMY      Family History  Problem Relation Age of Onset   Heart attack Sister     Social History   Socioeconomic History   Marital status: Married    Spouse name: Not on file   Number of children: 1   Years of education: Not on file   Highest education level: Not on file  Occupational History   Occupation: packing  Tobacco Use   Smoking status: Former    Types: Cigarettes    Quit date: 1972    Years since quitting: 50.6   Smokeless tobacco: Never  Substance and Sexual Activity   Alcohol use: Never   Drug use: Never   Sexual activity: Not Currently  Other Topics Concern   Not on file  Social History Narrative   Not on file   Social Determinants of Health   Financial Resource Strain: Not on  file  Food Insecurity: Not on file  Transportation Needs: Not on file  Physical Activity: Not on file  Stress: Not on file  Social Connections: Not on file  Intimate Partner Violence: Not on file    Outpatient Medications Prior to Visit  Medication Sig Dispense Refill   alendronate (FOSAMAX) 70 MG tablet TAKE ONE TABLET BY MOUTH WEEKLY 4 tablet 10   aspirin EC 81 MG tablet Take 81 mg by mouth daily. Swallow whole.     atorvastatin (LIPITOR) 40 MG tablet TAKE 1 TABLET BY MOUTH EVERY DAY 30 tablet 6   lisinopril-hydrochlorothiazide (ZESTORETIC) 20-12.5 MG tablet TAKE 1 TABLET BY MOUTH EVERY DAY 30 tablet 6   metFORMIN (GLUCOPHAGE) 500 MG tablet TAKE 1 TABLET BY MOUTH EVERY DAY WITH BREAKFAST 90 tablet 0   naproxen (NAPROSYN) 500 MG tablet TAKE 1 TABLET BY MOUTH TWICE A DAY 180 tablet 0   omeprazole (PRILOSEC) 40 MG capsule TAKE 1 CAPSULE BY MOUTH TWICE A DAY BEFORE A MEAL 60 capsule 6   No facility-administered medications prior to visit.    No Known Allergies  ROS Review of Systems  Constitutional:  Negative for activity change and appetite change.  HENT:  Negative for congestion.   Eyes:  Negative for visual disturbance.  Respiratory:  Negative for chest tightness and shortness of breath.   Cardiovascular:  Negative for chest pain and palpitations.  Gastrointestinal:  Negative for abdominal distention and abdominal pain.  Endocrine: Negative for polyuria.  Genitourinary:  Negative for difficulty urinating and dysuria.  Neurological: Negative.   Hematological: Negative.   Psychiatric/Behavioral: Negative.       Objective:    Physical Exam Vitals reviewed.  Constitutional:      Appearance: Normal appearance. She is obese.  HENT:     Head: Normocephalic.     Right Ear: Tympanic membrane, ear canal and external ear normal.     Left Ear: Tympanic membrane, ear canal and external ear normal.     Mouth/Throat:     Mouth: Mucous membranes are moist.     Pharynx: Oropharynx  is clear.  Eyes:     Extraocular Movements: Extraocular movements intact.     Conjunctiva/sclera: Conjunctivae normal.     Pupils: Pupils are equal, round, and reactive to light.  Cardiovascular:     Rate and Rhythm: Normal rate and regular rhythm.     Pulses: Normal pulses.     Heart sounds: Normal heart sounds. No murmur heard.   No gallop.  Pulmonary:     Effort: Pulmonary effort is normal. No respiratory distress.     Breath sounds: Normal breath sounds. No wheezing.  Abdominal:     General: Abdomen is flat. Bowel sounds are normal. There is no distension.     Palpations: Abdomen is soft.     Tenderness: There is no abdominal tenderness.  Musculoskeletal:        General: Normal range of motion.     Cervical back: Normal range of motion.  Skin:    General: Skin is warm.     Capillary Refill: Capillary refill takes less than 2 seconds.  Neurological:     General: No focal deficit present.     Mental Status: She is alert and oriented to person, place, and time.    BP 108/80   Pulse 72   Temp 98 F (36.7 C)   Ht $R'5\' 5"'rg$  (1.651 m)   Wt 201 lb 3.2 oz (91.3 kg)   SpO2 97%   BMI 33.48 kg/m  Wt Readings from Last 3 Encounters:  05/09/21 201 lb 3.2 oz (91.3 kg)  04/11/21 205 lb (93 kg)  04/02/21 201 lb (91.2 kg)     Health Maintenance Due  Topic Date Due   Hepatitis C Screening  Never done   TETANUS/TDAP  Never done   Zoster Vaccines- Shingrix (1 of 2) Never done   PNA vac Low Risk Adult (1 of 2 - PCV13) Never done   COVID-19 Vaccine (3 - Booster for Pfizer series) 07/07/2020   MAMMOGRAM  04/15/2021   INFLUENZA VACCINE  05/07/2021    There are no preventive care reminders to display for this patient.  Lab Results  Component Value Date   TSH 2.560 04/17/2020   Lab Results  Component Value Date   WBC 5.8 04/02/2021   HGB 13.7 04/02/2021   HCT 41.4 04/02/2021   MCV 97 04/02/2021   PLT 232 04/02/2021   Lab Results  Component Value Date   NA 141 04/02/2021    K 4.8 04/02/2021   CO2 25 04/02/2021   GLUCOSE 142 (H) 04/02/2021   BUN 21 04/02/2021   CREATININE 1.00 04/02/2021   BILITOT 0.3 04/02/2021   ALKPHOS 53 04/02/2021   AST 22 04/02/2021   ALT 23 04/02/2021   PROT 6.5 04/02/2021   ALBUMIN 4.1 04/02/2021   CALCIUM  9.7 04/02/2021   EGFR 58 (L) 04/02/2021   Lab Results  Component Value Date   CHOL 160 04/02/2021   Lab Results  Component Value Date   HDL 42 04/02/2021   Lab Results  Component Value Date   LDLCALC 91 04/02/2021   Lab Results  Component Value Date   TRIG 157 (H) 04/02/2021   Lab Results  Component Value Date   CHOLHDL 3.8 04/02/2021   Lab Results  Component Value Date   HGBA1C 6.8 (H) 04/02/2021      Assessment & Plan:   Diagnoses and all orders for this visit: BMI 33.0-33.9,adult Try weight loss with starting walking and cutting portion size, we will start ozempic for DM and weight loss when samples come in  Heat exhaustion, subsequent encounter Patient was in ER for heat exhaustion, she has been drinking fluids well and fees normal  Encounter for screening mammogram for malignant neoplasm of breast -     MM Digital Screening  Set up mammogram    Follow-up: Return next scheduled visit.    Reinaldo Meeker, MD

## 2021-05-17 ENCOUNTER — Other Ambulatory Visit: Payer: Self-pay

## 2021-05-17 MED ORDER — OZEMPIC (0.25 OR 0.5 MG/DOSE) 2 MG/1.5ML ~~LOC~~ SOPN: 0.2500 mg | PEN_INJECTOR | SUBCUTANEOUS | 0 refills | Status: DC

## 2021-06-15 ENCOUNTER — Other Ambulatory Visit: Payer: Self-pay

## 2021-06-15 ENCOUNTER — Encounter: Payer: Self-pay | Admitting: Legal Medicine

## 2021-06-15 ENCOUNTER — Ambulatory Visit (INDEPENDENT_AMBULATORY_CARE_PROVIDER_SITE_OTHER): Payer: Managed Care, Other (non HMO) | Admitting: Legal Medicine

## 2021-06-15 VITALS — BP 124/80 | HR 71 | Temp 97.5°F | Ht 65.0 in | Wt 195.2 lb

## 2021-06-15 DIAGNOSIS — R634 Abnormal weight loss: Secondary | ICD-10-CM | POA: Diagnosis not present

## 2021-06-15 DIAGNOSIS — Z6833 Body mass index (BMI) 33.0-33.9, adult: Secondary | ICD-10-CM | POA: Diagnosis not present

## 2021-06-15 DIAGNOSIS — T50905A Adverse effect of unspecified drugs, medicaments and biological substances, initial encounter: Secondary | ICD-10-CM | POA: Diagnosis not present

## 2021-06-15 MED ORDER — OZEMPIC (0.25 OR 0.5 MG/DOSE) 2 MG/1.5ML ~~LOC~~ SOPN: 0.5000 mg | PEN_INJECTOR | SUBCUTANEOUS | 6 refills | Status: DC

## 2021-06-15 NOTE — Progress Notes (Signed)
Established Patient Office Visit  Subjective:  Patient ID: Diane Chang, female    DOB: 1940-10-29  Age: 80 y.o. MRN: [id number]  CC:  Chief Complaint  Patient presents with   Follow-up    1 month follow up   Weight Loss    HPI Diane Chang presents for weight loss   Subjective:    Diane Chang is a 80 y.o. female here for discussion regarding unexplained/excessive weight loss. Onset was 1 month ago. Patient has lost approximately 6 pounds in last 1 month. She feels ideal weight is 25 pounds. History of eating disorders: none. Patient is exercising 1 hours per day. Factors associated with weight loss: . Patient denies:  none .  The following portions of the patient's history were reviewed and updated as appropriate: allergies, current medications, past family history, past medical history, past social history, past surgical history, and problem list   Past Surgical History:  Procedure Laterality Date   CHOLECYSTECTOMY      Family History  Problem Relation Age of Onset   Heart attack Sister     Social History   Socioeconomic History   Marital status: Married    Spouse name: Not on file   Number of children: 1   Years of education: Not on file   Highest education level: Not on file  Occupational History   Occupation: packing  Tobacco Use   Smoking status: Former    Types: Cigarettes    Quit date: 1972    Years since quitting: 50.7   Smokeless tobacco: Never  Substance and Sexual Activity   Alcohol use: Never   Drug use: Never   Sexual activity: Not Currently  Other Topics Concern   Not on file  Social History Narrative   Not on file   Social Determinants of Health   Financial Resource Strain: Not on file  Food Insecurity: Not on file  Transportation Needs: Not on file  Physical Activity: Not on file  Stress: Not on file  Social Connections: Not on file  Intimate Partner Violence: Not on file    Outpatient Medications Prior to Visit  Medication  Sig Dispense Refill   alendronate (FOSAMAX) 70 MG tablet TAKE ONE TABLET BY MOUTH WEEKLY 4 tablet 10   aspirin EC 81 MG tablet Take 81 mg by mouth daily. Swallow whole.     metFORMIN (GLUCOPHAGE) 500 MG tablet TAKE 1 TABLET BY MOUTH EVERY DAY WITH BREAKFAST 90 tablet 0   naproxen (NAPROSYN) 500 MG tablet TAKE 1 TABLET BY MOUTH TWICE A DAY 180 tablet 0   omeprazole (PRILOSEC) 40 MG capsule TAKE 1 CAPSULE BY MOUTH TWICE A DAY BEFORE A MEAL 60 capsule 6   atorvastatin (LIPITOR) 40 MG tablet TAKE 1 TABLET BY MOUTH EVERY DAY 30 tablet 6   lisinopril-hydrochlorothiazide (ZESTORETIC) 20-12.5 MG tablet TAKE 1 TABLET BY MOUTH EVERY DAY 30 tablet 6   Semaglutide,0.25 or 0.5MG/DOS, (OZEMPIC, 0.25 OR 0.5 MG/DOSE,) 2 MG/1.5ML SOPN Inject 0.25 mg into the skin once a week. 1.5 mL 0   No facility-administered medications prior to visit.    No Known Allergies  ROS Review of Systems  Constitutional:  Negative for activity change and appetite change.  HENT:  Negative for congestion.   Respiratory:  Negative for chest tightness and shortness of breath.   Cardiovascular:  Negative for chest pain, palpitations and leg swelling.  Gastrointestinal:  Negative for abdominal distention and abdominal pain.  Genitourinary:  Negative for difficulty urinating.  Musculoskeletal:  Negative for arthralgias and back pain.  Skin:  Negative for color change.  Psychiatric/Behavioral: Negative.       Objective:    Physical Exam Vitals reviewed.  Constitutional:      General: She is not in acute distress.    Appearance: Normal appearance.  HENT:     Head: Normocephalic and atraumatic.     Right Ear: Tympanic membrane normal.     Left Ear: Tympanic membrane normal.     Nose: Nose normal.     Mouth/Throat:     Mouth: Mucous membranes are moist.     Pharynx: Oropharynx is clear.  Eyes:     Extraocular Movements: Extraocular movements intact.     Conjunctiva/sclera: Conjunctivae normal.     Pupils: Pupils are  equal, round, and reactive to light.  Cardiovascular:     Rate and Rhythm: Normal rate and regular rhythm.     Pulses: Normal pulses.  Pulmonary:     Effort: Pulmonary effort is normal.  Abdominal:     General: Abdomen is flat. Bowel sounds are normal. There is no distension.     Palpations: Abdomen is soft.     Tenderness: There is no abdominal tenderness.  Musculoskeletal:        General: Normal range of motion.  Skin:    General: Skin is warm.     Capillary Refill: Capillary refill takes less than 2 seconds.  Neurological:     General: No focal deficit present.     Mental Status: She is alert and oriented to person, place, and time. Mental status is at baseline.    BP 124/80   Pulse 71   Temp (!) 97.5 F (36.4 C)   Ht _0  (1.651 m)   Wt 195 lb 3.2 oz (88.5 kg)   SpO2 95%   BMI 32.48 kg/m  Wt Readings from Last 3 Encounters:  06/15/21 195 lb 3.2 oz (88.5 kg)  05/09/21 201 lb 3.2 oz (91.3 kg)  04/11/21 205 lb (93 kg)     Health Maintenance Due  Topic Date Due   Hepatitis C Screening  Never done   TETANUS/TDAP  Never done   Zoster Vaccines- Shingrix (1 of 2) Never done   PNA vac Low Risk Adult (1 of 2 - PCV13) Never done   COVID-19 Vaccine (3 - Booster for Pfizer series) 07/07/2020   MAMMOGRAM  04/15/2021   INFLUENZA VACCINE  05/07/2021    There are no preventive care reminders to display for this patient.  Lab Results  Component Value Date   TSH 2.560 04/17/2020   Lab Results  Component Value Date   WBC 5.8 04/02/2021   HGB 13.7 04/02/2021   HCT 41.4 04/02/2021   MCV 97 04/02/2021   PLT 232 04/02/2021   Lab Results  Component Value Date   NA 141 04/02/2021   K 4.8 04/02/2021   CO2 25 04/02/2021   GLUCOSE 142 (H) 04/02/2021   BUN 21 04/02/2021   CREATININE 1.00 04/02/2021   BILITOT 0.3 04/02/2021   ALKPHOS 53 04/02/2021   AST 22 04/02/2021   ALT 23 04/02/2021   PROT 6.5 04/02/2021   ALBUMIN 4.1 04/02/2021   CALCIUM 9.7 04/02/2021   EGFR 58  (L) 04/02/2021   Lab Results  Component Value Date   CHOL 160 04/02/2021   Lab Results  Component Value Date   HDL 42 04/02/2021   Lab Results  Component Value Date   LDLCALC 91 04/02/2021  Lab Results  Component Value Date   TRIG 157 (H) 04/02/2021   Lab Results  Component Value Date   CHOLHDL 3.8 04/02/2021   Lab Results  Component Value Date   HGBA1C 6.8 (H) 04/02/2021      Assessment & Plan:   Problem List Items Addressed This Visit       Other   BMI 32.0-32.9,adult - Primary An individualize plan was formulated for obesity using patient history and physical exam to encourage weight loss.  An evidence based program was formulated.  Patient is to cut portion size with meals and to plan physical exercise 3 days a week at least 20 minutes.  Weight watchers and other programs are helpful.  Planned amount of weight loss 10 lbs.    Other Visit Diagnoses     Weight loss due to medication       Relevant Medications   Semaglutide,0.25 or 0.5MG/DOS, (OZEMPIC, 0.25 OR 0.5 MG/DOSE,) 2 MG/1.5ML SOPN Continue on semaglutide       Meds ordered this encounter  Medications   Semaglutide,0.25 or 0.5MG/DOS, (OZEMPIC, 0.25 OR 0.5 MG/DOSE,) 2 MG/1.5ML SOPN    Sig: Inject 0.5 mg into the skin once a week.    Dispense:  2 mL    Refill:  6    Follow-up: Return in about 1 month (around 07/15/2021) for weight check.    Reinaldo Meeker, MD

## 2021-06-16 ENCOUNTER — Other Ambulatory Visit: Payer: Self-pay | Admitting: Legal Medicine

## 2021-06-16 DIAGNOSIS — E782 Mixed hyperlipidemia: Secondary | ICD-10-CM

## 2021-06-16 DIAGNOSIS — I10 Essential (primary) hypertension: Secondary | ICD-10-CM

## 2021-07-04 ENCOUNTER — Other Ambulatory Visit: Payer: Self-pay | Admitting: Family Medicine

## 2021-07-04 DIAGNOSIS — M160 Bilateral primary osteoarthritis of hip: Secondary | ICD-10-CM

## 2021-07-17 DIAGNOSIS — Z79899 Other long term (current) drug therapy: Secondary | ICD-10-CM | POA: Diagnosis not present

## 2021-07-17 DIAGNOSIS — Z7984 Long term (current) use of oral hypoglycemic drugs: Secondary | ICD-10-CM | POA: Diagnosis not present

## 2021-07-17 DIAGNOSIS — H8110 Benign paroxysmal vertigo, unspecified ear: Secondary | ICD-10-CM | POA: Diagnosis not present

## 2021-07-17 DIAGNOSIS — E785 Hyperlipidemia, unspecified: Secondary | ICD-10-CM | POA: Diagnosis not present

## 2021-07-17 DIAGNOSIS — R42 Dizziness and giddiness: Secondary | ICD-10-CM | POA: Diagnosis not present

## 2021-07-17 DIAGNOSIS — I10 Essential (primary) hypertension: Secondary | ICD-10-CM | POA: Diagnosis not present

## 2021-07-17 DIAGNOSIS — E119 Type 2 diabetes mellitus without complications: Secondary | ICD-10-CM | POA: Diagnosis not present

## 2021-07-18 ENCOUNTER — Telehealth: Payer: Self-pay

## 2021-07-19 NOTE — Telephone Encounter (Signed)
  Transition Care Management Follow-up Telephone Call    Diane Chang Feb 16, 1941  Admit Date: 07/17/2021. Discharge Date: 07/18/2021 Discharged from where: [facility]  Diagnoses: E11.9 Type 2 DM without complications. I10 Essential primary hypertension. E78.5 Hyperlipidemia, unspecified. H81.10 Benign Paroxysmal vertigo, unspecified.  2 day post discharge: 07/20/2021 7 day post discharge: 07/25/2021 14 day post discharge: 08/01/2021  JOHNNI WUNSCHEL was discharged from [facility] on 07/18/2021 with the diagnoses listed above.  07/19/2021 She was contacted today via telephone in regards to transition of care.     Discharge Instructions: Patient was discharged with Meclizine 25 mg PO TID PRN #20 for dizziness.  Items Reviewed: Did the pt receive and understand the discharge instructions provided? Yes  Medications obtained and verified? Yes  Other? No  Any new allergies since your discharge? No  Dietary orders reviewed? No Do you have support at home? Yes   Home Care and Equipment/Supplies: Were home health services ordered? no If so, what is the name of the agency?   Has the agency set up a time to come to the patient's home? yes Were any new equipment or medical supplies ordered?  No What is the name of the medical supply agency?  Were you able to get the supplies/equipment? not applicable Do you have any questions related to the use of the equipment or supplies? No  Functional Questionnaire: (I = Independent and D = Dependent) ADLs: I  Bathing/Dressing- I  Meal Prep- I  Eating- I  Maintaining continence- I  Transferring/Ambulation- I  Managing Meds- I  Any patient concerns? no  Follow up appointments reviewed: PCP Hospital f/u appt confirmed? Yes  Scheduled to see Jerrell Belfast, NP on 07/23/2021 @ 2:00 pm. [facility] f/u appt confirmed? No   Are transportation arrangements needed? No  If their condition worsens, is the pt aware to call  PCP or go to the Emergency Dept.? Yes Was the patient provided with contact information for the PCP's office/after hours number? Yes Was to pt encouraged to call back with questions or concerns? Yes    07/19/21 4:23 PM

## 2021-07-23 ENCOUNTER — Other Ambulatory Visit: Payer: Self-pay | Admitting: Nurse Practitioner

## 2021-07-23 ENCOUNTER — Ambulatory Visit (INDEPENDENT_AMBULATORY_CARE_PROVIDER_SITE_OTHER): Payer: Managed Care, Other (non HMO) | Admitting: Nurse Practitioner

## 2021-07-23 ENCOUNTER — Other Ambulatory Visit: Payer: Self-pay

## 2021-07-23 ENCOUNTER — Encounter: Payer: Self-pay | Admitting: Nurse Practitioner

## 2021-07-23 VITALS — BP 132/62 | HR 74 | Temp 95.5°F | Ht 66.0 in | Wt 194.0 lb

## 2021-07-23 DIAGNOSIS — H8113 Benign paroxysmal vertigo, bilateral: Secondary | ICD-10-CM | POA: Diagnosis not present

## 2021-07-23 DIAGNOSIS — H9193 Unspecified hearing loss, bilateral: Secondary | ICD-10-CM

## 2021-07-23 MED ORDER — MECLIZINE HCL 25 MG PO TABS: 25.0000 mg | ORAL_TABLET | Freq: Three times a day (TID) | ORAL | 2 refills | Status: DC | PRN

## 2021-07-23 NOTE — Patient Instructions (Addendum)
Continue Meclizine as prescribed Perform Epley maneuvers daily Keep appointment with physical therapy as scheduled Avoid putting alcohol or anything else in ears May return to work on Tuesday, Oct 25th, 2022 for four hours daily Return for follow-up in 2-weeks  Benign Positional Vertigo Vertigo is the feeling that you or your surroundings are moving when they are not. Benign positional vertigo is the most common form of vertigo. This is usually a harmless condition (benign). This condition is positional. This means that symptoms are triggered by certain movements and positions. This condition can be dangerous if it occurs while you are doing something that could cause harm to yourself or others. This includes activities such as driving or operating machinery. What are the causes? The inner ear has fluid-filled canals that help your brain sense movement and balance. When the fluid moves, the brain receives messages about your body's position. With benign positional vertigo, calcium crystals in the inner ear break free and disturb the inner ear area. This causes your brain to receive confusing messages about your body's position. What increases the risk? You are more likely to develop this condition if: You are a woman. You are 92 years of age or older. You have recently had a head injury. You have an inner ear disease. What are the signs or symptoms? Symptoms of this condition usually happen when you move your head or your eyes in different directions. Symptoms may start suddenly and usually last for less than a minute. They include: Loss of balance and falling. Feeling like you are spinning or moving. Feeling like your surroundings are spinning or moving. Nausea and vomiting. Blurred vision. Dizziness. Involuntary eye movement (nystagmus). Symptoms can be mild and cause only minor problems, or they can be severe and interfere with daily life. Episodes of benign positional vertigo may  return (recur) over time. Symptoms may also improve over time. How is this diagnosed? This condition may be diagnosed based on: Your medical history. A physical exam of the head, neck, and ears. Positional tests to check for or stimulate vertigo. You may be asked to turn your head and change positions, such as going from sitting to lying down. A health care provider will watch for symptoms of vertigo. You may be referred to a health care provider who specializes in ear, nose, and throat problems (ENT or otolaryngologist) or a provider who specializes in disorders of the nervous system (neurologist). How is this treated? This condition may be treated in a session in which your health care provider moves your head in specific positions to help the displaced crystals in your inner ear move. Treatment for this condition may take several sessions. Surgery may be needed in severe cases, but this is rare. In some cases, benign positional vertigo may resolve on its own in 2-4 weeks. Follow these instructions at home: Safety Move slowly. Avoid sudden body or head movements or certain positions, as told by your health care provider. Avoid driving or operating machinery until your health care provider says it is safe. Avoid doing any tasks that would be dangerous to you or others if vertigo occurs. If you have trouble walking or keeping your balance, try using a cane for stability. If you feel dizzy or unstable, sit down right away. Return to your normal activities as told by your health care provider. Ask your health care provider what activities are safe for you. General instructions Take over-the-counter and prescription medicines only as told by your health care provider. Drink enough  fluid to keep your urine pale yellow. Keep all follow-up visits. This is important. Contact a health care provider if: You have a fever. Your condition gets worse or you develop new symptoms. Your family or friends  notice any behavioral changes. You have nausea or vomiting that gets worse. You have numbness or a prickling and tingling sensation. Get help right away if you: Have difficulty speaking or moving. Are always dizzy or faint. Develop severe headaches. Have weakness in your legs or arms. Have changes in your hearing or vision. Develop a stiff neck. Develop sensitivity to light. These symptoms may represent a serious problem that is an emergency. Do not wait to see if the symptoms will go away. Get medical help right away. Call your local emergency services (911 in the [place] Do not drive yourself to the hospital. Summary Vertigo is the feeling that you or your surroundings are moving when they are not. Benign positional vertigo is the most common form of vertigo. This condition is caused by calcium crystals in the inner ear that become displaced. This causes a disturbance in an area of the inner ear that helps your brain sense movement and balance. Symptoms include loss of balance and falling, feeling that you or your surroundings are moving, nausea and vomiting, and blurred vision. This condition can be diagnosed based on symptoms, a physical exam, and positional tests. Follow safety instructions as told by your health care provider and keep all follow-up visits. This is important. This information is not intended to replace advice given to you by your health care provider. Make sure you discuss any questions you have with your health care provider. Document Revised: 08/23/2020 Document Reviewed: 08/23/2020 Elsevier Patient Education  2022 [employer]. How to Perform the Epley Maneuver The Epley maneuver is an exercise that relieves symptoms of vertigo. Vertigo is the feeling that you or your surroundings are moving when they are not. When you feel vertigo, you may feel like the room is spinning and may have trouble walking. The Epley maneuver is used for a type of vertigo caused by a calcium  deposit in a part of the inner ear. The maneuver involves changing head positions to help the deposit move out of the area. You can do this maneuver at home whenever you have symptoms of vertigo. You can repeat it in 24 hours if your vertigo has not gone away. Even though the Epley maneuver may relieve your vertigo for a few weeks, it is possible that your symptoms will return. This maneuver relieves vertigo, but it does not relieve dizziness. What are the risks? If it is done correctly, the Epley maneuver is considered safe. Sometimes it can lead to dizziness or nausea that goes away after a short time. If you develop other symptoms--such as changes in vision, weakness, or numbness--stop doing the maneuver and call your health care provider. Supplies needed: A bed or table. A pillow. How to do the Epley maneuver   Sit on the edge of a bed or table with your back straight and your legs extended or hanging over the edge of the bed or table. Turn your head halfway toward the affected ear or side as told by your health care provider. Lie backward quickly with your head turned until you are lying flat on your back. Your head should dangle (head-hanging position). You may want to position a pillow under your shoulders. Hold this position for at least 30 seconds. If you feel dizzy or have symptoms of  vertigo, continue to hold the position until the symptoms stop. Turn your head to the opposite direction until your unaffected ear is facing down. Your head should continue to dangle. Hold this position for at least 30 seconds. If you feel dizzy or have symptoms of vertigo, continue to hold the position until the symptoms stop. Turn your whole body to the same side as your head so that you are positioned on your side. Your head will now be nearly facedown and no longer needs to dangle. Hold for at least 30 seconds. If you feel dizzy or have symptoms of vertigo, continue to hold the position until the symptoms  stop. Sit back up. You can repeat the maneuver in 24 hours if your vertigo does not go away. Follow these instructions at home: For 24 hours after doing the Epley maneuver: Keep your head in an upright position. When lying down to sleep or rest, keep your head raised (elevated) with two or more pillows. Avoid excessive neck movements. Activity Do not drive or use machinery if you feel dizzy. After doing the Epley maneuver, return to your normal activities as told by your health care provider. Ask your health care provider what activities are safe for you. General instructions Drink enough fluid to keep your urine pale yellow. Do not drink alcohol. Take over-the-counter and prescription medicines only as told by your health care provider. Keep all follow-up visits. This is important. Preventing vertigo symptoms Ask your health care provider if there is anything you should do at home to prevent vertigo. He or she may recommend that you: Keep your head elevated with two or more pillows while you sleep. Do not sleep on the side of your affected ear. Get up slowly from bed. Avoid sudden movements during the day. Avoid extreme head positions or movement, such as looking up or bending over. Contact a health care provider if: Your vertigo gets worse. You have other symptoms, including: Nausea. Vomiting. Headache. Get help right away if you: Have vision changes. Have a headache or neck pain that is severe or getting worse. Cannot stop vomiting. Have new numbness or weakness in any part of your body. These symptoms may represent a serious problem that is an emergency. Do not wait to see if the symptoms will go away. Get medical help right away. Call your local emergency services (911 in the [place] Do not drive yourself to the hospital. Summary Vertigo is the feeling that you or your surroundings are moving when they are not. The Epley maneuver is an exercise that relieves symptoms of  vertigo. If the Epley maneuver is done correctly, it is considered safe. This information is not intended to replace advice given to you by your health care provider. Make sure you discuss any questions you have with your health care provider. Document Revised: 08/23/2020 Document Reviewed: 08/23/2020 Elsevier Patient Education  2022 [employer].

## 2021-07-23 NOTE — Progress Notes (Signed)
Subjective:  Patient ID: Diane Chang, female    DOB: 1941-06-26  Age: 80 y.o. MRN: [id number]  Chief Complaint  Patient presents with   Hospitalization Follow-up    HPI  Diane Chang is a 80 year old Caucasian female that presents for hospital follow-up for vertigo. She is scheduled to begin PT on 07/30/21. She has continue meclizine PRN.  Follow up Hospitalization  Patient was admitted to [facility] on 07/17/2021 and discharged on 07/18/2021. She was treated for DM, HTN, Cholesterol, BPPV. Treatment for this included Meclizine 25 mg TID. Telephone follow up was done on 07/19/21 She reports excellent compliance with treatment. She reports this condition is improved.   Current Outpatient Medications on File Prior to Visit  Medication Sig Dispense Refill   alendronate (FOSAMAX) 70 MG tablet TAKE ONE TABLET BY MOUTH WEEKLY 4 tablet 10   aspirin EC 81 MG tablet Take 81 mg by mouth daily. Swallow whole.     atorvastatin (LIPITOR) 40 MG tablet TAKE 1 TABLET BY MOUTH EVERY DAY 30 tablet 6   lisinopril-hydrochlorothiazide (ZESTORETIC) 20-12.5 MG tablet TAKE 1 TABLET BY MOUTH EVERY DAY 30 tablet 6   metFORMIN (GLUCOPHAGE) 500 MG tablet TAKE 1 TABLET BY MOUTH EVERY DAY WITH BREAKFAST 30 tablet 2   naproxen (NAPROSYN) 500 MG tablet TAKE 1 TABLET BY MOUTH TWICE A DAY 60 tablet 2   omeprazole (PRILOSEC) 40 MG capsule TAKE 1 CAPSULE BY MOUTH TWICE A DAY BEFORE A MEAL 60 capsule 6   Semaglutide,0.25 or 0.5MG /DOS, (OZEMPIC, 0.25 OR 0.5 MG/DOSE,) 2 MG/1.5ML SOPN Inject 0.5 mg into the skin once a week. 2 mL 6   No current facility-administered medications on file prior to visit.   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   Past Surgical History:  Procedure Laterality Date   CHOLECYSTECTOMY      Family History  Problem Relation Age of Onset   Heart attack Sister    Social History   Socioeconomic History   Marital status: Married    Spouse name: Not on file   Number of children: 1   Years of education: Not on file   Highest education level: Not on file  Occupational History   Occupation: packing  Tobacco Use   Smoking status: Former    Types: Cigarettes    Quit date: 1972    Years since quitting: 50.8   Smokeless tobacco: Never  Substance and Sexual Activity   Alcohol use: Never   Drug use: Never   Sexual activity: Not Currently  Other Topics Concern   Not on file  Social History Narrative   Not on file   Social Determinants of Health   Financial Resource Strain: Not on file  Food Insecurity: Not on file  Transportation Needs: Not on file  Physical Activity: Not on file  Stress: Not on file  Social Connections: Not on file    Review of Systems  Constitutional:  Positive for fatigue. Negative for  chills and fever.  HENT:  Negative for congestion, ear pain, rhinorrhea and sore throat.   Respiratory:  Negative for cough and shortness of breath.   Cardiovascular:  Negative for chest pain.  Gastrointestinal:  Negative for abdominal pain, constipation, diarrhea, nausea and vomiting.  Genitourinary:  Negative for dysuria and urgency.  Musculoskeletal:  Negative for back pain and myalgias.  Neurological:  Positive for weakness. Negative for dizziness, light-headedness and headaches.  Psychiatric/Behavioral:  Negative for dysphoric mood. The patient is not nervous/anxious.     Objective:  Pulse 74    Temp (!) 95.5 F (35.3 C)   Ht 5\' 6"  (1.676 m)   Wt 194 lb (88 kg)   SpO2 99%   BMI 31.31 kg/m   BP/Weight 07/23/2021 01/11/8031 10/08/2480  Systolic BP - 500 370  Diastolic BP - 80 80  Wt. (Lbs) 194 195.2 201.2  BMI 31.31 32.48 33.48    Physical Exam Vitals reviewed.  Constitutional:      Appearance: Normal appearance.  HENT:     Head: Normocephalic.     Right Ear: Tympanic membrane normal.     Left Ear: Tympanic membrane normal.     Nose: Nose normal.     Mouth/Throat:     Mouth: Mucous membranes are moist.  Eyes:     Pupils: Pupils are equal, round, and reactive to light.  Cardiovascular:     Rate and Rhythm: Normal rate and regular rhythm.     Pulses: Normal pulses.     Heart sounds: Normal heart sounds.  Pulmonary:     Effort: Pulmonary effort is normal.     Breath sounds: Normal breath sounds.  Abdominal:     General: Bowel sounds are normal.     Palpations: Abdomen is soft.  Musculoskeletal:        General: Normal range of motion.     Cervical back: Neck supple.  Skin:    General: Skin is warm and dry.     Capillary Refill: Capillary refill takes less than 2 seconds.  Neurological:     General: No focal deficit present.     Mental Status: She is alert and oriented to person, place, and time.  Psychiatric:        Mood and Affect: Mood normal.        Behavior: Behavior normal.        Lab Results  Component Value Date   WBC 5.8 04/02/2021   HGB 13.7 04/02/2021   HCT 41.4 04/02/2021   PLT 232 04/02/2021   GLUCOSE 142 (H) 04/02/2021   CHOL 160 04/02/2021   TRIG 157 (H) 04/02/2021   HDL 42 04/02/2021   LDLCALC 91 04/02/2021   ALT 23 04/02/2021   AST 22 04/02/2021   NA 141 04/02/2021   K 4.8 04/02/2021   CL 103 04/02/2021   CREATININE 1.00 04/02/2021   BUN 21 04/02/2021   CO2 25 04/02/2021   TSH 2.560 04/17/2020   HGBA1C 6.8 (H) 04/02/2021   MICROALBUR 30 12/15/2020      Assessment & Plan:   . 1. Benign paroxysmal positional vertigo  due to bilateral vestibular disorder -continue Meclizine 25 mg TID PRN  -PT as scheduled on 07/30/21 -perform Epley maneuvers daily -fall precautions  2. Bilateral hearing loss, unspecified hearing loss type - Ambulatory referral to Audiology   Continue Meclizine as prescribed Perform Epley maneuvers daily Keep appointment with physical therapy as scheduled Avoid putting alcohol or anything else in ears May return to work  on Tuesday, Oct 25th, 2022 for four hours daily Return for follow-up in 2-weeks     Follow-up: 2-weeks  An After Visit Summary was printed and given to the patient  Rip Harbour, NP [facility] [phone]

## 2021-07-30 ENCOUNTER — Inpatient Hospital Stay: Admission: RE | Admit: 2021-07-30 | Payer: Medicare Other | Source: Ambulatory Visit

## 2021-08-01 ENCOUNTER — Ambulatory Visit: Payer: Medicare Other | Admitting: Legal Medicine

## 2021-08-02 ENCOUNTER — Other Ambulatory Visit: Payer: Self-pay

## 2021-08-02 ENCOUNTER — Encounter: Payer: Self-pay | Admitting: Legal Medicine

## 2021-08-02 ENCOUNTER — Ambulatory Visit (INDEPENDENT_AMBULATORY_CARE_PROVIDER_SITE_OTHER): Payer: Managed Care, Other (non HMO) | Admitting: Legal Medicine

## 2021-08-02 VITALS — BP 118/70 | HR 59 | Temp 97.8°F | Resp 16 | Ht 66.0 in | Wt 191.0 lb

## 2021-08-02 DIAGNOSIS — H8113 Benign paroxysmal vertigo, bilateral: Secondary | ICD-10-CM

## 2021-08-02 NOTE — Progress Notes (Signed)
Subjective:  Patient ID: Diane Chang, female    DOB: [birth year]  Age: 80 y.o. MRN: [id number]  Chief Complaint  Patient presents with   Dizziness    HPI: follow for hospitalization for vertigo.  She is feeling well with no dizziness.  She wants to RTW ful time   Current Outpatient Medications on File Prior to Visit  Medication Sig Dispense Refill   alendronate (FOSAMAX) 70 MG tablet TAKE ONE TABLET BY MOUTH WEEKLY 4 tablet 10   aspirin EC 81 MG tablet Take 81 mg by mouth daily. Swallow whole.     atorvastatin (LIPITOR) 40 MG tablet TAKE 1 TABLET BY MOUTH EVERY DAY 30 tablet 6   lisinopril-hydrochlorothiazide (ZESTORETIC) 20-12.5 MG tablet TAKE 1 TABLET BY MOUTH EVERY DAY 30 tablet 6   meclizine (ANTIVERT) 25 MG tablet Take 1 tablet (25 mg total) by mouth 3 (three) times daily as needed. 30 tablet 2   metFORMIN (GLUCOPHAGE) 500 MG tablet TAKE 1 TABLET BY MOUTH EVERY DAY WITH BREAKFAST 30 tablet 2   naproxen (NAPROSYN) 500 MG tablet TAKE 1 TABLET BY MOUTH TWICE A DAY 60 tablet 2   omeprazole (PRILOSEC) 40 MG capsule TAKE 1 CAPSULE BY MOUTH TWICE A DAY BEFORE A MEAL 60 capsule 6   Semaglutide,0.25 or 0.5MG /DOS, (OZEMPIC, 0.25 OR 0.5 MG/DOSE,) 2 MG/1.5ML SOPN Inject 0.5 mg into the skin once a week. 2 mL 6   No current facility-administered medications on file prior to visit.   Past Medical History:  Diagnosis Date   Abnormal EKG 08/25/2020   Age-related osteoporosis without current pathological fracture    Benign hypertension 12/17/2019   Benign paroxysmal positional vertigo 05/01/2020   BMI 35.0-35.9,adult 04/17/2020   Cardiac murmur 08/25/2020   Chest discomfort 08/25/2020   Chest pain 08/18/2020   DM type 2 with diabetic mixed hyperlipidemia (HCC)    GERD (gastroesophageal reflux disease) 12/17/2019   Mixed hyperlipidemia    Obesity, diabetes, and hypertension syndrome ([place]) 12/17/2019   Osteoarthritis of hip 12/17/2019   Osteoporosis 12/17/2019   Pituitary adenoma ([place])  12/17/2019   Past Surgical History:  Procedure Laterality Date   CHOLECYSTECTOMY      Family History  Problem Relation Age of Onset   Heart attack Sister    Social History   Socioeconomic History   Marital status: Married    Spouse name: Not on file   Number of children: 1   Years of education: Not on file   Highest education level: Not on file  Occupational History   Occupation: packing  Tobacco Use   Smoking status: Former    Types: Cigarettes    Quit date: 1972    Years since quitting: 50.8   Smokeless tobacco: Never  Substance and Sexual Activity   Alcohol use: Never   Drug use: Never   Sexual activity: Not Currently  Other Topics Concern   Not on file  Social History Narrative   Not on file   Social Determinants of Health   Financial Resource Strain: Not on file  Food Insecurity: Not on file  Transportation Needs: Not on file  Physical Activity: Not on file  Stress: Not on file  Social Connections: Not on file    Review of Systems  Constitutional:  Negative for chills, fatigue and fever.  HENT:  Negative for congestion, ear pain and sore throat.   Eyes:  Negative for visual disturbance.  Respiratory:  Negative for cough and shortness of breath.   Cardiovascular:  Negative  for chest pain and palpitations.  Gastrointestinal:  Negative for abdominal pain, constipation, diarrhea, nausea and vomiting.  Endocrine: Negative for polydipsia, polyphagia and polyuria.  Genitourinary:  Negative for difficulty urinating and dysuria.  Musculoskeletal:  Negative for arthralgias, back pain and myalgias.  Skin:  Negative for rash.  Neurological:  Negative for dizziness, weakness and headaches.  Psychiatric/Behavioral:  Negative for dysphoric mood. The patient is not nervous/anxious.     Objective:  BP 118/70   Pulse (!) 59   Temp 97.8 F (36.6 C)   Resp 16   Ht 5\' 6"  (1.676 m)   Wt 191 lb (86.6 kg)   SpO2 98%   BMI 30.83 kg/m   BP/Weight 08/02/2021  99/83/3825 0/02/3975  Systolic BP 734 193 790  Diastolic BP 70 62 80  Wt. (Lbs) 191 194 195.2  BMI 30.83 31.31 32.48    Physical Exam Vitals reviewed.  Constitutional:      General: She is not in acute distress.    Appearance: Normal appearance.  HENT:     Right Ear: Tympanic membrane, ear canal and external ear normal.     Left Ear: Tympanic membrane, ear canal and external ear normal.     Mouth/Throat:     Mouth: Mucous membranes are moist.     Pharynx: Oropharynx is clear.  Eyes:     Extraocular Movements: Extraocular movements intact.     Conjunctiva/sclera: Conjunctivae normal.     Pupils: Pupils are equal, round, and reactive to light.  Cardiovascular:     Rate and Rhythm: Normal rate and regular rhythm.     Pulses: Normal pulses.     Heart sounds: Normal heart sounds. No murmur heard.   No gallop.  Pulmonary:     Effort: Pulmonary effort is normal. No respiratory distress.     Breath sounds: Normal breath sounds. No wheezing.  Abdominal:     General: Abdomen is flat. Bowel sounds are normal. There is no distension.     Palpations: Abdomen is soft.     Tenderness: There is no abdominal tenderness.  Musculoskeletal:        General: Normal range of motion.     Cervical back: Normal range of motion.  Skin:    General: Skin is warm.     Capillary Refill: Capillary refill takes less than 2 seconds.  Neurological:     General: No focal deficit present.     Mental Status: She is alert and oriented to person, place, and time. Mental status is at baseline.     Comments: Negative [place]        Lab Results  Component Value Date   WBC 5.8 04/02/2021   HGB 13.7 04/02/2021   HCT 41.4 04/02/2021   PLT 232 04/02/2021   GLUCOSE 142 (H) 04/02/2021   CHOL 160 04/02/2021   TRIG 157 (H) 04/02/2021   HDL 42 04/02/2021   LDLCALC 91 04/02/2021   ALT 23 04/02/2021   AST 22 04/02/2021   NA 141 04/02/2021   K 4.8 04/02/2021   CL 103 04/02/2021   CREATININE 1.00 04/02/2021    BUN 21 04/02/2021   CO2 25 04/02/2021   TSH 2.560 04/17/2020   HGBA1C 6.8 (H) 04/02/2021   MICROALBUR 30 12/15/2020      Assessment & Plan:   Problem List Items Addressed This Visit       Nervous and Auditory   Benign paroxysmal positional vertigo - Primary  .  Follow-up: Return if symptoms worsen or fail to improve.  An After Visit Summary was printed and given to the patient.  Reinaldo Meeker, MD [facility] [phone]

## 2021-08-08 ENCOUNTER — Ambulatory Visit: Payer: Medicare Other | Admitting: Legal Medicine

## 2021-08-13 ENCOUNTER — Encounter: Payer: Self-pay | Admitting: Legal Medicine

## 2021-08-13 ENCOUNTER — Ambulatory Visit (INDEPENDENT_AMBULATORY_CARE_PROVIDER_SITE_OTHER): Payer: Managed Care, Other (non HMO) | Admitting: Legal Medicine

## 2021-08-13 ENCOUNTER — Other Ambulatory Visit: Payer: Self-pay

## 2021-08-13 VITALS — BP 135/80 | HR 46 | Temp 97.1°F | Resp 16 | Ht 66.0 in | Wt 190.0 lb

## 2021-08-13 DIAGNOSIS — E1169 Type 2 diabetes mellitus with other specified complication: Secondary | ICD-10-CM

## 2021-08-13 DIAGNOSIS — M810 Age-related osteoporosis without current pathological fracture: Secondary | ICD-10-CM | POA: Diagnosis not present

## 2021-08-13 DIAGNOSIS — E669 Obesity, unspecified: Secondary | ICD-10-CM

## 2021-08-13 DIAGNOSIS — E1159 Type 2 diabetes mellitus with other circulatory complications: Secondary | ICD-10-CM

## 2021-08-13 DIAGNOSIS — R5383 Other fatigue: Secondary | ICD-10-CM | POA: Insufficient documentation

## 2021-08-13 DIAGNOSIS — K2100 Gastro-esophageal reflux disease with esophagitis, without bleeding: Secondary | ICD-10-CM

## 2021-08-13 DIAGNOSIS — E782 Mixed hyperlipidemia: Secondary | ICD-10-CM | POA: Diagnosis not present

## 2021-08-13 DIAGNOSIS — I152 Hypertension secondary to endocrine disorders: Secondary | ICD-10-CM

## 2021-08-13 DIAGNOSIS — I10 Essential (primary) hypertension: Secondary | ICD-10-CM | POA: Diagnosis not present

## 2021-08-13 HISTORY — DX: Other fatigue: R53.83

## 2021-08-13 NOTE — Progress Notes (Signed)
Subjective:  Patient ID: Diane Chang, female    DOB: Nov 12, 1940  Age: 80 y.o. MRN: [id number]  Chief Complaint  Patient presents with   Diabetes   Hypertension   Hyperlipidemia    HPI: chronic visit  Patient present with type 2 diabetes.  Specifically, this is type 2, noninsulin requiring diabetes, complicated by hypertension, hypercholesterolemia.  Compliance with treatment has been good; patient take medicines as directed, maintains diet and exercise regimen, follows up as directed, and is keeping glucose diary.  Date of  diagnosis 2010.  Depression screen has been performed.Tobacco screen nonsmoker. Current medicines for diabetes metformin, ozepic 0.5.  Patient is on lisinopril for renal protection and atorvastatin for cholesterol control.  Patient performs foot exams daily and last ophthalmologic exam was had eye exam.   Patient presents for follow up of hypertension.  Patient tolerating lisinopri/HCTZ well with side effects.  Patient was diagnosed with hypertension 2010 so has been treated for hypertension for 12 years.Patient is working on maintaining diet and exercise regimen and follows up as directed. Complication include none.   Patient presents with hyperlipidemia.  Compliance with treatment has been good; patient takes medicines as directed, maintains low cholesterol diet, follows up as directed, and maintains exercise regimen.  Patient is using atorvstatin without problems.   Osteoporosis on alendronate   Current Outpatient Medications on File Prior to Visit  Medication Sig Dispense Refill   alendronate (FOSAMAX) 70 MG tablet TAKE ONE TABLET BY MOUTH WEEKLY 4 tablet 10   aspirin EC 81 MG tablet Take 81 mg by mouth daily. Swallow whole.     atorvastatin (LIPITOR) 40 MG tablet TAKE 1 TABLET BY MOUTH EVERY DAY 30 tablet 6   lisinopril-hydrochlorothiazide (ZESTORETIC) 20-12.5 MG tablet TAKE 1 TABLET BY MOUTH EVERY DAY 30 tablet 6   meclizine (ANTIVERT) 25 MG tablet Take 1  tablet (25 mg total) by mouth 3 (three) times daily as needed. 30 tablet 2   metFORMIN (GLUCOPHAGE) 500 MG tablet TAKE 1 TABLET BY MOUTH EVERY DAY WITH BREAKFAST 30 tablet 2   naproxen (NAPROSYN) 500 MG tablet TAKE 1 TABLET BY MOUTH TWICE A DAY 60 tablet 2   omeprazole (PRILOSEC) 40 MG capsule TAKE 1 CAPSULE BY MOUTH TWICE A DAY BEFORE A MEAL 60 capsule 6   Semaglutide,0.25 or 0.5MG /DOS, (OZEMPIC, 0.25 OR 0.5 MG/DOSE,) 2 MG/1.5ML SOPN Inject 0.5 mg into the skin once a week. 2 mL 6   No current facility-administered medications on file prior to visit.   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   Past Surgical History:  Procedure Laterality Date   CHOLECYSTECTOMY      Family History  Problem Relation Age of Onset   Heart attack Sister    Social History   Socioeconomic History   Marital status: Married    Spouse name: Not on file   Number of children: 1   Years of education: Not on file   Highest education  level: Not on file  Occupational History   Occupation: packing  Tobacco Use   Smoking status: Former    Types: Cigarettes    Quit date: 1972    Years since quitting: 50.8   Smokeless tobacco: Never  Substance and Sexual Activity   Alcohol use: Never   Drug use: Never   Sexual activity: Not Currently  Other Topics Concern   Not on file  Social History Narrative   Not on file   Social Determinants of  Health   Financial Resource Strain: Not on file  Food Insecurity: Not on file  Transportation Needs: Not on file  Physical Activity: Not on file  Stress: Not on file  Social Connections: Not on file    Review of Systems  Constitutional:  Negative for chills, fatigue and fever.  HENT:  Negative for congestion, ear pain and sore throat.   Respiratory:  Negative for cough and shortness of breath.   Cardiovascular:  Negative for chest pain and palpitations.  Gastrointestinal:  Positive for diarrhea (comes and goes). Negative for abdominal pain, constipation, nausea and vomiting.  Endocrine: Negative for polydipsia, polyphagia and polyuria.  Genitourinary:  Negative for difficulty urinating and dysuria.  Musculoskeletal:  Negative for arthralgias, back pain and myalgias.  Skin:  Negative for rash.  Neurological:  Negative for headaches.  Psychiatric/Behavioral:  Negative for dysphoric mood. The patient is not nervous/anxious.     Objective:  BP 135/80   Pulse (!) 46   Temp (!) 97.1 F (36.2 C)   Resp 16   Ht 5\' 6"  (1.676 m)   Wt 190 lb (86.2 kg)   LMP  (LMP Unknown)   SpO2 97%   BMI 30.67 kg/m   BP/Weight 08/13/2021 08/02/2021 74/05/1447  Systolic BP 185 631 497  Diastolic BP 80 70 62  Wt. (Lbs) 190 191 194  BMI 30.67 30.83 31.31    Physical Exam Vitals reviewed.  Constitutional:      General: She is not in acute distress.    Appearance: Normal appearance. She is obese.  HENT:     Head: Normocephalic.     Right Ear: Tympanic membrane, ear canal and external ear normal.     Left Ear: Tympanic membrane, ear canal and external ear normal.     Nose: Nose normal.     Mouth/Throat:     Mouth: Mucous membranes are moist.  Eyes:     Extraocular Movements: Extraocular movements intact.     Conjunctiva/sclera: Conjunctivae normal.     Pupils: Pupils are equal, round, and reactive to light.  Cardiovascular:     Rate and Rhythm: Normal rate and regular rhythm.     Pulses:  Normal pulses.     Heart sounds: Normal heart sounds. No murmur heard.   No gallop.  Pulmonary:     Effort: Pulmonary effort is normal. No respiratory distress.     Breath sounds: Normal breath sounds. No wheezing.  Abdominal:     General: Abdomen is flat. Bowel sounds are normal. There is no distension.     Palpations: Abdomen is soft.     Tenderness: There is no abdominal tenderness.  Musculoskeletal:        General: Normal range of motion.     Cervical back: Normal range of motion and neck supple.  Skin:    General: Skin is warm and dry.     Capillary Refill: Capillary refill takes less than 2 seconds.  Neurological:     General: No focal  deficit present.     Mental Status: She is alert and oriented to person, place, and time. Mental status is at baseline.  Psychiatric:        Mood and Affect: Mood normal.    Diabetic Foot Exam - Simple   Simple Foot Form Diabetic Foot exam was performed with the following findings: Yes 08/13/2021  1:26 PM  Visual Inspection No deformities, no ulcerations, no other skin breakdown bilaterally: Yes Sensation Testing Intact to touch and monofilament testing bilaterally: Yes Pulse Check Posterior Tibialis and Dorsalis pulse intact bilaterally: Yes Comments      Lab Results  Component Value Date   WBC 5.8 04/02/2021   HGB 13.7 04/02/2021   HCT 41.4 04/02/2021   PLT 232 04/02/2021   GLUCOSE 142 (H) 04/02/2021   CHOL 160 04/02/2021   TRIG 157 (H) 04/02/2021   HDL 42 04/02/2021   LDLCALC 91 04/02/2021   ALT 23 04/02/2021   AST 22 04/02/2021   NA 141 04/02/2021   K 4.8 04/02/2021   CL 103 04/02/2021   CREATININE 1.00 04/02/2021   BUN 21 04/02/2021   CO2 25 04/02/2021   TSH 2.560 04/17/2020   HGBA1C 6.8 (H) 04/02/2021   MICROALBUR 30 12/15/2020      Assessment & Plan:   Problem List Items Addressed This Visit       Cardiovascular and Mediastinum   Benign hypertension   Relevant Orders   Comprehensive metabolic panel    CBC with Differential/Platelet An individual hypertension care plan was established and reinforced today.  The patient's status was assessed using clinical findings on exam and labs or diagnostic tests. The patient's success at meeting treatment goals on disease specific evidence-based guidelines and found to be well controlled. SELF MANAGEMENT: The patient and I together assessed ways to personally work towards obtaining the recommended goals. RECOMMENDATIONS: avoid decongestants found in common cold remedies, decrease consumption of alcohol, perform routine monitoring of BP with home BP cuff, exercise, reduction of dietary salt, take medicines as prescribed, try not to miss doses and quit smoking.  Regular exercise and maintaining a healthy weight is needed.  Stress reduction may help. A CLINICAL SUMMARY including written plan identify barriers to care unique to individual due to social or financial issues.  We attempt to mutually creat solutions for individual and family understanding.     Obesity, diabetes, and hypertension syndrome ([place]) - Primary   Relevant Orders   Hemoglobin A1c An individual care plan for diabetes was established and reinforced today.  The patient's status was assessed using clinical findings on exam, labs and diagnostic testing. Patient success at meeting goals based on disease specific evidence-based guidelines and found to be good controlled. Medications were assessed and patient's understanding of the medical issues , including barriers were assessed. Recommend adherence to a diabetic diet, a graduated exercise program, HgbA1c level is checked quarterly, and urine microalbumin performed yearly .  Annual mono-filament sensation testing performed. Lower blood pressure and control hyperlipidemia is important. Get annual eye exams and annual flu shots and smoking cessation discussed.  Self management goals were discussed.      Digestive   GERD (gastroesophageal reflux  disease) Plan of care was formulated today.  She is doing well.  A plan of care was formulated using patient exam, tests and other sources to optimize care using evidence based information.  Recommend no smoking, no eating after supper, avoid fatty foods, elevate Head of bed, avoid tight fitting clothing.  Continue on omeprazole.  Musculoskeletal and Integument   Age-related osteoporosis without current pathological fracture AN INDIVIDUAL CARE PLAN for osteoporosis was established and reinforced today.  The patient's status was assessed using clinical findings on exam, labs, and other diagnostic testing. Patient's success at meeting treatment goals based on disease specific evidence-bassed guidelines and found to be in good control. RECOMMENDATIONS include maintaining present medicines and treatment.      Other   Mixed hyperlipidemia   Relevant Orders   Lipid panel AN INDIVIDUAL CARE PLAN for hyperlipidemia/ cholesterol was established and reinforced today.  The patient's status was assessed using clinical findings on exam, lab and other diagnostic tests. The patient's disease status was assessed based on evidence-based guidelines and found to be well controlled. MEDICATIONS were reviewed. SELF MANAGEMENT GOALS have been discussed and patient's success at attaining the goal of low cholesterol was assessed. RECOMMENDATION given include regular exercise 3 days a week and low cholesterol/low fat diet. CLINICAL SUMMARY including written plan to identify barriers unique to the patient due to social or economic  reasons was discussed.    Fatigue   Relevant Orders   TSH check thyroid tests Patient I having progressive fatigue  .     Orders Placed This Encounter  Procedures   Comprehensive metabolic panel   Hemoglobin A1c   Lipid panel   CBC with Differential/Platelet   TSH     Follow-up: Return in about 4 months (around 12/11/2021) for fasting.  An After Visit Summary was printed and  given to the patient.  Reinaldo Meeker, MD [facility] [phone]

## 2021-08-14 LAB — LIPID PANEL
Chol/HDL Ratio: 3.8 ratio (ref 0.0–4.4)
Cholesterol, Total: 153 mg/dL (ref 100–199)
HDL: 40 mg/dL (ref >39)
LDL Chol Calc (NIH): 91 mg/dL (ref 0–99)
Triglycerides: 123 mg/dL (ref 0–149)
VLDL Cholesterol Cal: 22 mg/dL (ref 5–40)

## 2021-08-14 LAB — CBC WITH DIFFERENTIAL/PLATELET
Basophils Absolute: 0.1 10*3/uL (ref 0.0–0.2)
Basos: 2 %
EOS (ABSOLUTE): 0.2 10*3/uL (ref 0.0–0.4)
Eos: 4 %
Hematocrit: 41.5 % (ref 34.0–46.6)
Hemoglobin: 13.6 g/dL (ref 11.1–15.9)
Immature Grans (Abs): 0 10*3/uL (ref 0.0–0.1)
Immature Granulocytes: 0 %
Lymphocytes Absolute: 1.9 10*3/uL (ref 0.7–3.1)
Lymphs: 35 %
MCH: 32.7 pg (ref 26.6–33.0)
MCHC: 32.8 g/dL (ref 31.5–35.7)
MCV: 100 fL — ABNORMAL HIGH (ref 79–97)
Monocytes Absolute: 0.4 10*3/uL (ref 0.1–0.9)
Monocytes: 7 %
Neutrophils Absolute: 2.8 10*3/uL (ref 1.4–7.0)
Neutrophils: 52 %
Platelets: 281 10*3/uL (ref 150–450)
RBC: 4.16 x10E6/uL (ref 3.77–5.28)
RDW: 12.3 % (ref 11.7–15.4)
WBC: 5.4 10*3/uL (ref 3.4–10.8)

## 2021-08-14 LAB — COMPREHENSIVE METABOLIC PANEL
ALT: 19 IU/L (ref 0–32)
AST: 20 IU/L (ref 0–40)
Albumin/Globulin Ratio: 2 (ref 1.2–2.2)
Albumin: 4.1 g/dL (ref 3.7–4.7)
Alkaline Phosphatase: 51 IU/L (ref 44–121)
BUN/Creatinine Ratio: 15 (ref 12–28)
BUN: 13 mg/dL (ref 8–27)
Bilirubin Total: 0.4 mg/dL (ref 0.0–1.2)
CO2: 25 mmol/L (ref 20–29)
Calcium: 9.9 mg/dL (ref 8.7–10.3)
Chloride: 103 mmol/L (ref 96–106)
Creatinine, Ser: 0.88 mg/dL (ref 0.57–1.00)
Globulin, Total: 2.1 g/dL (ref 1.5–4.5)
Glucose: 142 mg/dL — ABNORMAL HIGH (ref 70–99)
Potassium: 4.4 mmol/L (ref 3.5–5.2)
Sodium: 138 mmol/L (ref 134–144)
Total Protein: 6.2 g/dL (ref 6.0–8.5)
eGFR: 67 mL/min/{1.73_m2} (ref >59)

## 2021-08-14 LAB — CARDIOVASCULAR RISK ASSESSMENT

## 2021-08-14 LAB — TSH: TSH: 1.66 u[IU]/mL (ref 0.450–4.500)

## 2021-08-14 LAB — HEMOGLOBIN A1C
Est. average glucose Bld gHb Est-mCnc: 131 mg/dL
Hgb A1c MFr Bld: 6.2 % — ABNORMAL HIGH (ref 4.8–5.6)

## 2021-08-14 NOTE — Progress Notes (Signed)
TSH 1.66, normal, glucose 142, kidney tests normal liver tests normal, A1c 6.2 good, Liver normal, CBC normal,  lp

## 2021-09-07 ENCOUNTER — Other Ambulatory Visit: Payer: Self-pay | Admitting: Legal Medicine

## 2021-09-07 DIAGNOSIS — K2100 Gastro-esophageal reflux disease with esophagitis, without bleeding: Secondary | ICD-10-CM

## 2021-10-22 ENCOUNTER — Ambulatory Visit: Payer: Medicare Other | Admitting: Cardiology

## 2021-10-23 ENCOUNTER — Encounter: Payer: Self-pay | Admitting: Legal Medicine

## 2021-10-23 ENCOUNTER — Telehealth (INDEPENDENT_AMBULATORY_CARE_PROVIDER_SITE_OTHER): Payer: Managed Care, Other (non HMO) | Admitting: Legal Medicine

## 2021-10-23 VITALS — Ht 66.0 in | Wt 190.0 lb

## 2021-10-23 DIAGNOSIS — R197 Diarrhea, unspecified: Secondary | ICD-10-CM

## 2021-10-23 MED ORDER — DIPHENOXYLATE-ATROPINE 2.5-0.025 MG PO TABS: 1.0000 | ORAL_TABLET | Freq: Four times a day (QID) | ORAL | 0 refills | Status: DC | PRN

## 2021-10-23 NOTE — Progress Notes (Signed)
Virtual Visit via Video Note   This visit type was conducted due to national recommendations for restrictions regarding the COVID-19 Pandemic (e.g. social distancing) in an effort to limit this patient's exposure and mitigate transmission in our community.  Due to her co-morbid illnesses, this patient is at least at moderate risk for complications without adequate follow up.  This format is felt to be most appropriate for this patient at this time.  All issues noted in this document were discussed and addressed.  A limited physical exam was performed with this format.  A verbal consent was obtained for the virtual visit.   Date:  10/23/2021   ID:  Diane Chang, DOB 1940/12/18, MRN [id number]  Patient Location: Home Provider Location: Office/Clinic  PCP:  Lillard Anes, MD   Evaluation Performed:  New Patient Evaluation  Chief Complaint:  diarrhea  History of Present Illness:    Diane Chang is a 81 y.o. female with diarrhea and vomiting since 5 days ago. She mentioned that she has BM several times in the day. She feels weak. She was constipated last week and took stool softener and started diarrhea, no dining out.  The patient does not have symptoms concerning for COVID-19 infection (fever, chills, cough, or new shortness of breath).    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    Past Surgical History:  Procedure  Laterality Date   CHOLECYSTECTOMY      Family History  Problem Relation Age of Onset   Heart attack Sister     Social History   Socioeconomic History   Marital status: Married    Spouse name: Not on file   Number of children: 1   Years of education: Not on file   Highest education level: Not on file  Occupational History   Occupation: packing  Tobacco Use   Smoking status: Former    Types: Cigarettes    Quit date: 1972    Years since quitting: 51.0   Smokeless tobacco: Never  Substance and Sexual Activity   Alcohol use: Never   Drug use: Never   Sexual activity: Not Currently  Other Topics Concern   Not on file  Social History Narrative   Not on file   Social Determinants of Health   Financial Resource Strain: Not on file  Food Insecurity: Not on file  Transportation Needs: Not on file  Physical Activity: Not on file  Stress: Not on file  Social Connections: Not on file  Intimate Partner Violence: Not on file    Outpatient Medications Prior to Visit  Medication Sig Dispense Refill   alendronate (FOSAMAX) 70 MG tablet TAKE ONE TABLET BY MOUTH WEEKLY 4 tablet 10   aspirin EC 81 MG tablet Take 81 mg by mouth daily. Swallow whole.     atorvastatin (LIPITOR) 40 MG tablet TAKE 1 TABLET BY MOUTH EVERY DAY 30 tablet 6   lisinopril-hydrochlorothiazide (ZESTORETIC) 20-12.5 MG tablet TAKE 1 TABLET BY MOUTH EVERY DAY 30  tablet 6   meclizine (ANTIVERT) 25 MG tablet Take 1 tablet (25 mg total) by mouth 3 (three) times daily as needed. 30 tablet 2   metFORMIN (GLUCOPHAGE) 500 MG tablet TAKE 1 TABLET BY MOUTH EVERY DAY WITH BREAKFAST 30 tablet 2   naproxen (NAPROSYN) 500 MG tablet TAKE 1 TABLET BY MOUTH TWICE A DAY 60 tablet 2   omeprazole (PRILOSEC) 40 MG capsule TAKE 1 CAPSULE BY MOUTH TWICE A DAY BEFORE A MEAL 60 capsule 6   Semaglutide,0.25 or 0.5MG /DOS, (OZEMPIC, 0.25 OR 0.5 MG/DOSE,) 2 MG/1.5ML SOPN Inject 0.5 mg into the skin once a week. 2 mL 6   No  facility-administered medications prior to visit.    Allergies:   Patient has no known allergies.   Social History   Tobacco Use   Smoking status: Former    Types: Cigarettes    Quit date: 1972    Years since quitting: 51.0   Smokeless tobacco: Never  Substance Use Topics   Alcohol use: Never   Drug use: Never     Review of Systems  Constitutional:  Negative for chills and fever.  HENT:  Negative for ear pain and sore throat.   Respiratory:  Negative for cough.   Cardiovascular:  Negative for chest pain.  Gastrointestinal:  Positive for diarrhea, nausea and vomiting.    Labs/Other Tests and Data Reviewed:    Recent Labs: 08/13/2021: ALT 19; BUN 13; Creatinine, Ser 0.88; Hemoglobin 13.6; Platelets 281; Potassium 4.4; Sodium 138; TSH 1.660   Recent Lipid Panel Lab Results  Component Value Date/Time   CHOL 153 08/13/2021 08:42 AM   TRIG 123 08/13/2021 08:42 AM   HDL 40 08/13/2021 08:42 AM   CHOLHDL 3.8 08/13/2021 08:42 AM   LDLCALC 91 08/13/2021 08:42 AM    Wt Readings from Last 3 Encounters:  10/23/21 190 lb (86.2 kg)  08/13/21 190 lb (86.2 kg)  08/02/21 191 lb (86.6 kg)     Objective:    Vital Signs:  Ht 5\' 6"  (1.676 m)    Wt 190 lb (86.2 kg)    LMP  (LMP Unknown)    BMI 30.67 kg/m    Physical Exam reviewed  ASSESSMENT & PLAN:   Diagnoses and all orders for this visit: Diarrhea of presumed infectious origin -     diphenoxylate-atropine (LOMOTIL) 2.5-0.025 MG tablet; Take 1 tablet by mouth 4 (four) times daily as needed for diarrhea or loose stools. Patient has had watery diarrhea for the last 4 days.  Sudden onset without any temperature and minimal vomiting.  She has not been around anybody who had this kind of infection.  I struck strongly recommended she drink Pedialyte or Gatorade to help rehydrate her and called in Lomotil for the diarrhea.  Note for work has been drawn.       COVID-19 Education: The signs and symptoms of COVID-19 were discussed with  the patient and how to seek care for testing (follow up with PCP or arrange E-visit). The importance of social distancing was discussed today.   I spent 55minutes dedicated to the care of this patient on the date of this encounter to include face-to-face time with the patient, as well as:   Follow Up:  In Person prn  Signed, Reinaldo Meeker, MD  10/23/2021 2:07 PM    [place]

## 2021-12-12 ENCOUNTER — Encounter: Payer: Self-pay | Admitting: Legal Medicine

## 2021-12-12 ENCOUNTER — Other Ambulatory Visit: Payer: Self-pay

## 2021-12-12 ENCOUNTER — Ambulatory Visit (INDEPENDENT_AMBULATORY_CARE_PROVIDER_SITE_OTHER): Payer: Managed Care, Other (non HMO) | Admitting: Legal Medicine

## 2021-12-12 VITALS — BP 120/70 | HR 60 | Temp 98.0°F | Resp 15 | Ht 62.0 in | Wt 185.0 lb

## 2021-12-12 DIAGNOSIS — E1169 Type 2 diabetes mellitus with other specified complication: Secondary | ICD-10-CM

## 2021-12-12 DIAGNOSIS — K2100 Gastro-esophageal reflux disease with esophagitis, without bleeding: Secondary | ICD-10-CM

## 2021-12-12 DIAGNOSIS — I10 Essential (primary) hypertension: Secondary | ICD-10-CM

## 2021-12-12 DIAGNOSIS — M160 Bilateral primary osteoarthritis of hip: Secondary | ICD-10-CM

## 2021-12-12 DIAGNOSIS — D352 Benign neoplasm of pituitary gland: Secondary | ICD-10-CM

## 2021-12-12 DIAGNOSIS — I152 Hypertension secondary to endocrine disorders: Secondary | ICD-10-CM

## 2021-12-12 DIAGNOSIS — Z1211 Encounter for screening for malignant neoplasm of colon: Secondary | ICD-10-CM

## 2021-12-12 DIAGNOSIS — E669 Obesity, unspecified: Secondary | ICD-10-CM

## 2021-12-12 DIAGNOSIS — E782 Mixed hyperlipidemia: Secondary | ICD-10-CM

## 2021-12-12 DIAGNOSIS — M810 Age-related osteoporosis without current pathological fracture: Secondary | ICD-10-CM

## 2021-12-12 DIAGNOSIS — F322 Major depressive disorder, single episode, severe without psychotic features: Secondary | ICD-10-CM | POA: Insufficient documentation

## 2021-12-12 DIAGNOSIS — E1159 Type 2 diabetes mellitus with other circulatory complications: Secondary | ICD-10-CM

## 2021-12-12 MED ORDER — METFORMIN HCL 500 MG PO TABS: ORAL_TABLET | ORAL | 2 refills | Status: DC

## 2021-12-12 MED ORDER — LISINOPRIL-HYDROCHLOROTHIAZIDE 20-12.5 MG PO TABS: 1.0000 | ORAL_TABLET | Freq: Every day | ORAL | 2 refills | Status: DC

## 2021-12-12 MED ORDER — TRULICITY 1.5 MG/0.5ML ~~LOC~~ SOAJ: 1.5000 mg | SUBCUTANEOUS | 2 refills | Status: DC

## 2021-12-12 MED ORDER — ALENDRONATE SODIUM 70 MG PO TABS: 70.0000 mg | ORAL_TABLET | ORAL | 2 refills | Status: DC

## 2021-12-12 MED ORDER — ATORVASTATIN CALCIUM 40 MG PO TABS: 40.0000 mg | ORAL_TABLET | Freq: Every day | ORAL | 2 refills | Status: DC

## 2021-12-12 MED ORDER — NAPROXEN 500 MG PO TABS: 500.0000 mg | ORAL_TABLET | Freq: Two times a day (BID) | ORAL | 2 refills | Status: DC

## 2021-12-12 MED ORDER — OMEPRAZOLE 40 MG PO CPDR: DELAYED_RELEASE_CAPSULE | ORAL | 6 refills | Status: DC

## 2021-12-12 NOTE — Progress Notes (Signed)
Subjective:  Patient ID: Diane Chang, female    DOB: 1940/12/08  Age: 81 y.o. MRN: [id number]  Chief Complaint  Patient presents with   Diabetes   Hypertension   Hyperlipidemia    HPI Patient present with type 2 diabetes.  Specifically, this is type 2 non insulin. Compliance with treatment has been good; patient take medicines as directed, maintains diet and exercise regimen, follows up as directed, and is keeping glucose diary. Current medicines for diabetes Metformin 500 mg every day with breakfast, Ozempic 0.5 mg weekly.  Patient performs foot exams daily and last ophthalmologic exam was 04/12/2021. Last A1C 6.2%.   Patient presents for follow up of hypertension.  Patient tolerating well without side effects Aspirin 81 mg daily, lisinopril-HTCZ 80-12.5 mg daily.  Patient is working on maintaining diet and exercise regimen and follows up as directed.   Hyperlipidemia: She takes Atorvastatin 40 mg daily. Current Outpatient Medications on File Prior to Visit  Medication Sig Dispense Refill   aspirin EC 81 MG tablet Take 81 mg by mouth daily. Swallow whole.     diphenoxylate-atropine (LOMOTIL) 2.5-0.025 MG tablet Take 1 tablet by mouth 4 (four) times daily as needed for diarrhea or loose stools. 30 tablet 0   meclizine (ANTIVERT) 25 MG tablet Take 1 tablet (25 mg total) by mouth 3 (three) times daily as needed. 30 tablet 2   No current facility-administered medications on file prior to visit.   Past Medical History:  Diagnosis Date   Age-related osteoporosis without current pathological fracture    Benign hypertension 12/17/2019   Benign paroxysmal positional vertigo 05/01/2020   BMI 32.0-32.9,adult 04/17/2020   BMI 35.0-35.9,adult 04/17/2020   Cardiac murmur 08/25/2020   DM type 2 with diabetic mixed hyperlipidemia (HCC)    Fatigue 08/13/2021   GERD (gastroesophageal reflux disease) 12/17/2019   Mixed hyperlipidemia    Obesity, diabetes, and hypertension syndrome ([place]) 12/17/2019    Osteoarthritis of hip 12/17/2019   Osteoporosis 12/17/2019   Pituitary adenoma ([place]) 12/17/2019   Past Surgical History:  Procedure Laterality Date   CHOLECYSTECTOMY      Family History  Problem Relation Age of Onset   Heart attack Sister    Social History   Socioeconomic History   Marital status: Married    Spouse name: Not on file   Number of children: 1   Years of education: Not on file   Highest education level: Not on file  Occupational History   Occupation: packing  Tobacco Use   Smoking status: Former    Types: Cigarettes    Quit date: 1972    Years since quitting: 51.2   Smokeless tobacco: Never  Substance and Sexual Activity   Alcohol use: Never   Drug use: Never   Sexual activity: Not Currently  Other Topics Concern   Not on file  Social History Narrative   Not on file   Social Determinants of Health   Financial Resource Strain: Not on file  Food Insecurity: Not on file  Transportation Needs: Not on file  Physical Activity: Not on file  Stress: Not on file  Social Connections: Not on file    Review of Systems  Constitutional:  Negative for chills, fatigue and fever.  HENT:  Negative for congestion, ear pain and sore throat.   Respiratory:  Negative for cough and shortness of breath.   Cardiovascular:  Negative for chest pain and palpitations.  Gastrointestinal:  Negative for abdominal pain, constipation, diarrhea, nausea and vomiting.  Endocrine:  Negative for polydipsia, polyphagia and polyuria.  Genitourinary:  Negative for difficulty urinating and dysuria.  Musculoskeletal:  Negative for arthralgias, back pain and myalgias.  Skin:  Negative for rash.  Neurological:  Negative for headaches.  Psychiatric/Behavioral:  Negative for dysphoric mood. The patient is not nervous/anxious.     Objective:  BP 120/70    Pulse 60    Temp 98 F (36.7 C)    Resp 15    Ht '5\' 2"'$  (1.575 m)    Wt 185 lb (83.9 kg)    LMP  (LMP Unknown)    SpO2 97%    BMI 33.84  kg/m   BP/Weight 12/12/2021 10/23/2021 68/10/1570  Systolic BP 620 - 355  Diastolic BP 70 - 80  Wt. (Lbs) 185 190 190  BMI 33.84 30.67 30.67    Physical Exam Vitals reviewed.  Constitutional:      General: She is not in acute distress.    Appearance: Normal appearance. She is obese.  HENT:     Head: Normocephalic.     Right Ear: Tympanic membrane normal.     Left Ear: Tympanic membrane normal.     Nose: Nose normal.     Mouth/Throat:     Pharynx: Oropharynx is clear.  Eyes:     Conjunctiva/sclera: Conjunctivae normal.     Pupils: Pupils are equal, round, and reactive to light.  Cardiovascular:     Rate and Rhythm: Normal rate and regular rhythm.     Heart sounds: No murmur heard.   No gallop.  Pulmonary:     Effort: Pulmonary effort is normal. No respiratory distress.     Breath sounds: Normal breath sounds. No wheezing.  Abdominal:     General: Abdomen is flat. Bowel sounds are normal. There is no distension.     Tenderness: There is no abdominal tenderness.  Musculoskeletal:     Right lower leg: No edema.     Left lower leg: No edema.  Skin:    General: Skin is warm.     Capillary Refill: Capillary refill takes less than 2 seconds.  Neurological:     General: No focal deficit present.     Mental Status: She is alert and oriented to person, place, and time. Mental status is at baseline.  Psychiatric:        Mood and Affect: Mood normal.        Thought Content: Thought content normal.    Diabetic Foot Exam - Simple   Simple Foot Form Diabetic Foot exam was performed with the following findings: Yes 12/12/2021  9:06 AM  Visual Inspection See comments: Yes Sensation Testing Intact to touch and monofilament testing bilaterally: Yes Pulse Check Posterior Tibialis and Dorsalis pulse intact bilaterally: Yes Comments bunions      Lab Results  Component Value Date   WBC 5.4 08/13/2021   HGB 13.6 08/13/2021   HCT 41.5 08/13/2021   PLT 281 08/13/2021   GLUCOSE 142  (H) 08/13/2021   CHOL 153 08/13/2021   TRIG 123 08/13/2021   HDL 40 08/13/2021   LDLCALC 91 08/13/2021   ALT 19 08/13/2021   AST 20 08/13/2021   NA 138 08/13/2021   K 4.4 08/13/2021   CL 103 08/13/2021   CREATININE 0.88 08/13/2021   BUN 13 08/13/2021   CO2 25 08/13/2021   TSH 1.660 08/13/2021   HGBA1C 6.2 (H) 08/13/2021   MICROALBUR 30 12/15/2020      Assessment & Plan:   Problem List Items Addressed  This Visit       Cardiovascular and Mediastinum   Benign hypertension - Primary   Relevant Medications   lisinopril-hydrochlorothiazide (ZESTORETIC) 20-12.5 MG tablet   atorvastatin (LIPITOR) 40 MG tablet   Other Relevant Orders   Comprehensive metabolic panel   CBC with Differential/Platelet   AMB Referral to [place] An individual hypertension care plan was established and reinforced today.  The patient's status was assessed using clinical findings on exam and labs or diagnostic tests. The patient's success at meeting treatment goals on disease specific evidence-based guidelines and found to be fair controlled. SELF MANAGEMENT: The patient and I together assessed ways to personally work towards obtaining the recommended goals. RECOMMENDATIONS: avoid decongestants found in common cold remedies, decrease consumption of alcohol, perform routine monitoring of BP with home BP cuff, exercise, reduction of dietary salt, take medicines as prescribed, try not to miss doses and quit smoking.  Regular exercise and maintaining a healthy weight is needed.  Stress reduction may help. A CLINICAL SUMMARY including written plan identify barriers to care unique to individual due to social or financial issues.  We attempt to mutually creat solutions for individual and family understanding.    Obesity, diabetes, and hypertension syndrome (HCC)   Relevant Medications   Dulaglutide (TRULICITY) 1.5 VO/1.6WV SOPN   metFORMIN (GLUCOPHAGE) 500 MG tablet    lisinopril-hydrochlorothiazide (ZESTORETIC) 20-12.5 MG tablet   atorvastatin (LIPITOR) 40 MG tablet   Other Relevant Orders   Hemoglobin A1c   PSA   AMB Referral to [place] An individual care plan for diabetes was established and reinforced today.  The patient's status was assessed using clinical findings on exam, labs and diagnostic testing. Patient success at meeting goals based on disease specific evidence-based guidelines and found to be fair controlled. Changed to trulicity because insurance will not pay for ozempic. Medications were assessed and patient's understanding of the medical issues , including barriers were assessed. Recommend adherence to a diabetic diet, a graduated exercise program, HgbA1c level is checked quarterly, and urine microalbumin performed yearly .  Annual mono-filament sensation testing performed. Lower blood pressure and control hyperlipidemia is important. Get annual eye exams and annual flu shots and smoking cessation discussed.  Self management goals were discussed.      Digestive   GERD (gastroesophageal reflux disease)   Relevant Medications   omeprazole (PRILOSEC) 40 MG capsule Plan of care was formulated today.  She is doing well.  A plan of care was formulated using patient exam, tests and other sources to optimize care using evidence based information.  Recommend no smoking, no eating after supper, avoid fatty foods, elevate Head of bed, avoid tight fitting clothing.  Continue on OTC PRN.      Endocrine   Pituitary adenoma (HCC) No changes, was seen at [facility]    DM type 2 with diabetic mixed hyperlipidemia (HCC)   Relevant Medications   Dulaglutide (TRULICITY) 1.5 PX/1.0GY SOPN   metFORMIN (GLUCOPHAGE) 500 MG tablet   lisinopril-hydrochlorothiazide (ZESTORETIC) 20-12.5 MG tablet   atorvastatin (LIPITOR) 40 MG tablet   Other Relevant Orders   AMB Referral to [place] An individual care plan for diabetes was  established and reinforced today.  The patient's status was assessed using clinical findings on exam, labs and diagnostic testing. Patient success at meeting goals based on disease specific evidence-based guidelines and found to be fair controlled. Medications were assessed and patient's understanding of the medical issues , including barriers were assessed. Recommend adherence  to a diabetic diet, a graduated exercise program, HgbA1c level is checked quarterly, and urine microalbumin performed yearly .  Annual mono-filament sensation testing performed. Lower blood pressure and control hyperlipidemia is important. Get annual eye exams and annual flu shots and smoking cessation discussed.  Self management goals were discussed.      Musculoskeletal and Integument   Osteoarthritis of hip   Relevant Medications   naproxen (NAPROSYN) 500 MG tablet Osteoarthritis hip under control with naprosyn    Age-related osteoporosis without current pathological fracture   Relevant Medications   alendronate (FOSAMAX) 70 MG tablet Patient has osteoporosis now on calcium and vitamin d     Other   Mixed hyperlipidemia   Relevant Medications   lisinopril-hydrochlorothiazide (ZESTORETIC) 20-12.5 MG tablet   atorvastatin (LIPITOR) 40 MG tablet   Other Relevant Orders   Lipid panel   AMB Referral to [place] for hyperlipidemia/ cholesterol was established and reinforced today.  The patient's status was assessed using clinical findings on exam, lab and other diagnostic tests. The patient's disease status was assessed based on evidence-based guidelines and found to be fair controlled. MEDICATIONS were reviewed. SELF MANAGEMENT GOALS have been discussed and patient's success at attaining the goal of low cholesterol was assessed. RECOMMENDATION given include regular exercise 3 days a week and low cholesterol/low fat diet. CLINICAL SUMMARY including written plan to identify  barriers unique to the patient due to social or economic  reasons was discussed.    Depression, major, single episode, severe ([place]) Depression is doing well without medicines   Other Visit Diagnoses     Colon cancer screening       Relevant Orders   Ambulatory referral to Gastroenterology     .  Meds ordered this encounter  Medications   Dulaglutide (TRULICITY) 1.5 JS/2.8BT SOPN    Sig: Inject 1.5 mg into the skin once a week.    Dispense:  6 mL    Refill:  2   omeprazole (PRILOSEC) 40 MG capsule    Sig: TAKE 1 CAPSULE BY MOUTH TWICE A DAY BEFORE A MEAL    Dispense:  60 capsule    Refill:  6    DX Code Needed  .   metFORMIN (GLUCOPHAGE) 500 MG tablet    Sig: TAKE 1 TABLET BY MOUTH EVERY DAY WITH BREAKFAST    Dispense:  30 tablet    Refill:  2   naproxen (NAPROSYN) 500 MG tablet    Sig: Take 1 tablet (500 mg total) by mouth 2 (two) times daily.    Dispense:  60 tablet    Refill:  2    DX Code Needed  .   lisinopril-hydrochlorothiazide (ZESTORETIC) 20-12.5 MG tablet    Sig: Take 1 tablet by mouth daily.    Dispense:  90 tablet    Refill:  2    DX Code Needed  .   atorvastatin (LIPITOR) 40 MG tablet    Sig: Take 1 tablet (40 mg total) by mouth daily.    Dispense:  90 tablet    Refill:  2    DX Code Needed  .   alendronate (FOSAMAX) 70 MG tablet    Sig: Take 1 tablet (70 mg total) by mouth once a week. Take with a full glass of water on an empty stomach.    Dispense:  12 tablet    Refill:  2    Orders Placed This Encounter  Procedures   Comprehensive metabolic panel  Hemoglobin A1c   Lipid panel   CBC with Differential/Platelet   PSA   Ambulatory referral to Gastroenterology   AMB Referral to [place]   30 minute visit with renewal of al medicines and changing to trulicity, old records reviewed  Follow-up: Return in about 3 months (around 03/14/2022) for fasting with shannon.  An After Visit Summary was printed and given to the  patient.  Reinaldo Meeker, MD [facility] [phone]

## 2021-12-13 ENCOUNTER — Telehealth: Payer: Self-pay | Admitting: Nurse Practitioner

## 2021-12-13 LAB — COMPREHENSIVE METABOLIC PANEL
ALT: 22 IU/L (ref 0–32)
AST: 26 IU/L (ref 0–40)
Albumin/Globulin Ratio: 1.7 (ref 1.2–2.2)
Albumin: 4.3 g/dL (ref 3.7–4.7)
Alkaline Phosphatase: 59 IU/L (ref 44–121)
BUN/Creatinine Ratio: 20 (ref 12–28)
BUN: 16 mg/dL (ref 8–27)
Bilirubin Total: 0.5 mg/dL (ref 0.0–1.2)
CO2: 26 mmol/L (ref 20–29)
Calcium: 9.9 mg/dL (ref 8.7–10.3)
Chloride: 103 mmol/L (ref 96–106)
Creatinine, Ser: 0.81 mg/dL (ref 0.57–1.00)
Globulin, Total: 2.5 g/dL (ref 1.5–4.5)
Glucose: 118 mg/dL — ABNORMAL HIGH (ref 70–99)
Potassium: 4.7 mmol/L (ref 3.5–5.2)
Sodium: 141 mmol/L (ref 134–144)
Total Protein: 6.8 g/dL (ref 6.0–8.5)
eGFR: 74 mL/min/{1.73_m2} (ref >59)

## 2021-12-13 LAB — CBC WITH DIFFERENTIAL/PLATELET
Basophils Absolute: 0.1 10*3/uL (ref 0.0–0.2)
Basos: 2 %
EOS (ABSOLUTE): 0.2 10*3/uL (ref 0.0–0.4)
Eos: 3 %
Hematocrit: 40.5 % (ref 34.0–46.6)
Hemoglobin: 13.6 g/dL (ref 11.1–15.9)
Immature Grans (Abs): 0 10*3/uL (ref 0.0–0.1)
Immature Granulocytes: 0 %
Lymphocytes Absolute: 2.7 10*3/uL (ref 0.7–3.1)
Lymphs: 51 %
MCH: 32.4 pg (ref 26.6–33.0)
MCHC: 33.6 g/dL (ref 31.5–35.7)
MCV: 96 fL (ref 79–97)
Monocytes Absolute: 0.3 10*3/uL (ref 0.1–0.9)
Monocytes: 6 %
Neutrophils Absolute: 2 10*3/uL (ref 1.4–7.0)
Neutrophils: 38 %
Platelets: 254 10*3/uL (ref 150–450)
RBC: 4.2 x10E6/uL (ref 3.77–5.28)
RDW: 11.8 % (ref 11.7–15.4)
WBC: 5.3 10*3/uL (ref 3.4–10.8)

## 2021-12-13 LAB — HEMOGLOBIN A1C
Est. average glucose Bld gHb Est-mCnc: 146 mg/dL
Hgb A1c MFr Bld: 6.7 % — ABNORMAL HIGH (ref 4.8–5.6)

## 2021-12-13 LAB — LIPID PANEL
Chol/HDL Ratio: 3.5 ratio (ref 0.0–4.4)
Cholesterol, Total: 135 mg/dL (ref 100–199)
HDL: 39 mg/dL — ABNORMAL LOW (ref >39)
LDL Chol Calc (NIH): 77 mg/dL (ref 0–99)
Triglycerides: 105 mg/dL (ref 0–149)
VLDL Cholesterol Cal: 19 mg/dL (ref 5–40)

## 2021-12-13 LAB — CARDIOVASCULAR RISK ASSESSMENT

## 2021-12-13 NOTE — Progress Notes (Signed)
?  Chronic Care Management  ? ?Outreach Note ? ?12/13/2021 ?Name: Diane Chang MRN: [id number] DOB: [birth year] ? ?Referred by: Rip Harbour, NP ?Reason for referral : No chief complaint on file. ? ? ?An unsuccessful telephone outreach was attempted today. The patient was referred to the pharmacist for assistance with care management and care coordination.  ? ?Follow Up Plan:  ? ?Diane Chang ?Upstream Scheduler  ?

## 2021-12-13 NOTE — Progress Notes (Signed)
Glucose 118, kidney tests normal, liver tests normal, A1c 6.7 good, Cholesterol normal, CBC norma ?lp

## 2021-12-17 ENCOUNTER — Telehealth: Payer: Self-pay | Admitting: Nurse Practitioner

## 2021-12-17 NOTE — Chronic Care Management (AMB) (Signed)
?  Chronic Care Management  ? ?Outreach Note ? ?12/17/2021 ?Name: MELONIE GERMANI MRN: [id number] DOB: 10-Apr-1941 ? ?Referred by: Rip Harbour, NP ?Reason for referral : No chief complaint on file. ? ? ?A second unsuccessful telephone outreach was attempted today. The patient was referred to pharmacist for assistance with care management and care coordination. ? ?Follow Up Plan:  ? ?Tatjana Dellinger ?Upstream Scheduler  ?

## 2021-12-20 ENCOUNTER — Telehealth: Payer: Self-pay | Admitting: Nurse Practitioner

## 2021-12-20 NOTE — Chronic Care Management (AMB) (Signed)
?  Chronic Care Management  ? ?Outreach Note ? ?12/20/2021 ?Name: Diane Chang MRN: [id number] DOB: 1941/01/04 ? ?Referred by: Rip Harbour, NP ?Reason for referral : No chief complaint on file. ? ? ?Third unsuccessful telephone outreach was attempted today. The patient was referred to the pharmacist for assistance with care management and care coordination.  ? ?Follow Up Plan:  ? ?Tatjana Dellinger ?Upstream Scheduler  ?

## 2021-12-27 ENCOUNTER — Telehealth: Payer: Self-pay | Admitting: Nurse Practitioner

## 2021-12-27 NOTE — Progress Notes (Signed)
?  Chronic Care Management  ? ?Note ? ?12/27/2021 ?Name: Diane Chang MRN: [id number] DOB: [birth year] ? ?Diane Chang is a 81 y.o. year old female who is a primary care patient of Rip Harbour, NP. I reached out to Cletus Gash by phone today in response to a referral sent by Ms. Odella Aquas [place]'s PCP, Rip Harbour, NP.  ? ?Diane Chang was given information about Chronic Care Management services today including:  ?CCM service includes personalized support from designated clinical staff supervised by her physician, including individualized plan of care and coordination with other care providers ?24/7 contact phone numbers for assistance for urgent and routine care needs. ?Service will only be billed when office clinical staff spend 20 minutes or more in a month to coordinate care. ?Only one practitioner may furnish and bill the service in a calendar month. ?The patient may stop CCM services at any time (effective at the end of the month) by phone call to the office staff. ? ? ?Patient did not agree to enrollment in care management services and does not wish to consider at this time. ? ?Follow up plan: Pt state her medicines are doing very well. She states she works and is unable to take a call. ? ? ?Tatjana Dellinger ?Upstream Scheduler  ?

## 2022-02-12 DIAGNOSIS — J039 Acute tonsillitis, unspecified: Secondary | ICD-10-CM | POA: Insufficient documentation

## 2022-03-15 ENCOUNTER — Other Ambulatory Visit: Payer: Self-pay | Admitting: Nurse Practitioner

## 2022-03-15 ENCOUNTER — Ambulatory Visit: Payer: Medicare Other | Admitting: Nurse Practitioner

## 2022-04-05 DIAGNOSIS — K51311 Ulcerative (chronic) rectosigmoiditis with rectal bleeding: Secondary | ICD-10-CM | POA: Insufficient documentation

## 2022-04-12 ENCOUNTER — Ambulatory Visit: Payer: Medicare Other | Admitting: Nurse Practitioner

## 2022-04-16 ENCOUNTER — Encounter: Payer: Self-pay | Admitting: Nurse Practitioner

## 2022-04-16 ENCOUNTER — Ambulatory Visit (INDEPENDENT_AMBULATORY_CARE_PROVIDER_SITE_OTHER): Payer: Managed Care, Other (non HMO) | Admitting: Nurse Practitioner

## 2022-04-16 ENCOUNTER — Telehealth: Payer: Self-pay

## 2022-04-16 VITALS — BP 116/64 | HR 58 | Temp 97.6°F | Ht 65.0 in | Wt 182.0 lb

## 2022-04-16 DIAGNOSIS — Z780 Asymptomatic menopausal state: Secondary | ICD-10-CM

## 2022-04-16 DIAGNOSIS — M160 Bilateral primary osteoarthritis of hip: Secondary | ICD-10-CM

## 2022-04-16 DIAGNOSIS — I10 Essential (primary) hypertension: Secondary | ICD-10-CM | POA: Diagnosis not present

## 2022-04-16 DIAGNOSIS — I152 Hypertension secondary to endocrine disorders: Secondary | ICD-10-CM

## 2022-04-16 DIAGNOSIS — E1169 Type 2 diabetes mellitus with other specified complication: Secondary | ICD-10-CM

## 2022-04-16 DIAGNOSIS — E1159 Type 2 diabetes mellitus with other circulatory complications: Secondary | ICD-10-CM

## 2022-04-16 DIAGNOSIS — M8580 Other specified disorders of bone density and structure, unspecified site: Secondary | ICD-10-CM

## 2022-04-16 DIAGNOSIS — Z1231 Encounter for screening mammogram for malignant neoplasm of breast: Secondary | ICD-10-CM

## 2022-04-16 DIAGNOSIS — K529 Noninfective gastroenteritis and colitis, unspecified: Secondary | ICD-10-CM

## 2022-04-16 DIAGNOSIS — Z23 Encounter for immunization: Secondary | ICD-10-CM

## 2022-04-16 DIAGNOSIS — K219 Gastro-esophageal reflux disease without esophagitis: Secondary | ICD-10-CM | POA: Diagnosis not present

## 2022-04-16 DIAGNOSIS — E669 Obesity, unspecified: Secondary | ICD-10-CM

## 2022-04-16 DIAGNOSIS — E782 Mixed hyperlipidemia: Secondary | ICD-10-CM

## 2022-04-16 MED ORDER — OZEMPIC (0.25 OR 0.5 MG/DOSE) 2 MG/3ML ~~LOC~~ SOPN: 0.5000 mg | PEN_INJECTOR | SUBCUTANEOUS | 1 refills | Status: DC

## 2022-04-16 MED ORDER — TETANUS-DIPHTH-ACELL PERTUSSIS 5-2.5-18.5 LF-MCG/0.5 IM SUSP: 0.5000 mL | Freq: Once | INTRAMUSCULAR | 0 refills | Status: AC

## 2022-04-16 NOTE — Progress Notes (Signed)
Subjective:  Patient ID: Diane Chang, female    DOB: [birth year]  Age: 81 y.o. MRN: [id number]  CC: Type 2 DM HTN Hyperlipidemia  HPI  Pt presents for follow-up for T2DM, HTN, and hyperlipidemia. Pt states she is interested in chronic care management to assist with medication expenses. She is currently working full time due to financial constraints.   Pt was hospitalized for rectosigmoiditis in May 2023. She underwent EGD with esophageal dilation/Colonoscopy (showed ulceration). Gi recommended repeat colonoscopy in 02/2023 due to possible IBD. Currently using Rowasa 4 gm enema nightly.   Diabetes Mellitus Type II, follow-up  Lab Results  Component Value Date   HGBA1C 6.7 (H) 12/12/2021   HGBA1C 6.2 (H) 08/13/2021   HGBA1C 6.8 (H) 04/02/2021   Last seen for diabetes 3 months ago.  Management since then includes Metformin, Ozempic 0.5 mg weekly She reports excellent compliance with treatment. She is not having side effects.  Home blood sugar records: fasting range: 118-135 checks FBS daily Most Recent Eye Exam: Plans to schedule appt  --------------------------------------------------------------------------------------------------- Hypertension, follow-up  BP Readings from Last 3 Encounters:  12/12/21 120/70  08/13/21 135/80  08/02/21 118/70   Wt Readings from Last 3 Encounters:  04/16/22 182 lb (82.6 kg)  12/12/21 185 lb (83.9 kg)  10/23/21 190 lb (86.2 kg)      BP at that visit was 120/70. Management since that visit includes lisinopril hctz. She reports excellent compliance with treatment. She is not having side effects.  She is not exercising. She is not adherent to low salt diet.   She does not smoke.     Use of agents associated with hypertension: NSAIDS.   --------------------------------------------------------------------------------------------------- Lipid/Cholesterol, follow-up  Last Lipid Panel: Lab Results  Component Value Date   CHOL 135  12/12/2021   LDLCALC 77 12/12/2021   HDL 39 (L) 12/12/2021   TRIG 105 12/12/2021   Management since that visit includes lipitor. She reports excellent compliance with treatment. She is not having side effects.    Last metabolic panel Lab Results  Component Value Date   GLUCOSE 118 (H) 12/12/2021   NA 141 12/12/2021   K 4.7 12/12/2021   BUN 16 12/12/2021   CREATININE 0.81 12/12/2021   GFRNONAA 63 08/18/2020   GFRAA 72 08/18/2020   CALCIUM 9.9 12/12/2021   AST 26 12/12/2021   ALT 22 12/12/2021    GERD, Follow up:      Current treatment consist of: Prilosec      She reports excellent compliance with treatment. She is not having side effects. .     She is NOT experiencing abdominal bloating, belching, or cough   ---------------------------------------------------------------------------------------------------  Current Outpatient Medications on File Prior to Visit  Medication Sig Dispense Refill   alendronate (FOSAMAX) 70 MG tablet Take 1 tablet (70 mg total) by mouth once a week. Take with a full glass of water on an empty stomach. 12 tablet 2   aspirin EC 81 MG tablet Take 81 mg by mouth daily. Swallow whole.     atorvastatin (LIPITOR) 40 MG tablet Take 1 tablet (40 mg total) by mouth daily. 90 tablet 2   diphenoxylate-atropine (LOMOTIL) 2.5-0.025 MG tablet Take 1 tablet by mouth 4 (four) times daily as needed for diarrhea or loose stools. 30 tablet 0   Dulaglutide (TRULICITY) 1.5 YP/9.5KD SOPN Inject 1.5 mg into the skin once a week. 6 mL 2   lisinopril-hydrochlorothiazide (ZESTORETIC) 20-12.5 MG tablet Take 1 tablet by mouth daily. 90 tablet 2  meclizine (ANTIVERT) 25 MG tablet Take 1 tablet (25 mg total) by mouth 3 (three) times daily as needed. 30 tablet 2   mesalamine (ROWASA) 4 g enema SMARTSIG:60 Milliliter(s) Rectally Every Night     metFORMIN (GLUCOPHAGE) 500 MG tablet TAKE 1 TABLET BY MOUTH EVERY DAY WITH BREAKFAST 30 tablet 2   naproxen (NAPROSYN) 500 MG tablet  Take 1 tablet (500 mg total) by mouth 2 (two) times daily. 60 tablet 2   omeprazole (PRILOSEC) 40 MG capsule TAKE 1 CAPSULE BY MOUTH TWICE A DAY BEFORE A MEAL 60 capsule 6   No current facility-administered medications on file prior to visit.   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   Past Surgical History:  Procedure Laterality Date   CHOLECYSTECTOMY      Family History  Problem Relation Age of Onset   Heart attack Sister    Social History   Socioeconomic History   Marital status: Married    Spouse name: Not on file   Number of children: 1   Years of education: Not on file   Highest education level: Not on file  Occupational History   Occupation: packing  Tobacco Use   Smoking status: Former    Types: Cigarettes    Quit date: 1972    Years since quitting: 51.5   Smokeless tobacco: Never  Substance and Sexual Activity   Alcohol use: Never   Drug use: Never   Sexual activity: Not Currently  Other Topics Concern   Not on file  Social History Narrative   Not on file   Social Determinants of Health   Financial Resource Strain: Not on file  Food Insecurity: Not on file  Transportation Needs: Not on file  Physical Activity: Not on file  Stress: Not on file  Social Connections: Not on file    Review of Systems  Constitutional:  Negative for chills, fatigue and fever.  HENT:  Negative for congestion, ear pain,  rhinorrhea and sore throat.   Respiratory:  Negative for cough and shortness of breath.   Cardiovascular:  Negative for chest pain.  Gastrointestinal:  Negative for abdominal pain, constipation, diarrhea, nausea and vomiting.  Genitourinary:  Negative for dysuria and urgency.  Musculoskeletal:  Negative for back pain and myalgias.  Neurological:  Negative for dizziness, weakness, light-headedness and headaches.  Psychiatric/Behavioral:  Negative for dysphoric mood. The patient is not nervous/anxious.      Objective:  BP 116/64   Pulse (!) 58   Temp 97.6 F (36.4 C)   Ht '5\' 5"'$  (1.651 m)   Wt 182 lb (82.6 kg)   LMP  (LMP Unknown)   SpO2 99%   BMI 30.29 kg/m       04/16/2022    8:38 AM 12/12/2021    8:36 AM 10/23/2021    1:50 PM  BP/Weight  Systolic BP  725   Diastolic BP  70   Wt. (Lbs) 182 185 190  BMI 30.29 kg/m2 33.84 kg/m2 30.67 kg/m2  Physical Exam Vitals reviewed.  Constitutional:      Appearance: Normal appearance.  HENT:     Right Ear: Tympanic membrane normal.     Left Ear: Tympanic membrane normal.     Nose: Nose normal.     Mouth/Throat:     Mouth: Mucous membranes are moist.     Dentition: Abnormal dentition (missing teeth, poor dentition).  Cardiovascular:     Rate and Rhythm: Normal rate and regular rhythm.     Pulses: Normal pulses.     Heart sounds: Normal heart sounds.  Pulmonary:     Effort: Pulmonary effort is normal.     Breath sounds: Normal breath sounds.  Abdominal:     General: Bowel sounds are normal.     Palpations: Abdomen is soft.  Skin:    General: Skin is warm and dry.     Capillary Refill: Capillary refill takes less than 2 seconds.  Neurological:     General: No focal deficit present.     Mental Status: She is alert and oriented to person, place, and time.  Psychiatric:        Mood and Affect: Mood normal.        Behavior: Behavior normal.       Lab Results  Component Value Date   WBC 5.3 12/12/2021   HGB 13.6  12/12/2021   HCT 40.5 12/12/2021   PLT 254 12/12/2021   GLUCOSE 118 (H) 12/12/2021   CHOL 135 12/12/2021   TRIG 105 12/12/2021   HDL 39 (L) 12/12/2021   LDLCALC 77 12/12/2021   ALT 22 12/12/2021   AST 26 12/12/2021   NA 141 12/12/2021   K 4.7 12/12/2021   CL 103 12/12/2021   CREATININE 0.81 12/12/2021   BUN 16 12/12/2021   CO2 26 12/12/2021   TSH 1.660 08/13/2021   HGBA1C 6.7 (H) 12/12/2021   MICROALBUR 30 12/15/2020      Assessment & Plan:   1. DM type 2 with diabetic mixed hyperlipidemia (HCC)-well controlled - CBC with Differential/Platelet - Hemoglobin A1c - Lipid panel - Microalbumin / creatinine urine ratio - AMB Referral to [facility] - Semaglutide,0.25 or 0.'5MG'$ /DOS, (OZEMPIC, 0.25 OR 0.5 MG/DOSE,) 2 MG/3ML SOPN; Inject 0.5 mg into the skin once a week.  Dispense: 9 mL; Refill: 1  2. Obesity, diabetes, and hypertension syndrome (HCC)-well controlled - AMB Referral to [facility] - Semaglutide,0.25 or 0.'5MG'$ /DOS, (OZEMPIC, 0.25 OR 0.5 MG/DOSE,) 2 MG/3ML SOPN; Inject 0.5 mg into the skin once a week.  Dispense: 9 mL; Refill: 1  3. Gastroesophageal reflux disease, unspecified whether esophagitis present-well controlled - AMB Referral to [facility] -continue Omeprazole 40 mg QD  4. Benign hypertension-well controlled - Comprehensive metabolic panel - AMB Referral to [facility]  5. Primary osteoarthritis of both hips-well controlled - AMB Referral to [facility] -Continue Naprosyn as directed   6. Screening mammogram for breast cancer - MM DIGITAL SCREENING BILATERAL; Future  7. Need for vaccination - Pneumococcal polysaccharide vaccine 23-valent greater than or equal to 2yo subcutaneous/IM - Tdap (BOOSTRIX) 5-2.5-18.5 LF-MCG/0.5 injection; Inject 0.5 mLs into the muscle once for 1 dose.  Dispense: 0.5 mL; Refill: 0  8. Osteopenia after menopause - DG Bone Density; Future       We will call you with lab results, mammogram, and DEXA (bone density) scan Continue medications Chronic care management will contact you by phone Follow-up in 28-month  Follow-up: 336-month An After Visit Summary was  printed and given to the patient.  I, Rip Harbour, NP, have reviewed all documentation for this visit. The documentation on 04/16/22 for the exam, diagnosis, procedures, and orders are all accurate and complete.   Signed, Rip Harbour, NP [place] [phone]

## 2022-04-16 NOTE — Patient Instructions (Addendum)
We will call you with lab results, mammogram, and DEXA (bone density) scan Continue medications Pneumonia vaccine given in office today Chronic care management will contact you by phone Obtain Tdap (tetanus) booster at the pharmacy Follow-up in 34-months   Pneumococcal Conjugate Vaccine: What You Need to Know 1. Why get vaccinated? Pneumococcal conjugate vaccine can prevent pneumococcal disease. Pneumococcal disease refers to any illness caused by pneumococcal bacteria. These bacteria can cause many types of illnesses, including pneumonia, which is an infection of the lungs. Pneumococcal bacteria are one of the most common causes of pneumonia. Besides pneumonia, pneumococcal bacteria can also cause: Ear infections Sinus infections Meningitis (infection of the tissue covering the brain and spinal cord) Bacteremia (infection of the blood) Anyone can get pneumococcal disease, but children under 23 years old, people with certain medical conditions or other risk factors, and adults 79 years or older are at the highest risk. Most pneumococcal infections are mild. However, some can result in long-term problems, such as brain damage or hearing loss. Meningitis, bacteremia, and pneumonia caused by pneumococcal disease can be fatal. 2. Pneumococcal conjugate vaccine Pneumococcal conjugate vaccine helps protect against bacteria that cause pneumococcal disease. There are three pneumococcal conjugate vaccines (PCV13, PCV15, and PCV20). The different vaccines are recommended for different people based on their age and medical status. PCV13 Infants and young children usually need 4 doses of PCV13, at ages 40, 5, 42, and 12-15 months. Older children (through age 28 months) may be vaccinated with PCV13 if they did not receive the recommended doses. Children and adolescents 27-41 years of age with certain medical conditions should receive a single dose of PCV13 if they did not already receive PCV13. PCV15 or  PCV20 Adults 44 through 79 years old with certain medical conditions or other risk factors who have not already received a pneumococcal conjugate vaccine should receive either: a single dose of PCV15 followed by a dose of pneumococcal polysaccharide vaccine (PPSV23), or a single dose of PCV20. Adults 19 years or older who have not already received a pneumococcal conjugate vaccine should receive either: a single dose of PCV15 followed by a dose of PPSV23, or a single dose of PCV20. Your health care provider can give you more information. 3. Talk with your health care provider Tell your vaccination provider if the person getting the vaccine: Has had an allergic reaction after a previous dose of any type of pneumococcal conjugate vaccine (PCV13, PCV15, PCV20, or an earlier pneumococcal conjugate vaccine known as PCV7), or to any vaccine containing diphtheria toxoid (for example, DTaP), or has any severe, life-threatening allergies In some cases, your health care provider may decide to postpone pneumococcal conjugate vaccination until a future visit. People with minor illnesses, such as a cold, may be vaccinated. People who are moderately or severely ill should usually wait until they recover. Your health care provider can give you more information. 4. Risks of a vaccine reaction Redness, swelling, pain, or tenderness where the shot is given, and fever, loss of appetite, fussiness (irritability), feeling tired, headache, muscle aches, joint pain, and chills can happen after pneumococcal conjugate vaccination. Young children may be at increased risk for seizures caused by fever after PCV13 if it is administered at the same time as inactivated influenza vaccine. Ask your health care provider for more information. People sometimes faint after medical procedures, including vaccination. Tell your provider if you feel dizzy or have vision changes or ringing in the ears. As with any medicine, there is a very  remote  chance of a vaccine causing a severe allergic reaction, other serious injury, or death. 5. What if there is a serious problem? An allergic reaction could occur after the vaccinated person leaves the clinic. If you see signs of a severe allergic reaction (hives, swelling of the face and throat, difficulty breathing, a fast heartbeat, dizziness, or weakness), call 9-1-1 and get the person to the nearest hospital. For other signs that concern you, call your health care provider. Adverse reactions should be reported to the Vaccine Adverse Event Reporting System (VAERS). Your health care provider will usually file this report, or you can do it yourself. Visit the VAERS website at www.vaers.SamedayNews.es or call 606-825-6050. VAERS is only for reporting reactions, and VAERS staff members do not give medical advice. 6. The National Vaccine Injury Compensation Program The [employer] Vaccine Injury Compensation Program (VICP) is a federal program that was created to compensate people who may have been injured by certain vaccines. Claims regarding alleged injury or death due to vaccination have a time limit for filing, which may be as short as two years. Visit the VICP website at GoldCloset.com.ee or call (712)186-1790 to learn about the program and about filing a claim. 7. How can I learn more? Ask your health care provider. Call your local or state health department. Visit the website of the Food and Drug Administration (FDA) for vaccine package inserts and additional information at TraderRating.uy. Contact the Centers for Disease Control and Prevention (CDC): Call 406-077-2509 (1-800-CDC-INFO) or Visit CDC's website at http://hunter.com/. Source: CDC Vaccine Information Statement (Interim) Pneumococcal Conjugate Vaccine (11/10/2020) This same material is available at http://www.wolf.info/ for no charge. This information is not intended to replace advice given to you  by your health care provider. Make sure you discuss any questions you have with your health care provider. Document Revised: 08/22/2021 Document Reviewed: 11/24/2020 Elsevier Patient Education  [place] [facility] Adults Your bones do more than support your body. They also store calcium. The inside of your bones (marrow) makes blood cells. Maintaining bone health becomes more important as you age because your bones replace all their cells about every 10 years. Around age 28, it gets harder to replace those cells, and bones can become weak. Weak bones can lead to osteoporosis and breaks (fractures). Falls also become more likely, which can cause fractures. The good news is that, with diet and exercise, you can improve and maintain your bone health at any age. How to eat for bone health A balanced diet can supply many of the vitamins, minerals, and proteins you need for bone health. Older adults need to make sure they get enough calcium, vitamin D, and magnesium. You may need more of these as you age. Calcium  Calcium is the most important (essential) mineral for bone health. The daily requirement for calcium for adult men aged 60-70 years is 1,000 mg. For adult women aged 44-70 years, it is 1,200 mg. At age 55 or older, it is 1,200 mg for both men and women. Sources of calcium in your diet include: Dairy foods like milk, yogurt, and cheese. Dairy foods are the best sources. If you cannot eat dairy, you may need a calcium supplement. Leafy, dark green vegetables. These include collard greens, kale, broccoli, bok choy, and okra. Fatty fish, like sardines and canned salmon with bones. Almonds. Tofu.  Vitamin D You need vitamin D for bone health because it helps your body absorb calcium from your diet. It is not found  naturally in many foods. Many people can benefit from taking a supplement. The daily requirement for vitamin D at age 59 or older is 600-1,000  international units (IU). Dietary sources for vitamin D include: Fatty fish, such as swordfish, salmon, sardine, and mackerel. Foods that have vitamin D added to them (are fortified), like cereal and dairy products. Egg yolks. Magnesium Magnesium helps your body use both calcium and vitamin D. The recommended daily intake for adult men is 400-420 mg. For adult women, it is 310-320 mg. Dietary sources of magnesium include: Green vegetables, such as collard greens, kale, bok choy, and okra. Poppy, sesame, and chia seeds. Legumes, including peas and beans. Whole grains. Avocados. Nuts. If you drink alcohol regularly or take a type of antacid called a proton pump inhibitor, you might benefit from a magnesium supplement. How to exercise for bone health Exercise is important for bone health because it strengthens bones and muscles. Bones are living organs that get stronger when you exercise them, just like your heart and other muscles. Muscles support your bones and protect your joints. Strong muscles also help prevent bone loss and falls. Weight-bearing exercises and resistance exercises are the two types of exercise that are most important for bone health. Weight-bearing exercises include running or walking, climbing stairs, and playing sports like tennis. Resistance exercises include those done using free weights, weight machines, or resistance bands. Try to get at least 30 minutes of exercise every day. You can also include stretching, balance, and flexibility exercises, like yoga or tai chi. These lower your risk of falls. Follow these instructions at home Ask your health care provider if: You need a bone density test. This is especially important for women older than 31 and men older than 70. You have been losing height. Loss of height may be a sign of weakening bones in your spine. Any of your medicines or medical conditions could affect your bone health. He or she could recommend an  exercise program that is safe for you. Be sure it includes both weight-bearing and resistance exercises. Do not use any products that contain nicotine or tobacco. These products include cigarettes, chewing tobacco, and vaping devices, such as e-cigarettes. These can reduce bone density. If you need help quitting, ask your health care provider. Drink alcohol in moderation. Alcohol reduces bone density and increases your risk for falls. If you drink alcohol: Limit how much you have to: 0-1 drink a day for women who are not pregnant. 0-2 drinks a day for men. Know how much alcohol is in a drink. In the [place] one drink equals one 12 oz bottle of beer (355 mL), one 5 oz glass of wine (148 mL), or one 1 oz glass of hard liquor (44 mL). Take over-the-counter and prescription medicines only as told by your health care provider. Ask your health care provider if you could benefit from taking calcium, vitamin D, or magnesium supplements. Keep all follow-up visits. This is important. Where to find more information American Bone Health: CardKnowledge.fi American Academy of Orthopaedic Surgeons: orthoinfo.[place]: bones.SouthExposed.es Summary Maintaining your bone health becomes more important as you age. You can improve and maintain your bone health with diet and exercise at any age. A balanced diet can supply many of the vitamins, minerals, and proteins you need for bone health. Older adults need to make sure they get enough calcium, vitamin D, and magnesium. Ask your health care provider if you could benefit from taking calcium, vitamin D, or  magnesium supplements. Exercise is important for bone health because it strengthens bones and muscles. Do both weight-bearing and resistance exercises. This information is not intended to replace advice given to you by your health care provider. Make sure you discuss any questions you have with your health care provider. Document  Revised: 03/07/2021 Document Reviewed: 03/07/2021 Elsevier Patient Education  [place].   Diabetes Mellitus and Skin Care Diabetes, also called diabetes mellitus, can lead to skin problems. People with diabetes have a higher risk for many types of skin complications because poorly controlled blood sugar (glucose) levels can cause problems over time. These problems include: Damage to nerves. This can affect your ability to feel wounds, which means you may not notice minor skin injuries that could lead to serious problems. This can also decrease the amount that you sweat, causing dry skin that can break down. Damage to blood vessels. The lack of blood flow can cause skin to break down and can slow healing time, which can lead to infections. Areas of skin that become thick or discolored. Common skin conditions There are certain skin conditions that commonly affect people who have diabetes, such as: Dry skin. Thin skin. The skin on the feet may get thinner, break more easily, and heal more slowly than normal. Bacterial skin infections, including: Styes. Boils. Infected hair follicles. Infections of the skin around the nails. Fungal skin infections. These are most common in areas where skin rubs together, such as in the armpits or under the breasts. Common skin changes Diabetes can also cause the skin to change. You may develop: Dark, velvety markings on the skin that usually appear on the face, neck, armpits, inner thighs, and groin. Red, raised, scar-like tissue that may itch, feel painful, or develop into a wound. Blisters on the feet, toes, hands, or fingers. Thickened, wax-like areas of skin that usually occur on the hands, forehead, or toes. Brown or red, ring-shaped or half-ring-shaped patches of skin on the ears or fingers. Pea-shaped, yellow bumps that may be itchy and surrounded by a red ring. This usually affects the arms, feet, buttocks, and the top of the hands. Round,  discolored patches of tan skin that do not hurt or itch. These may look like age spots. General tips Most skin problems can be prevented or treated easily if caught early. Talk with your health care provider if you have any concerns. General tips usually include: Check your skin every day for cuts, bruises, redness, blisters, or sores, especially on your feet. Tell your health care provider about any cuts, wounds, or sores that you have and if they are healing slowly. Keep your skin clean and dry. Do not use hot water. Moisturize your skin to prevent chapping. Do not scratch dry skin. Scratching dry skin can expose it to infection. Keep your blood glucose levels within target range. Supplies needed: Mild soap or gentle skin cleanser. Lotion. How to care for dry itchy skin It is common for people with diabetes to have itchy skin caused by dryness. Frequent high glucose levels can cause itchiness, and poor circulation and certain skin infections can make dry, itchy skin worse. If you have itchy skin that is red or covered in a rash, this could be a sign of an allergic reaction. If you have a rash or if your skin is very itchy, contact your health care provider. You may need help to manage your diabetes better, or you may need treatment for an infection. Untreated fungal infections can be  dangerous because they allow more serious bacterial infections to enter the body. To relieve dry skin and itching: Avoid very hot showers and baths. Use mild soap and gentle skin cleansers. Do not use soap that is perfumed or harsh or that dries your skin. Moisturizing soaps may help. Put on moisturizing lotion as soon as you finish bathing. Follow these instructions at home:  Schedule a foot exam with your health care provider once every year. This exam includes an inspection of the structure and skin of your feet. Make sure that your health care provider performs a visual foot exam at every medical visit. If  you get a skin injury, such as a cut, blister, or sore, check the area every day for signs of infection. Check for: Redness, swelling, or pain. Fluid or blood. Warmth. Pus or a bad smell. Do not use any products that contain nicotine or tobacco. These products include cigarettes, chewing tobacco, and vaping devices, such as e-cigarettes. If you need help quitting, ask your health care provider. Take over-the-counter and prescription medicines only as told by your health care provider. This includes all diabetes medicines you are taking. Where to find more information American Diabetes Association: www.diabetes.org Association of Diabetes Care & Education Specialists: www.diabeteseducator.org Contact a health care provider if you: Develop a cut or sore, especially on your feet. Develop signs of infection after a skin injury. Have itchy skin that develops redness or a rash. Have discolored areas of skin. Have areas where your skin is changing, such as thickening or appearing shiny. Summary People with diabetes have a higher risk for many types of skin complications. This is because poorly controlled blood sugar (glucose) levels can cause skin problems over time. Most skin problems can be prevented or treated easily if caught early. Keep your blood glucose levels within target range. Check your skin every day for cuts, bruises, redness, blisters, or sores, especially on your feet. Tell your health care provider about any cuts, wounds, or sores that you have and if they are healing slowly. This information is not intended to replace advice given to you by your health care provider. Make sure you discuss any questions you have with your health care provider. Document Revised: 04/13/2020 Document Reviewed: 04/13/2020 Elsevier Patient Education  [place].

## 2022-04-16 NOTE — Telephone Encounter (Signed)
PA approved via covermymeds for Ozempic.

## 2022-04-17 LAB — COMPREHENSIVE METABOLIC PANEL
ALT: 27 IU/L (ref 0–32)
AST: 27 IU/L (ref 0–40)
Albumin/Globulin Ratio: 1.6 (ref 1.2–2.2)
Albumin: 4.2 g/dL (ref 3.8–4.8)
Alkaline Phosphatase: 53 IU/L (ref 44–121)
BUN/Creatinine Ratio: 13 (ref 12–28)
BUN: 11 mg/dL (ref 8–27)
Bilirubin Total: 0.4 mg/dL (ref 0.0–1.2)
CO2: 26 mmol/L (ref 20–29)
Calcium: 9.7 mg/dL (ref 8.7–10.3)
Chloride: 105 mmol/L (ref 96–106)
Creatinine, Ser: 0.82 mg/dL (ref 0.57–1.00)
Globulin, Total: 2.7 g/dL (ref 1.5–4.5)
Glucose: 124 mg/dL — ABNORMAL HIGH (ref 70–99)
Potassium: 4.1 mmol/L (ref 3.5–5.2)
Sodium: 143 mmol/L (ref 134–144)
Total Protein: 6.9 g/dL (ref 6.0–8.5)
eGFR: 73 mL/min/{1.73_m2} (ref >59)

## 2022-04-17 LAB — LIPID PANEL
Chol/HDL Ratio: 3.8 ratio (ref 0.0–4.4)
Cholesterol, Total: 175 mg/dL (ref 100–199)
HDL: 46 mg/dL (ref >39)
LDL Chol Calc (NIH): 106 mg/dL — ABNORMAL HIGH (ref 0–99)
Triglycerides: 127 mg/dL (ref 0–149)
VLDL Cholesterol Cal: 23 mg/dL (ref 5–40)

## 2022-04-17 LAB — CBC WITH DIFFERENTIAL/PLATELET
Basophils Absolute: 0.1 10*3/uL (ref 0.0–0.2)
Basos: 1 %
EOS (ABSOLUTE): 0.2 10*3/uL (ref 0.0–0.4)
Eos: 3 %
Hematocrit: 39.9 % (ref 34.0–46.6)
Hemoglobin: 13.6 g/dL (ref 11.1–15.9)
Immature Grans (Abs): 0 10*3/uL (ref 0.0–0.1)
Immature Granulocytes: 0 %
Lymphocytes Absolute: 2.5 10*3/uL (ref 0.7–3.1)
Lymphs: 42 %
MCH: 32.9 pg (ref 26.6–33.0)
MCHC: 34.1 g/dL (ref 31.5–35.7)
MCV: 97 fL (ref 79–97)
Monocytes Absolute: 0.4 10*3/uL (ref 0.1–0.9)
Monocytes: 6 %
Neutrophils Absolute: 2.9 10*3/uL (ref 1.4–7.0)
Neutrophils: 48 %
Platelets: 224 10*3/uL (ref 150–450)
RBC: 4.13 x10E6/uL (ref 3.77–5.28)
RDW: 12.2 % (ref 11.7–15.4)
WBC: 5.9 10*3/uL (ref 3.4–10.8)

## 2022-04-17 LAB — MICROALBUMIN / CREATININE URINE RATIO
Creatinine, Urine: 166 mg/dL
Microalb/Creat Ratio: 5 mg/g creat (ref 0–29)
Microalbumin, Urine: 8.2 ug/mL

## 2022-04-17 LAB — HEMOGLOBIN A1C
Est. average glucose Bld gHb Est-mCnc: 140 mg/dL
Hgb A1c MFr Bld: 6.5 % — ABNORMAL HIGH (ref 4.8–5.6)

## 2022-04-17 LAB — CARDIOVASCULAR RISK ASSESSMENT

## 2022-04-23 ENCOUNTER — Encounter: Payer: Self-pay | Admitting: Nurse Practitioner

## 2022-05-06 ENCOUNTER — Other Ambulatory Visit: Payer: Self-pay | Admitting: Legal Medicine

## 2022-05-06 DIAGNOSIS — E1169 Type 2 diabetes mellitus with other specified complication: Secondary | ICD-10-CM

## 2022-05-20 ENCOUNTER — Telehealth: Payer: Self-pay | Admitting: Nurse Practitioner

## 2022-05-20 NOTE — Progress Notes (Signed)
  Chronic Care Management   Outreach Note  05/20/2022 Name: Diane Chang MRN: [id number] DOB: May 02, 1941  Referred by: Rip Harbour, NP Reason for referral : No chief complaint on file.   An unsuccessful telephone outreach was attempted today. The patient was referred to the pharmacist for assistance with care management and care coordination.   Follow Up Plan: called after 4pm nvm  Tatjana Dellinger [employer] Scheduler

## 2022-05-28 ENCOUNTER — Telehealth: Payer: Self-pay | Admitting: Nurse Practitioner

## 2022-05-28 NOTE — Progress Notes (Signed)
  Chronic Care Management   Outreach Note  05/28/2022 Name: ALEKSA COLLINSWORTH MRN: [id number] DOB: Feb 10, 1941  Referred by: Rip Harbour, NP Reason for referral : No chief complaint on file.   A second unsuccessful telephone outreach was attempted today. The patient was referred to pharmacist for assistance with care management and care coordination.  Follow Up Plan: pt requests a call mid Septemeber she is getting new insurance  Psychologist, [facility]

## 2022-05-29 ENCOUNTER — Other Ambulatory Visit: Payer: Self-pay

## 2022-05-29 DIAGNOSIS — E1159 Type 2 diabetes mellitus with other circulatory complications: Secondary | ICD-10-CM

## 2022-05-29 DIAGNOSIS — H8113 Benign paroxysmal vertigo, bilateral: Secondary | ICD-10-CM

## 2022-05-29 DIAGNOSIS — E1169 Type 2 diabetes mellitus with other specified complication: Secondary | ICD-10-CM

## 2022-05-29 DIAGNOSIS — E782 Mixed hyperlipidemia: Secondary | ICD-10-CM

## 2022-05-29 DIAGNOSIS — M810 Age-related osteoporosis without current pathological fracture: Secondary | ICD-10-CM

## 2022-05-29 DIAGNOSIS — I10 Essential (primary) hypertension: Secondary | ICD-10-CM

## 2022-05-29 DIAGNOSIS — I152 Hypertension secondary to endocrine disorders: Secondary | ICD-10-CM

## 2022-05-29 DIAGNOSIS — M160 Bilateral primary osteoarthritis of hip: Secondary | ICD-10-CM

## 2022-05-29 MED ORDER — MECLIZINE HCL 25 MG PO TABS: 25.0000 mg | ORAL_TABLET | Freq: Three times a day (TID) | ORAL | 2 refills | Status: DC | PRN

## 2022-05-29 MED ORDER — METFORMIN HCL 500 MG PO TABS: ORAL_TABLET | ORAL | 2 refills | Status: DC

## 2022-05-29 MED ORDER — OZEMPIC (0.25 OR 0.5 MG/DOSE) 2 MG/3ML ~~LOC~~ SOPN: 0.5000 mg | PEN_INJECTOR | SUBCUTANEOUS | 1 refills | Status: DC

## 2022-05-29 MED ORDER — NAPROXEN 500 MG PO TABS: 500.0000 mg | ORAL_TABLET | Freq: Two times a day (BID) | ORAL | 2 refills | Status: DC

## 2022-05-29 MED ORDER — ATORVASTATIN CALCIUM 40 MG PO TABS: 40.0000 mg | ORAL_TABLET | Freq: Every day | ORAL | 2 refills | Status: DC

## 2022-05-29 MED ORDER — ALENDRONATE SODIUM 70 MG PO TABS: 70.0000 mg | ORAL_TABLET | ORAL | 2 refills | Status: DC

## 2022-05-29 MED ORDER — LISINOPRIL-HYDROCHLOROTHIAZIDE 20-12.5 MG PO TABS: 1.0000 | ORAL_TABLET | Freq: Every day | ORAL | 2 refills | Status: DC

## 2022-06-03 ENCOUNTER — Telehealth: Payer: Self-pay

## 2022-06-03 NOTE — Telephone Encounter (Signed)
Left a message for patient to call the office back regarding her [employer] insurance. The insurance will not be effective until 06/07/2022.

## 2022-06-04 ENCOUNTER — Encounter: Payer: Self-pay | Admitting: Physician Assistant

## 2022-06-04 ENCOUNTER — Ambulatory Visit (INDEPENDENT_AMBULATORY_CARE_PROVIDER_SITE_OTHER): Payer: Managed Care, Other (non HMO) | Admitting: Physician Assistant

## 2022-06-04 VITALS — BP 118/74 | HR 87 | Temp 97.9°F | Ht 65.0 in | Wt 198.6 lb

## 2022-06-04 DIAGNOSIS — G8929 Other chronic pain: Secondary | ICD-10-CM | POA: Insufficient documentation

## 2022-06-04 DIAGNOSIS — M25511 Pain in right shoulder: Secondary | ICD-10-CM

## 2022-06-04 MED ORDER — PREDNISONE 20 MG PO TABS: ORAL_TABLET | ORAL | 0 refills | Status: AC

## 2022-06-04 NOTE — Progress Notes (Signed)
Acute Office Visit  Subjective:    Patient ID: Diane Chang, female    DOB: 1941/04/08, 81 y.o.   MRN: [id number]  Chief Complaint  Patient presents with   Arm Pain    HPI: Patient is in today for complaints of right shoulder pain - she states that she has had pain and soreness in her right arm and shoulder for about a month.  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 Complains of pain reaching up and trying to reach above her head She is using naprosyn as directed which is helpful at times   Past Medical History:  Diagnosis Date   Age-related osteoporosis without current pathological fracture    Benign hypertension 12/17/2019   Benign paroxysmal positional vertigo 05/01/2020   BMI 32.0-32.9,adult 04/17/2020   BMI 35.0-35.9,adult 04/17/2020   Cardiac murmur 08/25/2020   DM type 2 with diabetic mixed hyperlipidemia (HCC)    Fatigue 08/13/2021   GERD (gastroesophageal reflux disease) 12/17/2019   Mixed hyperlipidemia    Obesity, diabetes, and hypertension syndrome ([place]) 12/17/2019   Osteoarthritis of hip 12/17/2019   Osteoporosis 12/17/2019   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    Past Surgical History:  Procedure Laterality Date   CHOLECYSTECTOMY      Family History  Problem Relation Age of Onset   Heart attack Sister     Social History   Socioeconomic History   Marital status: Married    Spouse name: Not on file   Number of children: 1   Years of education: Not on file   Highest education level: Not on file  Occupational History   Occupation: packing  Tobacco Use   Smoking status: Former    Types: Cigarettes    Quit date: 1972    Years since quitting: 51.6   Smokeless tobacco: Never  Substance and Sexual Activity   Alcohol use: Never   Drug use: Never   Sexual activity: Not Currently  Other Topics Concern   Not on file  Social History  Narrative   Not on file   Social Determinants of Health   Financial Resource Strain: Low Risk  (04/16/2022)   Overall Financial Resource Strain (CARDIA)    Difficulty of Paying Living Expenses: Not hard at all  Food Insecurity: No Food Insecurity (04/16/2022)   Hunger Vital Sign    Worried About Running Out of Food in the Last Year: Never true    Ran Out of Food in the Last Year: Never true  Transportation Needs: Unknown (04/16/2022)   PRAPARE - Hydrologist (Medical): Not on file    Lack of Transportation (Non-Medical): No  Physical Activity: Inactive (04/16/2022)   Exercise Vital Sign    Days of Exercise per Week: 0 days    Minutes of Exercise per Session: 0 min  Stress: No Stress Concern Present (04/16/2022)   [place]    Feeling of Stress : Not at all  Social Connections: Socially Isolated (04/16/2022)   Social Connection and Isolation Panel [NHANES]    Frequency of Communication with Friends and Family: More than three times a week    Frequency of Social Gatherings with Friends and Family: More than three times a week    Attends Religious Services: Never    Marine scientist or Organizations: No    Attends  Club or Organization Meetings: Never    Marital Status: Divorced  Human resources officer Violence: Not At Risk (04/16/2022)   Humiliation, Afraid, Rape, and Kick questionnaire    Fear of Current or Ex-Partner: No    Emotionally Abused: No    Physically Abused: No    Sexually Abused: No    Outpatient Medications Prior to Visit  Medication Sig Dispense Refill   alendronate (FOSAMAX) 70 MG tablet Take 1 tablet (70 mg total) by mouth once a week. Take with a full glass of water on an empty stomach. 12 tablet 2   aspirin EC 81 MG tablet Take 81 mg by mouth daily. Swallow whole.     atorvastatin (LIPITOR) 40 MG tablet Take 1 tablet (40 mg total) by mouth daily. 90 tablet 2    diphenoxylate-atropine (LOMOTIL) 2.5-0.025 MG tablet Take 1 tablet by mouth 4 (four) times daily as needed for diarrhea or loose stools. 30 tablet 0   lisinopril-hydrochlorothiazide (ZESTORETIC) 20-12.5 MG tablet Take 1 tablet by mouth daily. 90 tablet 2   meclizine (ANTIVERT) 25 MG tablet Take 1 tablet (25 mg total) by mouth 3 (three) times daily as needed. 30 tablet 2   mesalamine (ROWASA) 4 g enema SMARTSIG:60 Milliliter(s) Rectally Every Night     metFORMIN (GLUCOPHAGE) 500 MG tablet TAKE 1 TABLET BY MOUTH EVERY DAY WITH BREAKFAST 30 tablet 2   naproxen (NAPROSYN) 500 MG tablet Take 1 tablet (500 mg total) by mouth 2 (two) times daily. 60 tablet 2   omeprazole (PRILOSEC) 40 MG capsule TAKE 1 CAPSULE BY MOUTH TWICE A DAY BEFORE A MEAL 60 capsule 6   Semaglutide,0.25 or 0.5MG /DOS, (OZEMPIC, 0.25 OR 0.5 MG/DOSE,) 2 MG/3ML SOPN Inject 0.5 mg into the skin once a week. 9 mL 1   No facility-administered medications prior to visit.    No Known Allergies  Review of Systems  CARDIOVASCULAR: Negative for chest pain,  RESPIRATORY: Negative for recent cough and dyspnea.   MSKsee HPI INTEGUMENTARY: Negative for rash.          Objective:  PHYSICAL EXAM:   VS: BP 118/74 (BP Location: Left Arm, Patient Position: Sitting, Cuff Size: Large)   Pulse 87   Temp 97.9 F (36.6 C) (Temporal)   Ht 5\' 5"  (1.651 m)   Wt 198 lb 9.6 oz (90.1 kg)   LMP  (LMP Unknown)   SpO2 98%   BMI 33.05 kg/m   GEN: Well nourished, well developed, in no acute distress   Cardiac: RRR; no murmurs, Respiratory:  normal respiratory rate and pattern with no distress - normal breath sounds with no rales, rhonchi, wheezes or rubs  MS: right shoulder girdle tender to palapation - decreased rom with extension and abduction of right shoulder Skin: warm and dry, no rash      Health Maintenance Due  Topic Date Due   TETANUS/TDAP  Never done   Zoster Vaccines- Shingrix (1 of 2) Never done   MAMMOGRAM  04/15/2021    OPHTHALMOLOGY EXAM  04/12/2022   INFLUENZA VACCINE  05/07/2022    There are no preventive care reminders to display for this patient.   Lab Results  Component Value Date   TSH 1.660 08/13/2021   Lab Results  Component Value Date   WBC 5.9 04/16/2022   HGB 13.6 04/16/2022   HCT 39.9 04/16/2022   MCV 97 04/16/2022   PLT 224 04/16/2022   Lab Results  Component Value Date   NA 143 04/16/2022   K 4.1 04/16/2022  CO2 26 04/16/2022   GLUCOSE 124 (H) 04/16/2022   BUN 11 04/16/2022   CREATININE 0.82 04/16/2022   BILITOT 0.4 04/16/2022   ALKPHOS 53 04/16/2022   AST 27 04/16/2022   ALT 27 04/16/2022   PROT 6.9 04/16/2022   ALBUMIN 4.2 04/16/2022   CALCIUM 9.7 04/16/2022   EGFR 73 04/16/2022   Lab Results  Component Value Date   CHOL 175 04/16/2022   Lab Results  Component Value Date   HDL 46 04/16/2022   Lab Results  Component Value Date   LDLCALC 106 (H) 04/16/2022   Lab Results  Component Value Date   TRIG 127 04/16/2022   Lab Results  Component Value Date   CHOLHDL 3.8 04/16/2022   Lab Results  Component Value Date   HGBA1C 6.5 (H) 04/16/2022       Assessment & Plan:   Problem List Items Addressed This Visit       Other   Chronic right shoulder pain - Primary   Relevant Medications   predniSONE (DELTASONE) 20 MG tablet Continue naprosyn as directed Rom exercises given Recommend ice therapy If symptoms persist recommend ortho referral   Meds ordered this encounter  Medications   predniSONE (DELTASONE) 20 MG tablet    Sig: Take 3 tablets (60 mg total) by mouth daily with breakfast for 3 days, THEN 2 tablets (40 mg total) daily with breakfast for 3 days, THEN 1 tablet (20 mg total) daily with breakfast for 3 days.    Dispense:  18 tablet    Refill:  0    Order Specific Question:   Supervising Provider    Answer:   Shelton Silvas    No orders of the defined types were placed in this encounter.    Follow-up: Return if symptoms worsen  or fail to improve.  An After Visit Summary was printed and given to the patient.  Yetta Flock [facility] [phone]

## 2022-06-21 ENCOUNTER — Telehealth: Payer: Self-pay | Admitting: Nurse Practitioner

## 2022-06-21 NOTE — Progress Notes (Signed)
  Chronic Care Management   Note  06/21/2022 Name: Diane Chang MRN: [id number] DOB: 11-03-1940  Diane Chang is a 81 y.o. year old female who is a primary care patient of Rip Harbour, NP. I reached out to Cletus Gash by phone today in response to a referral sent by Diane Chang's PCP, Rip Harbour, NP.   Diane Chang was given information about Chronic Care Management services today including:  CCM service includes personalized support from designated clinical staff supervised by her physician, including individualized plan of care and coordination with other care providers 24/7 contact phone numbers for assistance for urgent and routine care needs. Service will only be billed when office clinical staff spend 20 minutes or more in a month to coordinate care. Only one practitioner may furnish and bill the service in a calendar month. The patient may stop CCM services at any time (effective at the end of the month) by phone call to the office staff.   Patient agreed to services and verbal consent obtained.   Follow up plan:REFERRAL/ NO COPAY   Diane Chang [employer] Scheduler

## 2022-06-24 ENCOUNTER — Telehealth: Payer: Self-pay

## 2022-06-24 NOTE — Progress Notes (Signed)
Chronic Care Management Pharmacy Assistant   Name: Diane Chang  MRN: [id number] DOB: Aug 16, 1941   Reason for Encounter: Chart Prep for initial visit    Conditions to be addressed/monitored: HTN, HLD, DMII, GERD, and Osteoarthritis, Obesity, Pituitary Adenoma, Benign paroxysmal positional vertigo, Osteoporosis, Cardiac Murmur, Chronic shoulder pain.   Recent office visits:  06/04/22 Diane Duncans PA-C. Seen for arm pain. Started on Prednisone '20mg'$ .   04/16/22 Diane Chang. Seen for routine visit. Started on  Semaglutide 0.'5mg'$  weekly. D/C Dulaglutide 1.'5mg'$ . Referral to [facility] Coordination.   Recent consult visits:  04/05/22 (Gastroenterology) Diane Blase PA-C. Seen for hospital follow up. Ordered Mesalamine 1.2gm and Mesalamine 4grams.   Hospital visits:  Medication Reconciliation was completed by comparing discharge summary, patient's EMR and Pharmacy list, and upon discussion with patient.  Admitted to the hospital on 02/12/22 due to Epiglottitis and Supraglottitis. [facility] Kitchen Discharge date was 02/12/22. Discharged from [facility].    Medications Discontinued at Hospital Discharge: -Stopped naproxen 500 MG tablet  -All other medications will remain the same.    Medications: Outpatient Encounter Medications as of 06/24/2022  Medication Sig   alendronate (FOSAMAX) 70 MG tablet Take 1 tablet (70 mg total) by mouth once a week. Take with a full glass of water on an empty stomach.   aspirin EC 81 MG tablet Take 81 mg by mouth daily. Swallow whole.   atorvastatin (LIPITOR) 40 MG tablet Take 1 tablet (40 mg total) by mouth daily.   diphenoxylate-atropine (LOMOTIL) 2.5-0.025 MG tablet Take 1 tablet by mouth 4 (four) times daily as needed for diarrhea or loose stools.   lisinopril-hydrochlorothiazide (ZESTORETIC) 20-12.5 MG tablet Take 1 tablet by mouth daily.   meclizine (ANTIVERT) 25 MG tablet Take 1 tablet (25 mg total) by mouth 3 (three) times daily as  needed.   mesalamine (ROWASA) 4 g enema SMARTSIG:60 Milliliter(s) Rectally Every Night   metFORMIN (GLUCOPHAGE) 500 MG tablet TAKE 1 TABLET BY MOUTH EVERY DAY WITH BREAKFAST   naproxen (NAPROSYN) 500 MG tablet Take 1 tablet (500 mg total) by mouth 2 (two) times daily.   omeprazole (PRILOSEC) 40 MG capsule TAKE 1 CAPSULE BY MOUTH TWICE A DAY BEFORE A MEAL   Semaglutide,0.25 or 0.'5MG'$ /DOS, (OZEMPIC, 0.25 OR 0.5 MG/DOSE,) 2 MG/3ML SOPN Inject 0.5 mg into the skin once a week.   No facility-administered encounter medications on file as of 06/24/2022.   Lab Results  Component Value Date/Time   HGBA1C 6.5 (H) 04/16/2022 09:25 AM   HGBA1C 6.7 (H) 12/12/2021 09:14 AM   MICROALBUR 30 12/15/2020 08:01 AM   MICROALBUR 30 04/17/2020 08:38 AM     BP Readings from Last 3 Encounters:  06/04/22 118/74  04/16/22 116/64  12/12/21 120/70      Have you seen any other providers since your last visit with PCP? No  What is your top health concern to discuss at your upcoming visit? She would like to discuss her Chronic pain in her right arm. She is having a hard time moving it. She is starting to have the same pain in both her legs, mostly at night.   Do you have any problems getting your medications from the pharmacy? No  Financial barriers? No Access barriers? No   Diane Chang was reminded to have all medications, supplements and any blood glucose and/or blood pressure readings available for review with Diane Chang, Pharm. D, at her telephone visit on 06/28/22 at 8:30am .    Star  Rating Drugs:  Medication:  Last Fill: Day Supply Atorvastatin .  05/29/22-04/06/22 30ds Lisinopril-HCTZ 05/29/22-05/06/22 30ds Metformin   06/04/22-05/06/22 30ds Semaglutide   05/30/22-04/16/22 28ds   Care Gaps: Last Annual Wellness visit?None noted  Last Colonoscopy? None noted  Last Mammogram? 04/16/19 Last Bone Denisty? 04/16/19 Last eye exam / retinopathy screening?04/12/21 Last diabetic foot  exam?12/12/21  Immunizations:  Influenza 07/21/21 Hepatitis C Postponed until 08/13/22 Pneumonia 04/16/22 HPV Aged Out Covid 02/05/20   Diane Chang, New Home Pharmacist Assistant  [phone]

## 2022-06-28 ENCOUNTER — Ambulatory Visit (INDEPENDENT_AMBULATORY_CARE_PROVIDER_SITE_OTHER): Payer: Managed Care, Other (non HMO)

## 2022-06-28 NOTE — Progress Notes (Signed)
Chronic Care Management Pharmacy Note  06/28/2022 Name:  Diane Chang MRN:  [id number] DOB:  1941/02/01  Summary: -Pleasant 81 year old female presents for initial CCM visit. She worked full time due to financial concerns, but her place of employment just shut down so she is currently unemployed. I was originally contacted to help with medication cost, but she just got approved for Medicaid and is now able to afford medications  Recommendations/Changes made from today's visit: -Increase statin to $RemoveB'80mg'tfcdkkgk$ . ASCVD=41%   Subjective: Diane Chang is an 81 y.o. year old female who is a primary patient of Rip Harbour, NP.  The CCM team was consulted for assistance with disease management and care coordination needs.    Engaged with patient by telephone for initial visit in response to provider referral for pharmacy case management and/or care coordination services.   Consent to Services:  The patient was given the following information about Chronic Care Management services today, agreed to services, and gave verbal consent: 1. CCM service includes personalized support from designated clinical staff supervised by the primary care provider, including individualized plan of care and coordination with other care providers 2. 24/7 contact phone numbers for assistance for urgent and routine care needs. 3. Service will only be billed when office clinical staff spend 20 minutes or more in a month to coordinate care. 4. Only one practitioner may furnish and bill the service in a calendar month. 5.The patient may stop CCM services at any time (effective at the end of the month) by phone call to the office staff. 6. The patient will be responsible for cost sharing (co-pay) of up to 20% of the service fee (after annual deductible is met). Patient agreed to services and consent obtained.  Patient Care Team: Rip Harbour, NP as PCP - General (Nurse Practitioner) Lane Hacker, [facility] as Pharmacist  (Pharmacist)  Recent office visits:  06/04/22 Marge Duncans PA-C. Seen for arm pain. Started on Prednisone $RemoveBefor'20mg'loZxUCQbczFx$ .    04/16/22 Jerrell Belfast NP. Seen for routine visit. Started on  Semaglutide 0.$RemoveBefore'5mg'WYTJFjrrfPqpP$  weekly. D/C Dulaglutide 1.$RemoveBeforeDEI'5mg'ZUFxDzzdNHXySGHL$ . Referral to [facility] Coordination.    Recent consult visits:  04/05/22 (Gastroenterology) Tomasa Blase PA-C. Seen for hospital follow up. Ordered Mesalamine 1.2gm and Mesalamine 4grams.    Hospital visits:  Medication Reconciliation was completed by comparing discharge summary, patient's EMR and Pharmacy list, and upon discussion with patient.   Admitted to the hospital on 02/12/22 due to Epiglottitis and Supraglottitis. Marland Kitchen Discharge date was 02/12/22. Discharged from [facility].     Medications Discontinued at Hospital Discharge: -Stopped naproxen 500 MG tablet   -All other medications will remain the same.     Objective:  Lab Results  Component Value Date   CREATININE 0.82 04/16/2022   BUN 11 04/16/2022   EGFR 73 04/16/2022   GFRNONAA 63 08/18/2020   GFRAA 72 08/18/2020   NA 143 04/16/2022   K 4.1 04/16/2022   CALCIUM 9.7 04/16/2022   CO2 26 04/16/2022   GLUCOSE 124 (H) 04/16/2022    Lab Results  Component Value Date/Time   HGBA1C 6.5 (H) 04/16/2022 09:25 AM   HGBA1C 6.7 (H) 12/12/2021 09:14 AM   MICROALBUR 30 12/15/2020 08:01 AM   MICROALBUR 30 04/17/2020 08:38 AM    Last diabetic Eye exam:  Lab Results  Component Value Date/Time   HMDIABEYEEXA No Retinopathy 04/12/2021 12:00 AM    Last diabetic Foot exam: No results found for: "HMDIABFOOTEX"   Lab  Results  Component Value Date   CHOL 175 04/16/2022   HDL 46 04/16/2022   LDLCALC 106 (H) 04/16/2022   TRIG 127 04/16/2022   CHOLHDL 3.8 04/16/2022       Latest Ref Rng & Units 04/16/2022    9:25 AM 12/12/2021    9:14 AM 08/13/2021    8:42 AM  Hepatic Function  Total Protein 6.0 - 8.5 g/dL 6.9  6.8  6.2   Albumin 3.8 - 4.8 g/dL 4.2  4.3  4.1   AST 0 - 40  IU/L $Rem'27  26  20   'WSoo$ ALT 0 - 32 IU/L $Remov'27  22  19   'LUthCs$ Alk Phosphatase 44 - 121 IU/L 53  59  51   Total Bilirubin 0.0 - 1.2 mg/dL 0.4  0.5  0.4     Lab Results  Component Value Date/Time   TSH 1.660 08/13/2021 08:42 AM   TSH 2.560 04/17/2020 08:30 AM       Latest Ref Rng & Units 04/16/2022    9:25 AM 12/12/2021    9:14 AM 08/13/2021    8:42 AM  CBC  WBC 3.4 - 10.8 x10E3/uL 5.9  5.3  5.4   Hemoglobin 11.1 - 15.9 g/dL 13.6  13.6  13.6   Hematocrit 34.0 - 46.6 % 39.9  40.5  41.5   Platelets 150 - 450 x10E3/uL 224  254  281     No results found for: "VD25OH"  Clinical ASCVD: Yes  The 10-year ASCVD risk score (Arnett DK, et al., 2019) is: 41.4%   Values used to calculate the score:     Age: 58 years     Sex: Female     Is Non-Hispanic African American: No     Diabetic: Yes     Tobacco smoker: No     Systolic Blood Pressure: 916 mmHg     Is BP treated: Yes     HDL Cholesterol: 46 mg/dL     Total Cholesterol: 175 mg/dL       04/16/2022    8:41 AM 04/16/2022    8:40 AM 12/12/2021    8:42 AM  Depression screen PHQ 2/9  Decreased Interest 0 0 0  Down, Depressed, Hopeless 0 0 0  PHQ - 2 Score 0 0 0  Altered sleeping 0  0  Tired, decreased energy 0  0  Change in appetite 0  0  Feeling bad or failure about yourself  0  0  Trouble concentrating 0  0  Moving slowly or fidgety/restless 0  0  Suicidal thoughts 0  0  PHQ-9 Score 0  0  Difficult doing work/chores Not difficult at all  Not difficult at all     Other: (CHADS2VASc if Afib, MMRC or CAT for COPD, ACT, DEXA)  Social History   Tobacco Use  Smoking Status Former   Types: Cigarettes   Quit date: 1972   Years since quitting: 51.7  Smokeless Tobacco Never   BP Readings from Last 3 Encounters:  06/04/22 118/74  04/16/22 116/64  12/12/21 120/70   Pulse Readings from Last 3 Encounters:  06/04/22 87  04/16/22 (!) 58  12/12/21 60   Wt Readings from Last 3 Encounters:  06/04/22 198 lb 9.6 oz (90.1 kg)  04/16/22 182 lb  (82.6 kg)  12/12/21 185 lb (83.9 kg)   BMI Readings from Last 3 Encounters:  06/04/22 33.05 kg/m  04/16/22 30.29 kg/m  12/12/21 33.84 kg/m    Assessment/Interventions: Review of patient past medical  history, allergies, medications, health status, including review of consultants reports, laboratory and other test data, was performed as part of comprehensive evaluation and provision of chronic care management services.   SDOH:  (Social Determinants of Health) assessments and interventions performed: Yes SDOH Interventions    Flowsheet Row Chronic Care Management from 06/28/2022 in [place] Office Visit from 04/16/2022 in [place] Office Visit from 12/15/2020 in [place] Interventions     Food Insecurity Interventions -- Intervention Not Indicated --  Housing Interventions -- Intervention Not Indicated --  Transportation Interventions Intervention Not Indicated Intervention Not Indicated --  Depression Interventions/Treatment  -- -- Medication  Financial Strain Interventions Intervention Not Indicated Intervention Not Indicated --  Physical Activity Interventions -- Intervention Not Indicated --  Stress Interventions -- Intervention Not Indicated --  Social Connections Interventions -- Intervention Not Indicated --      SDOH Screenings   Food Insecurity: No Food Insecurity (04/16/2022)  Housing: Low Risk  (04/16/2022)  Transportation Needs: No Transportation Needs (06/28/2022)  Alcohol Screen: Low Risk  (04/16/2022)  Depression (PHQ2-9): Low Risk  (04/16/2022)  Financial Resource Strain: Low Risk  (06/28/2022)  Physical Activity: Inactive (04/16/2022)  Social Connections: Socially Isolated (04/16/2022)  Stress: No Stress Concern Present (04/16/2022)  Tobacco Use: Medium Risk (06/04/2022)    [place]  No Known Allergies  Medications Reviewed Today     Reviewed by Lane Hacker, [facility] (Pharmacist) on 06/28/22 at [place] List Status:  <None>   Medication Order Taking? Sig Documenting Provider Last Dose Status Informant  alendronate (FOSAMAX) 70 MG tablet [id number] No Take 1 tablet (70 mg total) by mouth once a week. Take with a full glass of water on an empty stomach. Rip Harbour, NP Taking Active   aspirin EC 81 MG tablet [id number] No Take 81 mg by mouth daily. Swallow whole. [provider] Taking Active   atorvastatin (LIPITOR) 40 MG tablet [id number] No Take 1 tablet (40 mg total) by mouth daily. Rip Harbour, NP Taking Active   diphenoxylate-atropine (LOMOTIL) 2.5-0.025 MG tablet [id number] No Take 1 tablet by mouth 4 (four) times daily as needed for diarrhea or loose stools. Lillard Anes, MD Taking Active   lisinopril-hydrochlorothiazide (ZESTORETIC) 20-12.5 MG tablet [id number] No Take 1 tablet by mouth daily. Rip Harbour, NP Taking Active   meclizine (ANTIVERT) 25 MG tablet [id number] No Take 1 tablet (25 mg total) by mouth 3 (three) times daily as needed. Rip Harbour, NP Taking Active   mesalamine (ROWASA) 4 g enema [id number] No SMARTSIG:60 Milliliter(s) Rectally Every Night [provider] Taking Active   metFORMIN (GLUCOPHAGE) 500 MG tablet [id number] No TAKE 1 TABLET BY MOUTH EVERY DAY WITH BREAKFAST Rip Harbour, NP Taking Active   naproxen (NAPROSYN) 500 MG tablet [id number] No Take 1 tablet (500 mg total) by mouth 2 (two) times daily. Rip Harbour, NP Taking Active   omeprazole (PRILOSEC) 40 MG capsule [id number] No TAKE 1 CAPSULE BY MOUTH TWICE A DAY BEFORE A MEAL Lillard Anes, MD Taking Active   Semaglutide,0.25 or 0.5MG /DOS, (OZEMPIC, 0.25 OR 0.5 MG/DOSE,) 2 MG/3ML SOPN [id number] No Inject 0.5 mg into the skin once a week. 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[place]) 04/05/2022  Depression,  major, single episode, severe ([place]) 12/12/2021   Cardiac murmur 08/25/2020   Age-related osteoporosis without current pathological fracture    DM type 2 with diabetic mixed hyperlipidemia ([place]) 08/18/2020   Benign paroxysmal positional vertigo 05/01/2020   Osteoarthritis of hip 12/17/2019   Pituitary adenoma ([place]) 12/17/2019   Mixed hyperlipidemia 12/17/2019   Benign hypertension 12/17/2019   Obesity, diabetes, and hypertension syndrome ([place]) 12/17/2019   GERD (gastroesophageal reflux disease) 12/17/2019    Immunization History  Administered Date(s) Administered   Fluad Quad(high Dose 65+) 08/19/2019   Influenza, High Dose Seasonal PF 07/26/2020   Influenza-Unspecified 07/21/2021   PFIZER(Purple Top)SARS-COV-2 Vaccination 01/08/2020, 02/05/2020   Pneumococcal Polysaccharide-23 04/16/2022    Conditions to be addressed/monitored:  Hypertension, Hyperlipidemia, Diabetes, and Osteoporosis  Care Plan : [place]  Updates made by Lane Hacker, RPH since 06/28/2022 12:00 AM     Problem: Disease State Management      Long-Range Goal: DM, Osteo, HTN   Start Date: 06/28/2022  Expected End Date: 06/28/2023  This Visit's Progress: On track  Priority: High  Note:   Current Barriers:  Does not contact provider office for questions/concerns  Pharmacist Clinical Goal(s):  Patient will contact provider office for questions/concerns as evidenced notation of same in electronic health record through collaboration with PharmD and provider.   Interventions: 1:1 collaboration with Rip Harbour, NP regarding development and update of comprehensive plan of care as evidenced by provider attestation and co-signature Inter-disciplinary care team collaboration (see longitudinal plan of care) Comprehensive medication review performed; medication list updated in electronic medical record  Hypertension (BP goal <130/80) BP Readings from Last 3 Encounters:  06/04/22 118/74   04/16/22 116/64  12/12/21 120/70  -Controlled -Current treatment: Lisinopril/HCTZ 20/12.5 QD Appropriate, Effective, Safe, Accessible -Medications previously tried: N/A  -Current home readings:  Sept 2023: 127/56 -Current dietary habits: "Tries to eat healthy" -Current exercise habits:  Sept 2023: Was working full time but her place of employment just closed -Denies hypotensive/hypertensive symptoms -Educated on BP goals and benefits of medications for prevention of heart attack, stroke and kidney damage; -Counseled to monitor BP at home weekly, document, and provide log at future appointments -Recommended to continue current medication  Hyperlipidemia: (LDL goal < 100) The 10-year ASCVD risk score (Arnett DK, et al., 2019) is: 41.4%   Values used to calculate the score:     Age: 34 years     Sex: Female     Is Non-Hispanic African American: No     Diabetic: Yes     Tobacco smoker: No     Systolic Blood Pressure: 646 mmHg     Is BP treated: Yes     HDL Cholesterol: 46 mg/dL     Total Cholesterol: 175 mg/dL Lab Results  Component Value Date   CHOL 175 04/16/2022   CHOL 135 12/12/2021   CHOL 153 08/13/2021   Lab Results  Component Value Date   HDL 46 04/16/2022   HDL 39 (L) 12/12/2021   HDL 40 08/13/2021   Lab Results  Component Value Date   LDLCALC 106 (H) 04/16/2022   LDLCALC 77 12/12/2021   LDLCALC 91 08/13/2021   Lab Results  Component Value Date   TRIG 127 04/16/2022   TRIG 105 12/12/2021   TRIG 123 08/13/2021   Lab Results  Component Value Date   CHOLHDL 3.8 04/16/2022   CHOLHDL 3.5 12/12/2021   CHOLHDL 3.8 08/13/2021  No results found for: "LDLDIRECT" Last vitamin D  No results found for: "25OHVITD2", "25OHVITD3", "VD25OH" Lab Results  Component Value Date   TSH 1.660 08/13/2021  -Controlled -Current treatment: Atorvastatin 40mg  Appropriate, Query effective,  -Medications previously tried: N/A  -Current dietary patterns: "Tries to eat  healthy" -Current exercise habits: N/A -Educated on Cholesterol goals;  Sept 2023: Recommend increasing to 80mg   Diabetes (A1c goal <7%) Lab Results  Component Value Date   HGBA1C 6.5 (H) 04/16/2022   HGBA1C 6.7 (H) 12/12/2021   HGBA1C 6.2 (H) 08/13/2021   Lab Results  Component Value Date   MICROALBUR 30 12/15/2020   LDLCALC 106 (H) 04/16/2022   CREATININE 0.82 04/16/2022   Lab Results  Component Value Date   NA 143 04/16/2022   K 4.1 04/16/2022   CREATININE 0.82 04/16/2022   EGFR 73 04/16/2022   GFRNONAA 63 08/18/2020   GLUCOSE 124 (H) 04/16/2022   Lab Results  Component Value Date   WBC 5.9 04/16/2022   HGB 13.6 04/16/2022   HCT 39.9 04/16/2022   MCV 97 04/16/2022   PLT 224 04/16/2022   Lab Results  Component Value Date   LABMICR 8.2 04/16/2022   MICROALBUR 30 12/15/2020   MICROALBUR 30 04/17/2020  -Controlled -Current medications: Ozempic 0.5mg  weekly Appropriate, Effective, Safe, Accessible Metformin 500mg  Appropriate, Effective, Safe, Accessible -Medications previously tried: N/A  -Current home glucose readings fasting glucose:  Sept 2023: 06/28/22: 115 -Denies hypoglycemic/hyperglycemic symptoms -Current exercise habits:  Sept 2023: Was working full time but her place of employment just closed -Educated on A1c and blood sugar goals; -Counseled to check feet daily and get yearly eye exams Sept 2023: Was originally referred because couldn't afford Ozempic. Just got Medicaid and cost is $10 now -Recommended to continue current medication   Osteoporosis / Osteopenia (Goal: prevent fracture) -Controlled -Last DEXA Scan:    T-Score femoral neck:  04/16/19: -1.2 12/11/16: -1.1 04/16/06: -0.6  T-Score lumbar spine:  04/16/19: -1.1 12/11/16: -1.3 04/16/06: -0.6  T-Score Total Mean:  04/16/19: 0.0 12/11/16: 0.0 04/16/06: 0.7  10-year probability of major osteoporotic fracture:  Sept 2023: 14%  10-year probability of hip fracture:  Sept 2023: 4.2% -Patient  is a candidate for pharmacologic treatment due to FRAX Score -Current treatment  Alendronate 70mg  Appropriate, Effective, Safe, Accessible Per [facility] at [facility]: Started Feb 2022 -Medications previously tried: N/A  -Recommend [phone] units of vitamin D daily. Recommend 1200 mg of calcium daily from dietary and supplemental sources. -Recommended to continue current medication   Patient Goals/Self-Care Activities Patient will:  - take medications as prescribed as evidenced by patient report and record review  Follow Up Plan: The patient has been provided with contact information for the care management team and has been advised to call with any health related questions or concerns.   CPP F/U PRN  [place] Constable, Pharm.D. - [phone]        Medication Assistance: None required.  Patient affirms current coverage meets needs.  Compliance/Adherence/Medication fill history: [facility] Drugs:  Medication:                Last Fill:         Day Supply Atorvastatin .               05/29/22-04/06/22 30ds Lisinopril-HCTZ           05/29/22-05/06/22 30ds Metformin                    06/04/22-05/06/22 30ds Semaglutide  05/30/22-04/16/22 28ds     Care Gaps: Last Annual Wellness visit?None noted  Last Colonoscopy? None noted  Last Mammogram? 04/16/19 Last Bone Denisty? 04/16/19 Last eye exam / retinopathy screening?04/12/21 Last diabetic foot exam?12/12/21   Immunizations:  Influenza 07/21/21 Hepatitis C Postponed until 08/13/22 Pneumonia 04/16/22 HPV Aged Out Covid 02/05/20  Patient's preferred pharmacy is:  [id number] - [place], [address] [place] [address] Phone: [phone] Fax: [phone]  Uses pill box? No -   Pt endorses 100% compliance   Care Plan and Follow Up Patient Decision:  Patient agrees to Care Plan and Follow-up.  Plan: The patient has been provided with contact information for the care management team and has  been advised to call with any health related questions or concerns.   CPP F/U PRN  [place] Constable, Pharm.D. - [phone]

## 2022-06-28 NOTE — Patient Instructions (Signed)
Visit Information   Goals Addressed   None    Patient Care Plan: CCM Pharmacy Care Plan     Problem Identified: Disease State Management      Long-Range Goal: DM, Osteo, HTN   Start Date: 06/28/2022  Expected End Date: 06/28/2023  This Visit's Progress: On track  Priority: High  Note:   Current Barriers:  Does not contact provider office for questions/concerns  Pharmacist Clinical Goal(s):  Patient will contact provider office for questions/concerns as evidenced notation of same in electronic health record through collaboration with PharmD and provider.   Interventions: 1:1 collaboration with Rip Harbour, NP regarding development and update of comprehensive plan of care as evidenced by provider attestation and co-signature Inter-disciplinary care team collaboration (see longitudinal plan of care) Comprehensive medication review performed; medication list updated in electronic medical record  Hypertension (BP goal <130/80) BP Readings from Last 3 Encounters:  06/04/22 118/74  04/16/22 116/64  12/12/21 120/70  -Controlled -Current treatment: Lisinopril/HCTZ 20/12.5 QD Appropriate, Effective, Safe, Accessible -Medications previously tried: N/A  -Current home readings:  Sept 2023: 127/56 -Current dietary habits: "Tries to eat healthy" -Current exercise habits:  Sept 2023: Was working full time but her place of employment just closed -Denies hypotensive/hypertensive symptoms -Educated on BP goals and benefits of medications for prevention of heart attack, stroke and kidney damage; -Counseled to monitor BP at home weekly, document, and provide log at future appointments -Recommended to continue current medication  Hyperlipidemia: (LDL goal < 100) The 10-year ASCVD risk score (Arnett DK, et al., 2019) is: 41.4%   Values used to calculate the score:     Age: 6 years     Sex: Female     Is Non-Hispanic African American: No     Diabetic: Yes     Tobacco smoker:  No     Systolic Blood Pressure: 355 mmHg     Is BP treated: Yes     HDL Cholesterol: 46 mg/dL     Total Cholesterol: 175 mg/dL Lab Results  Component Value Date   CHOL 175 04/16/2022   CHOL 135 12/12/2021   CHOL 153 08/13/2021   Lab Results  Component Value Date   HDL 46 04/16/2022   HDL 39 (L) 12/12/2021   HDL 40 08/13/2021   Lab Results  Component Value Date   LDLCALC 106 (H) 04/16/2022   LDLCALC 77 12/12/2021   LDLCALC 91 08/13/2021   Lab Results  Component Value Date   TRIG 127 04/16/2022   TRIG 105 12/12/2021   TRIG 123 08/13/2021   Lab Results  Component Value Date   CHOLHDL 3.8 04/16/2022   CHOLHDL 3.5 12/12/2021   CHOLHDL 3.8 08/13/2021  No results found for: "LDLDIRECT" Last vitamin D No results found for: "25OHVITD2", "25OHVITD3", "VD25OH" Lab Results  Component Value Date   TSH 1.660 08/13/2021  -Controlled -Current treatment: Atorvastatin 62m Appropriate, Query effective,  -Medications previously tried: N/A  -Current dietary patterns: "Tries to eat healthy" -Current exercise habits: N/A -Educated on Cholesterol goals;  Sept 2023: Recommend increasing to 851m Diabetes (A1c goal <7%) Lab Results  Component Value Date   HGBA1C 6.5 (H) 04/16/2022   HGBA1C 6.7 (H) 12/12/2021   HGBA1C 6.2 (H) 08/13/2021   Lab Results  Component Value Date   MICROALBUR 30 12/15/2020   LDLCALC 106 (H) 04/16/2022   CREATININE 0.82 04/16/2022   Lab Results  Component Value Date   NA 143 04/16/2022   K 4.1 04/16/2022   CREATININE 0.82 04/16/2022  EGFR 73 04/16/2022   GFRNONAA 63 08/18/2020   GLUCOSE 124 (H) 04/16/2022   Lab Results  Component Value Date   WBC 5.9 04/16/2022   HGB 13.6 04/16/2022   HCT 39.9 04/16/2022   MCV 97 04/16/2022   PLT 224 04/16/2022   Lab Results  Component Value Date   LABMICR 8.2 04/16/2022   MICROALBUR 30 12/15/2020   MICROALBUR 30 04/17/2020  -Controlled -Current medications: Ozempic 0.37m weekly Appropriate,  Effective, Safe, Accessible Metformin 501mAppropriate, Effective, Safe, Accessible -Medications previously tried: N/A  -Current home glucose readings fasting glucose:  Sept 2023: 06/28/22: 115 -Denies hypoglycemic/hyperglycemic symptoms -Current exercise habits:  Sept 2023: Was working full time but her place of employment just closed -Educated on A1c and blood sugar goals; -Counseled to check feet daily and get yearly eye exams Sept 2023: Was originally referred because couldn't afford Ozempic. Just got Medicaid and cost is $10 now -Recommended to continue current medication   Osteoporosis / Osteopenia (Goal: prevent fracture) -Controlled -Last DEXA Scan:    T-Score femoral neck:  04/16/19: -1.2 12/11/16: -1.1 04/16/06: -0.6  T-Score lumbar spine:  04/16/19: -1.1 12/11/16: -1.3 04/16/06: -0.6  T-Score Total Mean:  04/16/19: 0.0 12/11/16: 0.0 04/16/06: 0.7  10-year probability of major osteoporotic fracture:  Sept 2023: 14%  10-year probability of hip fracture:  Sept 2023: 4.2% -Patient is a candidate for pharmacologic treatment due to FRAX Score -Current treatment  Alendronate 7066mppropriate, Effective, Safe, Accessible Per [facility]: Started Feb 2022 -Medications previously tried: N/A  -Recommend [phone] units of vitamin D daily. Recommend 1200 mg of calcium daily from dietary and supplemental sources. -Recommended to continue current medication   Patient Goals/Self-Care Activities Patient will:  - take medications as prescribed as evidenced by patient report and record review  Follow Up Plan: The patient has been provided with contact information for the care management team and has been advised to call with any health related questions or concerns.   CPP F/U PRN  NatArizona Constableharm.D. - 3- [phone]    Ms. TysStepanians given information about Chronic Care Management services today including:  CCM service includes personalized support from designated  clinical staff supervised by her physician, including individualized plan of care and coordination with other care providers 24/7 contact phone numbers for assistance for urgent and routine care needs. Standard insurance, coinsurance, copays and deductibles apply for chronic care management only during months in which we provide at least 20 minutes of these services. Most insurances cover these services at 100%, however patients may be responsible for any copay, coinsurance and/or deductible if applicable. This service may help you avoid the need for more expensive face-to-face services. Only one practitioner may furnish and bill the service in a calendar month. The patient may stop CCM services at any time (effective at the end of the month) by phone call to the office staff.  Patient agreed to services and verbal consent obtained.   The patient verbalized understanding of instructions, educational materials, and care plan provided today and DECLINED offer to receive copy of patient instructions, educational materials, and care plan.  CPP F/U PRN  NatLane HackerPHTuscola

## 2022-07-05 DIAGNOSIS — L259 Unspecified contact dermatitis, unspecified cause: Secondary | ICD-10-CM | POA: Diagnosis not present

## 2022-07-06 DIAGNOSIS — M160 Bilateral primary osteoarthritis of hip: Secondary | ICD-10-CM

## 2022-07-06 DIAGNOSIS — I10 Essential (primary) hypertension: Secondary | ICD-10-CM

## 2022-07-06 DIAGNOSIS — E1169 Type 2 diabetes mellitus with other specified complication: Secondary | ICD-10-CM

## 2022-07-06 DIAGNOSIS — E782 Mixed hyperlipidemia: Secondary | ICD-10-CM

## 2022-07-18 ENCOUNTER — Ambulatory Visit: Payer: Medicare Other | Admitting: Nurse Practitioner

## 2022-07-18 ENCOUNTER — Ambulatory Visit (INDEPENDENT_AMBULATORY_CARE_PROVIDER_SITE_OTHER): Payer: 59 | Admitting: Nurse Practitioner

## 2022-07-18 ENCOUNTER — Encounter: Payer: Self-pay | Admitting: Nurse Practitioner

## 2022-07-18 VITALS — BP 128/70 | HR 69 | Temp 97.0°F | Ht 66.0 in | Wt 202.0 lb

## 2022-07-18 DIAGNOSIS — E782 Mixed hyperlipidemia: Secondary | ICD-10-CM | POA: Diagnosis not present

## 2022-07-18 DIAGNOSIS — M19011 Primary osteoarthritis, right shoulder: Secondary | ICD-10-CM

## 2022-07-18 DIAGNOSIS — Z780 Asymptomatic menopausal state: Secondary | ICD-10-CM | POA: Diagnosis not present

## 2022-07-18 DIAGNOSIS — Z13820 Encounter for screening for osteoporosis: Secondary | ICD-10-CM | POA: Diagnosis not present

## 2022-07-18 DIAGNOSIS — E6609 Other obesity due to excess calories: Secondary | ICD-10-CM

## 2022-07-18 DIAGNOSIS — Z1231 Encounter for screening mammogram for malignant neoplasm of breast: Secondary | ICD-10-CM | POA: Diagnosis not present

## 2022-07-18 DIAGNOSIS — I10 Essential (primary) hypertension: Secondary | ICD-10-CM

## 2022-07-18 DIAGNOSIS — E1169 Type 2 diabetes mellitus with other specified complication: Secondary | ICD-10-CM | POA: Diagnosis not present

## 2022-07-18 DIAGNOSIS — Z23 Encounter for immunization: Secondary | ICD-10-CM | POA: Diagnosis not present

## 2022-07-18 DIAGNOSIS — Z6832 Body mass index (BMI) 32.0-32.9, adult: Secondary | ICD-10-CM

## 2022-07-18 DIAGNOSIS — M160 Bilateral primary osteoarthritis of hip: Secondary | ICD-10-CM

## 2022-07-18 DIAGNOSIS — K219 Gastro-esophageal reflux disease without esophagitis: Secondary | ICD-10-CM | POA: Diagnosis not present

## 2022-07-18 MED ORDER — MELOXICAM 15 MG PO TABS: 15.0000 mg | ORAL_TABLET | Freq: Every day | ORAL | 1 refills | Status: DC

## 2022-07-18 MED ORDER — METFORMIN HCL 500 MG PO TABS: ORAL_TABLET | ORAL | 2 refills | Status: DC

## 2022-07-18 NOTE — Progress Notes (Signed)
Subjective:  Patient ID: Diane Chang, female    DOB: Jun 06, 1941  Age: 81 y.o. MRN: [id number]  Chief Complaint  Patient presents with   Hyperlipidemia   Diabetes   Hypertension    HPI    Pt presents for follow-up for T2DM, HTN, and hyperlipidemia. Recently lost her job at [employer]. She lost her insurance temporarily. She was unable to have mammogram or DEXA scan due to insurance lapse. She has requested to reschedule tests at [facility]. She reports chronic right shoulder pain and stiffness. Symptoms are worse in the morning and during cold weather. She denies previous shoulder injury.   Diabetes Mellitus Type II, Follow-up  Lab Results  Component Value Date   HGBA1C 6.5 (H) 04/16/2022   HGBA1C 6.7 (H) 12/12/2021   HGBA1C 6.2 (H) 08/13/2021   Wt Readings from Last 3 Encounters:  07/18/22 202 lb (91.6 kg)  06/04/22 198 lb 9.6 oz (90.1 kg)  04/16/22 182 lb (82.6 kg)   Last seen for diabetes 3 months ago.  Management  includes metformin and ozempic. She reports excellent compliance with treatment. She is not having side effects.   Most Recent Eye Exam: UTD   Pertinent Labs: Lab Results  Component Value Date   CHOL 175 04/16/2022   HDL 46 04/16/2022   LDLCALC 106 (H) 04/16/2022   TRIG 127 04/16/2022   CHOLHDL 3.8 04/16/2022   Lab Results  Component Value Date   NA 143 04/16/2022   K 4.1 04/16/2022   CREATININE 0.82 04/16/2022   EGFR 73 04/16/2022   GFRNONAA 63 08/18/2020   GLUCOSE 124 (H) 04/16/2022     She was last seen for hypertension 3 months ago.  BP at that visit was 116/64. Management includes lisinopril-HCTZ.  She reports excellent compliance with treatment. She  having side effects.  She is following a Regular diet. She is not exercising. She does not smoke.    Pertinent labs: Lab Results  Component Value Date   CHOL 175 04/16/2022   HDL 46 04/16/2022   LDLCALC 106 (H) 04/16/2022   TRIG 127 04/16/2022   CHOLHDL  3.8 04/16/2022   Lab Results  Component Value Date   NA 143 04/16/2022   K 4.1 04/16/2022   CREATININE 0.82 04/16/2022   EGFR 73 04/16/2022   GFRNONAA 63 08/18/2020   GLUCOSE 124 (H) 04/16/2022     Lipid/Cholesterol, Follow-up  Last lipid panel Other pertinent labs  Lab Results  Component Value Date   CHOL 175 04/16/2022   HDL 46 04/16/2022   LDLCALC 106 (H) 04/16/2022   TRIG 127 04/16/2022   CHOLHDL 3.8 04/16/2022   Lab Results  Component Value Date   ALT 27 04/16/2022   AST 27 04/16/2022   PLT 224 04/16/2022   TSH 1.660 08/13/2021     She was last seen for this 3 months ago.  Management includes lipitor.  She reports excellent compliance with treatment. She is not having side effects.   GERD, Follow up:  The patient was last seen for GERD 3 months ago. Current treatment consist of: Prilosec  She reports excellent compliance with treatment. She is not having side effects. .   Current Outpatient Medications on File Prior to Visit  Medication Sig Dispense Refill   alendronate (FOSAMAX) 70 MG tablet Take 1 tablet (70 mg total) by mouth once a week. Take with a full glass of water on an empty stomach. 12 tablet 2   aspirin EC 81 MG tablet  Take 81 mg by mouth daily. Swallow whole.     atorvastatin (LIPITOR) 40 MG tablet Take 1 tablet (40 mg total) by mouth daily. 90 tablet 2   diphenoxylate-atropine (LOMOTIL) 2.5-0.025 MG tablet Take 1 tablet by mouth 4 (four) times daily as needed for diarrhea or loose stools. 30 tablet 0   lisinopril-hydrochlorothiazide (ZESTORETIC) 20-12.5 MG tablet Take 1 tablet by mouth daily. 90 tablet 2   meclizine (ANTIVERT) 25 MG tablet Take 1 tablet (25 mg total) by mouth 3 (three) times daily as needed. 30 tablet 2   mesalamine (ROWASA) 4 g enema SMARTSIG:60 Milliliter(s) Rectally Every Night     metFORMIN (GLUCOPHAGE) 500 MG tablet TAKE 1 TABLET BY MOUTH EVERY DAY WITH BREAKFAST 30 tablet 2   naproxen (NAPROSYN) 500 MG tablet Take 1  tablet (500 mg total) by mouth 2 (two) times daily. 60 tablet 2   omeprazole (PRILOSEC) 40 MG capsule TAKE 1 CAPSULE BY MOUTH TWICE A DAY BEFORE A MEAL 60 capsule 6   Semaglutide,0.25 or 0.5MG/DOS, (OZEMPIC, 0.25 OR 0.5 MG/DOSE,) 2 MG/3ML SOPN Inject 0.5 mg into the skin once a week. 9 mL 1   No current facility-administered medications on file prior to visit.   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   Past Surgical History:  Procedure Laterality Date   CHOLECYSTECTOMY      Family History  Problem Relation Age of Onset   Heart attack Sister    Social History   Socioeconomic History   Marital status: Married    Spouse name: Not on file   Number of children: 1   Years of education: Not on file   Highest education level: Not on file  Occupational History   Occupation: packing  Tobacco Use   Smoking status: Former    Types: Cigarettes    Quit date: 1972    Years since quitting: 51.8   Smokeless tobacco: Never  Substance and Sexual Activity   Alcohol use: Never   Drug use: Never   Sexual activity: Not Currently  Other Topics Concern   Not on file  Social History Narrative   Not on file   Social Determinants of Health   Financial Resource Strain: Low Risk  (06/28/2022)   Overall Financial Resource Strain (CARDIA)    Difficulty of Paying Living Expenses: Not hard at all  Food Insecurity: No Food Insecurity (04/16/2022)    Hunger Vital Sign    Worried About Running Out of Food in the Last Year: Never true    Ran Out of Food in the Last Year: Never true  Transportation Needs: No Transportation Needs (06/28/2022)   PRAPARE - Hydrologist (Medical): No    Lack of Transportation (Non-Medical): No  Physical Activity: Inactive (04/16/2022)   Exercise Vital Sign    Days of Exercise per Week: 0 days    Minutes of Exercise per Session: 0 min  Stress: No Stress Concern Present (04/16/2022)   [employer]  [employer]upational [employer] - Occupational Stress Questionnaire    Feeling of Stress : Not at all  Social Connections: Socially Isolated (04/16/2022)   Social Connection and Isolation Panel [NHANES]    Frequency of Communication with Friends and Family: More than three times a week    Frequency of Social Gatherings with Friends and Family: More than three times a week    Attends Religious Services: Never    Marine scientist or Organizations: No    Attends Archivist Meetings: Never    Marital Status: Divorced    Review of Systems  Constitutional:  Negative for chills, fatigue and fever.  HENT:  Negative for congestion, ear pain, rhinorrhea and sore throat.   Respiratory:  Negative for cough and shortness of breath.   Cardiovascular:  Negative for chest pain.  Gastrointestinal:  Negative for abdominal pain, constipation, diarrhea, nausea and vomiting.  Genitourinary:  Negative for dysuria and urgency.  Musculoskeletal:  Positive for arthralgias (right shoulder, chronic) and myalgias (right shoulder). Negative for back pain.  Neurological:  Negative for dizziness, weakness, light-headedness and headaches.  Psychiatric/Behavioral:  Negative for dysphoric mood. The patient is not nervous/anxious.      Objective:  BP 128/70   Pulse 69   Temp (!) 97 F (36.1 C)   Ht _0  (1.676 m)   Wt 202 lb (91.6 kg)   LMP  (LMP Unknown)   SpO2 99%   BMI 32.60 kg/m       07/18/2022    7:58 AM 06/04/2022    2:44 PM 04/16/2022    8:38 AM  BP/Weight  Systolic BP 025 852 778  Diastolic BP 70 74 64  Wt. (Lbs) 202 198.6 182  BMI 32.6 kg/m2 33.05 kg/m2 30.29 kg/m2    Physical Exam Vitals reviewed.  Constitutional:      Appearance: Normal appearance.  HENT:     Head: Normocephalic.     Right Ear: Tympanic membrane normal.     Left Ear: Tympanic membrane normal.     Nose: Nose normal.     Mouth/Throat:     Mouth: Mucous membranes are moist.  Eyes:     Pupils: Pupils are equal, round, and reactive to light.  Cardiovascular:     Rate and Rhythm: Normal rate and regular rhythm.  Pulmonary:     Effort: Pulmonary effort is normal.     Breath sounds: Normal breath sounds.  Abdominal:     General: Bowel sounds are normal.     Palpations: Abdomen is soft.  Musculoskeletal:        General: Tenderness (right shoulder) present.     Right shoulder: Tenderness present. Decreased range of motion.     Cervical back: Neck supple.  Skin:    General: Skin is warm and dry.     Capillary Refill: Capillary refill takes less than 2 seconds.  Neurological:     General: No focal deficit present.     Mental Status: She is alert and oriented to person, place, and time.  Psychiatric:        Mood and Affect: Mood normal.        Behavior: Behavior normal.     Lab Results  Component Value Date   WBC 5.9 04/16/2022   HGB 13.6 04/16/2022   HCT 39.9 04/16/2022   PLT 224 04/16/2022   GLUCOSE 124 (H) 04/16/2022   CHOL 175 04/16/2022   TRIG 127 04/16/2022   HDL 46 04/16/2022   LDLCALC 106 (H) 04/16/2022  ALT 27 04/16/2022   AST 27 04/16/2022   NA 143 04/16/2022   K 4.1 04/16/2022   CL 105 04/16/2022   CREATININE 0.82 04/16/2022   BUN 11 04/16/2022   CO2 26 04/16/2022   TSH 1.660 08/13/2021   HGBA1C 6.5 (H) 04/16/2022   MICROALBUR 30 12/15/2020      Assessment & Plan:  1. DM type 2 with diabetic mixed hyperlipidemia (HCC)-well controlled - CBC with  Differential/Platelet - Hemoglobin A1c - Lipid panel - metFORMIN (GLUCOPHAGE) 500 MG tablet; TAKE 1 TABLET BY MOUTH EVERY DAY WITH BREAKFAST  Dispense: 90 tablet; Refill: 2  2. Benign hypertension-well controlled - CBC with Differential/Platelet - Comprehensive metabolic panel  3. Primary osteoarthritis of both hips - meloxicam (MOBIC) 15 MG tablet; Take 1 tablet (15 mg total) by mouth daily.  Dispense: 90 tablet; Refill: 1 -stop Naproxen  4. Gastroesophageal reflux disease, unspecified whether esophagitis present-well controlled - CBC with Differential/Platelet - Comprehensive metabolic panel -continue Omeprazole 40 mg QD  5. Primary osteoarthritis of right shoulder - meloxicam (MOBIC) 15 MG tablet; Take 1 tablet (15 mg total) by mouth daily.  Dispense: 90 tablet; Refill: 1  6. Encounter for osteoporosis screening in asymptomatic postmenopausal patient - DG BONE DENSITY (DXA); Future  7. Encounter for screening mammogram for malignant neoplasm of breast - MM Digital Screening; Future  8. Need for immunization against influenza - Flu Vaccine QUAD High Dose(Fluad)  9. Class 1 obesity due to excess calories with serious comorbidity and body mass index (BMI) of 32.0 to 32.9 in adult - CBC with Differential/Platelet - Comprehensive metabolic panel - Hemoglobin A1c - Lipid panel   Stop Naproxen Begin Mobic 15 mg daily We will call you with lab results, mammogram and DEXA scan                     Follow-up: 82-month, fasting  An After Visit Summary was printed and given to the patient.  I, SRip Harbour NP, have reviewed all documentation for this visit. The documentation on 07/18/22 for the exam, diagnosis, procedures, and orders are all accurate and complete.    Signed, SRip Harbour NP CO[place]([phone]

## 2022-07-18 NOTE — Patient Instructions (Addendum)
Stop Naproxen Begin Mobic 15 mg daily We will call you with lab results, mammogram and DEXA scan                                                                      Shoulder Range of Motion Exercises Shoulder range of motion (ROM) exercises are done to keep the shoulder moving freely or to increase movement. They are recommended for people who have shoulder pain or stiffness or who are recovering from a shoulder surgery. Ask your health care provider which exercises are safe for you. Do exercises exactly as told by your health care provider and adjust them as directed. It is normal to feel mild stretching, pulling, tightness, or discomfort as you do these exercises. Stop right away if you feel sudden pain or your pain gets worse. Do not begin these exercises until told by your health care provider. Phase 1 exercise When you are able, do this exercise 1-2 times a day for 30-60 seconds in each direction, or as directed by your health care provider. Pendulum exercise     To do this exercise while sitting: Sit in a chair or at the edge of your bed with your feet flat on the floor. Let your affected arm hang down in front of you over the edge of the bed or chair. Relax your shoulder, arm, and hand. Rock your body so your arm gently swings in small circles. You can also use your unaffected arm to start the motion. Repeat, changing the direction of the circles, swinging your arm left and right, and swinging your arm forward and back. To do this exercise while standing: Stand next to a sturdy chair or table, and hold on to it with your hand on your unaffected side. Bend forward at the waist. Bend your knees slightly. Relax your shoulder, arm, and hand. While keeping your shoulder relaxed, use body motion to swing your arm in small circles. Repeat, changing the direction of the circles, swinging your arm left and right, and swinging your arm forward and back. Between exercises, stand up tall and  take a short break to relax your lower back.  Phase 2 exercises Do these exercises 1-2 times a day or as told by your health care provider. Hold each stretch for 30 seconds, and repeat 3 times. Do the exercises with one or both arms as instructed by your health care provider. For these exercises, sit at a table with your hand and arm supported by the table. A chair that slides easily or has wheels can be helpful. External rotation  Turn your chair so that your affected side is nearest to the table. Place your forearm on the table to your side. Bend your arm to about a 90-degree angle (right angle) at the elbow, and place your hand palm-down on the table. Your elbow should be about 6 inches (15 cm) away from your side. Keeping your arm on the table, lean your body forward. Abduction  Turn your chair so that your affected side is nearest to the table. Place your forearm and hand on the table so that your thumb points toward the ceiling and your arm is straight out to your side. Slide your hand out to the side and  away from you. To increase the stretch, you can slide your chair away from the table. Flexion: forward stretch  Sit facing the table. Place your hand and elbow on the table in front of you. Slide your hand forward and away from you, using your unaffected arm to do the work. To increase the stretch, you can slide your chair backward. Phase 3 exercises Do these exercises 1-2 times a day or as told by your health care provider. Hold each stretch for 30 seconds, and repeat 3 times. Do the exercises with one or both arms as instructed by your health care provider. You will need a cane, a piece of PVC pipe, or a sturdy wooden dowel for the wand exercises. Cross-body stretch: posterior capsule stretch  Lift your arm straight out in front of you. Bend your arm in a 90-degree angle (right angle) at the elbow so your forearm moves across your body. Use your other arm to gently pull the elbow  across your body, toward your other shoulder. Wall climbs  Stand with your affected arm extended out to the side with your hand resting on a door frame. Slide your hand slowly up the door frame. To increase the stretch, step through the door frame. Keep your body upright and do not lean. Flexion     To do this exercise while standing: Hold the wand with both of your hands, palms-down. Lift the wand up and over your head, if able. Lift mostly with your affected arm, and use the other arm to help. Push upward with your other arm to gently increase the stretch. To do this exercise while lying down: Lie on your back with your elbows resting on the floor and the wand in both your hands. Your hands will be palm-down, or pointing toward your feet. Lift your hands toward the ceiling, using your unaffected arm to help if needed. Bring your arms overhead as able, using your unaffected arm to help if needed.  Internal rotation  Stand while holding the wand behind you with both hands. Your unaffected arm should be extended above your head with the arm of the affected side extended behind you at the level of your waist. The wand should be pointing straight up and down as you hold it. Slowly pull the wand up behind your back by straightening the elbow of your unaffected arm and bending the elbow of your affected arm. External rotation  Lie on your back with your affected upper arm supported on a small pillow or rolled towel. When you first do this exercise, keep your upper arm close to your body. Over time, bring your arm up to a 90-degree angle (right angle) out to the side. Hold the wand across your stomach and with both hands palm-up. Your elbow on your affected side should be bent at a 90-degree angle. Use your unaffected side to help push your forearm away from you and toward the floor. Keep your elbow on your affected side bent at a 90-degree angle. This information is not intended to replace  advice given to you by your health care provider. Make sure you discuss any questions you have with your health care provider. Document Revised: 11/13/2021 Document Reviewed: 11/13/2021 Elsevier Patient Education  [place].   Joint Pain  Joint pain can be caused by many things. It is likely to go away if you follow instructions from your doctor for taking care of yourself at home. Sometimes, you may need more treatment. Follow these  instructions at home: Managing pain, stiffness, and swelling     If told, put ice on the painful area. To do this: If you have a removable elastic bandage, sling, or splint, take it off as told by your doctor. Put ice in a plastic bag. Place a towel between your skin and the bag. Leave the ice on for 20 minutes, 2-3 times a day. Take off the ice if your skin turns bright red. This is very important. If you cannot feel pain, heat, or cold, you have a greater risk of damage to the area. Move your fingers or toes below the painful joint often. Raise the painful joint above the level of your heart while you are sitting or lying down. If told, put heat on the painful area. Do this as often as told by your doctor. Use the heat source that your doctor recommends, such as a moist heat pack or a heating pad. Place a towel between your skin and the heat source. Leave the heat on for 20-30 minutes. Take off the heat if your skin gets bright red. This is especially important if you are unable to feel pain, heat, or cold. You may have a greater risk of getting burned. Activity Rest the painful joint for as long as told by your doctor. Do not do things that cause pain or make your pain worse. Begin exercising or stretching the affected area, as told by your doctor. Ask your doctor what types of exercise are safe for you. Return to your normal activities when your doctor says that it is safe. If you have an elastic bandage, sling, or splint: Wear it as told by  your doctor. Take it only as told by your doctor. Loosen it your fingers or toes below the joint: Tingle. Become numb. Get cold and blue. Keep it clean. Ask your doctor if you should take it off before bathing. If it is not waterproof: Do not let it get wet. Cover it with a watertight covering when you take a bath or shower. General instructions Take over-the-counter and prescription medicines only as told by your doctor. This may include medicines taken by mouth or applied to the skin. Do not smoke or use any products that contain nicotine or tobacco. If you need help quitting, ask your doctor. Keep all follow-up visits as told by your doctor. This is important. Contact a doctor if: You have pain that gets worse and does not get better with medicine. Your joint pain does not get better in 3 days. You have more bruising or swelling. You have a fever. You lose 10 lb (4.5 kg) or more without trying. Get help right away if: You cannot move the joint. Your fingers or toes tingle, become numb. or get cold and blue. You have a fever along with a joint that is red, warm, and swollen. Summary Joint pain can be caused by many things. It often goes away if you follow instructions from your doctor for taking care of yourself at home. Rest the painful joint for as long as told. Do not do things that cause pain or make your pain worse. Take over-the-counter and prescription medicines only as told by your doctor. This information is not intended to replace advice given to you by your health care provider. Make sure you discuss any questions you have with your health care provider. Document Revised: 01/05/2020 Document Reviewed: 01/05/2020 Elsevier Patient Education  [place].

## 2022-07-19 LAB — COMPREHENSIVE METABOLIC PANEL
ALT: 21 IU/L (ref 0–32)
AST: 21 IU/L (ref 0–40)
Albumin/Globulin Ratio: 1.8 (ref 1.2–2.2)
Albumin: 4.2 g/dL (ref 3.8–4.8)
Alkaline Phosphatase: 54 IU/L (ref 44–121)
BUN/Creatinine Ratio: 17 (ref 12–28)
BUN: 17 mg/dL (ref 8–27)
Bilirubin Total: 0.4 mg/dL (ref 0.0–1.2)
CO2: 24 mmol/L (ref 20–29)
Calcium: 9.9 mg/dL (ref 8.7–10.3)
Chloride: 102 mmol/L (ref 96–106)
Creatinine, Ser: 0.98 mg/dL (ref 0.57–1.00)
Globulin, Total: 2.3 g/dL (ref 1.5–4.5)
Glucose: 121 mg/dL — ABNORMAL HIGH (ref 70–99)
Potassium: 4.3 mmol/L (ref 3.5–5.2)
Sodium: 140 mmol/L (ref 134–144)
Total Protein: 6.5 g/dL (ref 6.0–8.5)
eGFR: 59 mL/min/{1.73_m2} — ABNORMAL LOW (ref >59)

## 2022-07-19 LAB — HEMOGLOBIN A1C
Est. average glucose Bld gHb Est-mCnc: 146 mg/dL
Hgb A1c MFr Bld: 6.7 % — ABNORMAL HIGH (ref 4.8–5.6)

## 2022-07-19 LAB — CBC WITH DIFFERENTIAL/PLATELET
Basophils Absolute: 0.1 10*3/uL (ref 0.0–0.2)
Basos: 2 %
EOS (ABSOLUTE): 0.2 10*3/uL (ref 0.0–0.4)
Eos: 3 %
Hematocrit: 43.4 % (ref 34.0–46.6)
Hemoglobin: 14.4 g/dL (ref 11.1–15.9)
Immature Grans (Abs): 0 10*3/uL (ref 0.0–0.1)
Immature Granulocytes: 0 %
Lymphocytes Absolute: 3 10*3/uL (ref 0.7–3.1)
Lymphs: 44 %
MCH: 32.1 pg (ref 26.6–33.0)
MCHC: 33.2 g/dL (ref 31.5–35.7)
MCV: 97 fL (ref 79–97)
Monocytes Absolute: 0.4 10*3/uL (ref 0.1–0.9)
Monocytes: 6 %
Neutrophils Absolute: 3.1 10*3/uL (ref 1.4–7.0)
Neutrophils: 45 %
Platelets: 261 10*3/uL (ref 150–450)
RBC: 4.49 x10E6/uL (ref 3.77–5.28)
RDW: 12.6 % (ref 11.7–15.4)
WBC: 6.8 10*3/uL (ref 3.4–10.8)

## 2022-07-19 LAB — CARDIOVASCULAR RISK ASSESSMENT

## 2022-07-19 LAB — LIPID PANEL
Chol/HDL Ratio: 3.4 ratio (ref 0.0–4.4)
Cholesterol, Total: 155 mg/dL (ref 100–199)
HDL: 45 mg/dL (ref >39)
LDL Chol Calc (NIH): 81 mg/dL (ref 0–99)
Triglycerides: 167 mg/dL — ABNORMAL HIGH (ref 0–149)
VLDL Cholesterol Cal: 29 mg/dL (ref 5–40)

## 2022-07-29 ENCOUNTER — Encounter: Payer: Self-pay | Admitting: Nurse Practitioner

## 2022-08-08 DIAGNOSIS — H52223 Regular astigmatism, bilateral: Secondary | ICD-10-CM | POA: Diagnosis not present

## 2022-08-08 DIAGNOSIS — H5201 Hypermetropia, right eye: Secondary | ICD-10-CM | POA: Diagnosis not present

## 2022-08-08 DIAGNOSIS — Z961 Presence of intraocular lens: Secondary | ICD-10-CM | POA: Diagnosis not present

## 2022-08-08 DIAGNOSIS — Z9849 Cataract extraction status, unspecified eye: Secondary | ICD-10-CM | POA: Diagnosis not present

## 2022-08-08 DIAGNOSIS — H524 Presbyopia: Secondary | ICD-10-CM | POA: Diagnosis not present

## 2022-08-08 DIAGNOSIS — E119 Type 2 diabetes mellitus without complications: Secondary | ICD-10-CM | POA: Diagnosis not present

## 2022-08-23 DIAGNOSIS — K5130 Ulcerative (chronic) rectosigmoiditis without complications: Secondary | ICD-10-CM | POA: Diagnosis not present

## 2022-10-23 ENCOUNTER — Ambulatory Visit (INDEPENDENT_AMBULATORY_CARE_PROVIDER_SITE_OTHER): Payer: 59 | Admitting: Nurse Practitioner

## 2022-10-23 ENCOUNTER — Encounter: Payer: Self-pay | Admitting: Nurse Practitioner

## 2022-10-23 ENCOUNTER — Other Ambulatory Visit: Payer: Self-pay | Admitting: Nurse Practitioner

## 2022-10-23 VITALS — BP 122/80 | HR 76 | Temp 97.3°F | Ht 64.0 in | Wt 204.0 lb

## 2022-10-23 DIAGNOSIS — M810 Age-related osteoporosis without current pathological fracture: Secondary | ICD-10-CM

## 2022-10-23 DIAGNOSIS — E1169 Type 2 diabetes mellitus with other specified complication: Secondary | ICD-10-CM | POA: Diagnosis not present

## 2022-10-23 DIAGNOSIS — E782 Mixed hyperlipidemia: Secondary | ICD-10-CM

## 2022-10-23 DIAGNOSIS — K219 Gastro-esophageal reflux disease without esophagitis: Secondary | ICD-10-CM | POA: Diagnosis not present

## 2022-10-23 DIAGNOSIS — I10 Essential (primary) hypertension: Secondary | ICD-10-CM | POA: Diagnosis not present

## 2022-10-23 MED ORDER — ALENDRONATE SODIUM 70 MG PO TABS: 70.0000 mg | ORAL_TABLET | ORAL | 2 refills | Status: DC

## 2022-10-23 NOTE — Patient Instructions (Addendum)
We will call you with lab results Continue medications Obtain Prevnar 20 pneumonia vaccine at [facility] Follow-up in 41-month, fasting  Pneumococcal Conjugate Vaccine (PCV20) Injection What is this medication? PNEUMOCOCCAL CONJUGATE VACCINE (NEU mo KOK al kon ju gate vak SEEN) reduces the risk of pneumococcal disease, such as pneumonia. It does not treat pneumococcal disease. It is still possible to get pneumococcal disease after receiving this vaccine, but the symptoms may be less severe or not last as long. It works by helping your immune system learn how to fight off a future infection. This medicine may be used for other purposes; ask your health care provider or pharmacist if you have questions. COMMON BRAND NAME(S): Prevnar 20 What should I tell my care team before I take this medication? They need to know if you have any of these conditions: Bleeding disorder Fever Immune system problems An unusual or allergic reaction to pneumococcal vaccine, diphtheria toxoid, other vaccines, other medications, foods, dyes, or preservatives Pregnant or trying to get pregnant Breastfeeding How should I use this medication? This vaccine is injected into a muscle. It is given by your care team. A copy of Vaccine Information Statements will be given before each vaccination. Be sure to read this information carefully each time. This sheet may change often. Talk to your care team about the use of this medication in children. While it may be given to children as young as 6 weeks for selected conditions, precautions do apply. Overdosage: If you think you have taken too much of this medicine contact a poison control center or emergency room at once. NOTE: This medicine is only for you. Do not share this medicine with others. What if I miss a dose? This does not apply. This medication is not for regular use. What may interact with this medication? Medications for cancer chemotherapy Medications that  suppress your immune function Steroid medications, such as prednisone or cortisone This list may not describe all possible interactions. Give your health care provider a list of all the medicines, herbs, non-prescription drugs, or dietary supplements you use. Also tell them if you smoke, drink alcohol, or use illegal drugs. Some items may interact with your medicine. What should I watch for while using this medication? Visit your care team regularly. Report any side effects to your care team right away. This vaccine, like all vaccines, may not fully protect everyone. What side effects may I notice from receiving this medication? Side effects that you should report to your care team as soon as possible: Allergic reactions--skin rash, itching, hives, swelling of the face, lips, tongue, or throat Side effects that usually do not require medical attention (report these to your care team if they continue or are bothersome): Fatigue Fever Headache Joint pain Muscle pain Pain, redness, or irritation at injection site This list may not describe all possible side effects. Call your doctor for medical advice about side effects. You may report side effects to FDA at 1-800-FDA-1088. Where should I keep my medication? This vaccine is only given by your care team. It will not be stored at home. NOTE: This sheet is a summary. It may not cover all possible information. If you have questions about this medicine, talk to your doctor, pharmacist, or health care provider.  2023 Elsevier/Gold Standard (2022-03-05 00:00:00)  Bone Health Information for Aging Adults Your bones do more than support your body. They also store calcium. The inside of your bones (marrow) makes blood cells. Maintaining bone health becomes more important as  you age because your bones replace all their cells about every 10 years. Around age 21, it gets harder to replace those cells, and bones can become weak. Weak bones can lead to  osteoporosis and breaks (fractures). Falls also become more likely, which can cause fractures. The good news is that, with diet and exercise, you can improve and maintain your bone health at any age. How to eat for bone health A balanced diet can supply many of the vitamins, minerals, and proteins you need for bone health. Older adults need to make sure they get enough calcium, vitamin D, and magnesium. You may need more of these as you age. Calcium  Calcium is the most important (essential) mineral for bone health. The daily requirement for calcium for adult men aged 60-70 years is 1,000 mg. For adult women aged 26-70 years, it is 1,200 mg. At age 36 or older, it is 1,200 mg for both men and women. Sources of calcium in your diet include: Dairy foods like milk, yogurt, and cheese. Dairy foods are the best sources. If you cannot eat dairy, you may need a calcium supplement. Leafy, dark green vegetables. These include collard greens, kale, broccoli, bok choy, and okra. Fatty fish, like sardines and canned salmon with bones. Almonds. Tofu.  Vitamin D You need vitamin D for bone health because it helps your body absorb calcium from your diet. It is not found naturally in many foods. Many people can benefit from taking a supplement. The daily requirement for vitamin D at age 49 or older is 600-1,000 international units (IU). Dietary sources for vitamin D include: Fatty fish, such as swordfish, salmon, sardine, and mackerel. Foods that have vitamin D added to them (are fortified), like cereal and dairy products. Egg yolks. Magnesium Magnesium helps your body use both calcium and vitamin D. The recommended daily intake for adult men is 400-420 mg. For adult women, it is 310-320 mg. Dietary sources of magnesium include: Green vegetables, such as collard greens, kale, bok choy, and okra. Poppy, sesame, and chia seeds. Legumes, including peas and beans. Whole grains. Avocados. Nuts. If you drink  alcohol regularly or take a type of antacid called a proton pump inhibitor, you might benefit from a magnesium supplement. How to exercise for bone health Exercise is important for bone health because it strengthens bones and muscles. Bones are living organs that get stronger when you exercise them, just like your heart and other muscles. Muscles support your bones and protect your joints. Strong muscles also help prevent bone loss and falls. Weight-bearing exercises and resistance exercises are the two types of exercise that are most important for bone health. Weight-bearing exercises include running or walking, climbing stairs, and playing sports like tennis. Resistance exercises include those done using free weights, weight machines, or resistance bands. Try to get at least 30 minutes of exercise every day. You can also include stretching, balance, and flexibility exercises, like yoga or tai chi. These lower your risk of falls. Follow these instructions at home Ask your health care provider if: You need a bone density test. This is especially important for women older than 94 and men older than 70. You have been losing height. Loss of height may be a sign of weakening bones in your spine. Any of your medicines or medical conditions could affect your bone health. He or she could recommend an exercise program that is safe for you. Be sure it includes both weight-bearing and resistance exercises. Do not use any  products that contain nicotine or tobacco. These products include cigarettes, chewing tobacco, and vaping devices, such as e-cigarettes. These can reduce bone density. If you need help quitting, ask your health care provider. Drink alcohol in moderation. Alcohol reduces bone density and increases your risk for falls. If you drink alcohol: Limit how much you have to: 0-1 drink a day for women who are not pregnant. 0-2 drinks a day for men. Know how much alcohol is in a drink. In the [place] one  drink equals one 12 oz bottle of beer (355 mL), one 5 oz glass of wine (148 mL), or one 1 oz glass of hard liquor (44 mL). Take over-the-counter and prescription medicines only as told by your health care provider. Ask your health care provider if you could benefit from taking calcium, vitamin D, or magnesium supplements. Keep all follow-up visits. This is important. Where to find more information American Bone Health: CardKnowledge.fi American Academy of Orthopaedic Surgeons: orthoinfo.[place]: bones.SouthExposed.es Summary Maintaining your bone health becomes more important as you age. You can improve and maintain your bone health with diet and exercise at any age. A balanced diet can supply many of the vitamins, minerals, and proteins you need for bone health. Older adults need to make sure they get enough calcium, vitamin D, and magnesium. Ask your health care provider if you could benefit from taking calcium, vitamin D, or magnesium supplements. Exercise is important for bone health because it strengthens bones and muscles. Do both weight-bearing and resistance exercises. This information is not intended to replace advice given to you by your health care provider. Make sure you discuss any questions you have with your health care provider. Document Revised: 03/07/2021 Document Reviewed: 03/07/2021 Elsevier Patient Education  [place].

## 2022-10-23 NOTE — Progress Notes (Signed)
Subjective:  Patient ID: Diane Chang, female    DOB: 08-19-1941  Age: 82 y.o. MRN: [id number]  No chief complaint on file.   Diabetes Pertinent negatives for hypoglycemia include no dizziness, headaches or nervousness/anxiousness. Pertinent negatives for diabetes include no chest pain, no fatigue and no weakness.  Hyperlipidemia Pertinent negatives include no chest pain, myalgias or shortness of breath.  Knee Pain       Pt presents for follow-up for T2DM, HTN, and hyperlipidemia. Recently lost her job at [employer]. She lost her insurance temporarily. She was unable to have mammogram or DEXA scan due to insurance lapse. She has requested to reschedule tests at [facility]. She reports chronic right shoulder pain and stiffness. Symptoms are worse in the morning and during cold weather. She denies previous shoulder injury.   Diabetes Mellitus Type II, Follow-up  Lab Results  Component Value Date   HGBA1C 6.7 (H) 07/18/2022   HGBA1C 6.5 (H) 04/16/2022   HGBA1C 6.7 (H) 12/12/2021   Wt Readings from Last 3 Encounters:  10/23/22 204 lb (92.5 kg)  07/18/22 202 lb (91.6 kg)  06/04/22 198 lb 9.6 oz (90.1 kg)   Last seen for diabetes 3 months ago.  Management  includes ozempic and metformin. She reports excellent compliance with treatment. She is not having side effects.   Home blood sugar records:  daily, today was 120 Most Recent Eye Exam: UTD  Pertinent Labs: Lab Results  Component Value Date   CHOL 155 07/18/2022   HDL 45 07/18/2022   LDLCALC 81 07/18/2022   TRIG 167 (H) 07/18/2022   CHOLHDL 3.4 07/18/2022   Lab Results  Component Value Date   NA 140 07/18/2022   K 4.3 07/18/2022   CREATININE 0.98 07/18/2022   EGFR 59 (L) 07/18/2022   GFRNONAA 63 08/18/2020   GLUCOSE 121 (H) 07/18/2022     BP at that visit was 128/70. Management includes lisinopril-HCTZ.  She reports excellent compliance with treatment. She is not having side effects.  She  is following a Regular diet. She is not exercising. She does not smoke.   Pertinent labs: Lab Results  Component Value Date   CHOL 155 07/18/2022   HDL 45 07/18/2022   LDLCALC 81 07/18/2022   TRIG 167 (H) 07/18/2022   CHOLHDL 3.4 07/18/2022   Lab Results  Component Value Date   NA 140 07/18/2022   K 4.3 07/18/2022   CREATININE 0.98 07/18/2022   EGFR 59 (L) 07/18/2022   GFRNONAA 63 08/18/2020   GLUCOSE 121 (H) 07/18/2022     Lipid/Cholesterol, Follow-up  Last lipid panel Other pertinent labs  Lab Results  Component Value Date   CHOL 155 07/18/2022   HDL 45 07/18/2022   LDLCALC 81 07/18/2022   TRIG 167 (H) 07/18/2022   CHOLHDL 3.4 07/18/2022   Lab Results  Component Value Date   ALT 21 07/18/2022   AST 21 07/18/2022   PLT 261 07/18/2022   TSH 1.660 08/13/2021      Management includes lipitor.  She reports excellent compliance with treatment. She is not having side effects.   GERD, Follow up:  Current treatment consist of: omeprazole  She reports excellent compliance with treatment. She is not having side effects. .   She is NOT experiencing belching and eructation    Current Outpatient Medications on File Prior to Visit  Medication Sig Dispense Refill   alendronate (FOSAMAX) 70 MG tablet Take 1 tablet (70 mg total) by mouth once a  week. Take with a full glass of water on an empty stomach. 12 tablet 2   aspirin EC 81 MG tablet Take 81 mg by mouth daily. Swallow whole.     atorvastatin (LIPITOR) 40 MG tablet Take 1 tablet (40 mg total) by mouth daily. 90 tablet 2   diphenoxylate-atropine (LOMOTIL) 2.5-0.025 MG tablet Take 1 tablet by mouth 4 (four) times daily as needed for diarrhea or loose stools. 30 tablet 0   lisinopril-hydrochlorothiazide (ZESTORETIC) 20-12.5 MG tablet Take 1 tablet by mouth daily. 90 tablet 2   meclizine (ANTIVERT) 25 MG tablet Take 1 tablet (25 mg total) by mouth 3 (three) times daily as needed. 30 tablet 2   meloxicam (MOBIC) 15  MG tablet Take 1 tablet (15 mg total) by mouth daily. 90 tablet 1   mesalamine (ROWASA) 4 g enema SMARTSIG:60 Milliliter(s) Rectally Every Night     metFORMIN (GLUCOPHAGE) 500 MG tablet TAKE 1 TABLET BY MOUTH EVERY DAY WITH BREAKFAST 90 tablet 2   omeprazole (PRILOSEC) 40 MG capsule TAKE 1 CAPSULE BY MOUTH TWICE A DAY BEFORE A MEAL 60 capsule 6   Semaglutide,0.25 or 0.'5MG'$ /DOS, (OZEMPIC, 0.25 OR 0.5 MG/DOSE,) 2 MG/3ML SOPN Inject 0.5 mg into the skin once a week. 9 mL 1   No current facility-administered medications on file prior to visit.   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   Past Surgical History:  Procedure Laterality Date   CHOLECYSTECTOMY      Family History  Problem Relation Age of Onset   Heart attack Sister    Social History   Socioeconomic History   Marital status: Married    Spouse name: Not on file   Number of children: 1   Years of education: Not on file   Highest education level: Not on file  Occupational History   Occupation: packing  Tobacco Use   Smoking status: Former    Types: Cigarettes    Quit date: 1972    Years since quitting: 52.0   Smokeless tobacco: Never  Substance and Sexual Activity   Alcohol use: Never   Drug use: Never   Sexual activity: Not Currently  Other Topics Concern   Not on file  Social History Narrative   Not on file   Social Determinants of Health   Financial Resource  Strain: Low Risk  (06/28/2022)   Overall Financial Resource Strain (CARDIA)    Difficulty of Paying Living Expenses: Not hard at all  Food Insecurity: No Food Insecurity (04/16/2022)   Hunger Vital Sign    Worried About Running Out of Food in the Last Year: Never true    Ran Out of Food in the Last Year: Never true  Transportation Needs: No Transportation Needs (06/28/2022)   PRAPARE - Hydrologist (Medical): No    Lack of Transportation (Non-Medical): No  Physical Activity: Inactive (04/16/2022)   Exercise Vital Sign    Days of Exercise per Week: 0 days  Minutes of Exercise per Session: 0 min  Stress: No Stress Concern Present (04/16/2022)   [place]    Feeling of Stress : Not at all  Social Connections: Socially Isolated (04/16/2022)   Social Connection and Isolation Panel [NHANES]    Frequency of Communication with Friends and Family: More than three times a week    Frequency of Social Gatherings with Friends and Family: More than three times a week    Attends Religious Services: Never    Marine scientist or Organizations: No    Attends Archivist Meetings: Never    Marital Status: Divorced    Review of Systems  Constitutional:  Negative for chills, fatigue and fever.  HENT:  Negative for congestion, ear pain, rhinorrhea and sore throat.   Respiratory:  Negative for cough and shortness of breath.   Cardiovascular:  Negative for chest pain.  Gastrointestinal:  Negative for abdominal pain, constipation, diarrhea, nausea and vomiting.  Genitourinary:  Negative for dysuria and urgency.  Musculoskeletal:  Negative for back pain and myalgias.  Neurological:  Negative for dizziness, weakness, light-headedness and headaches.  Psychiatric/Behavioral:  Negative for dysphoric mood. The patient is not nervous/anxious.      Objective:  LMP  (LMP Unknown)      07/18/2022     7:58 AM 06/04/2022    2:44 PM 04/16/2022    8:38 AM  BP/Weight  Systolic BP 662 947 654  Diastolic BP 70 74 64  Wt. (Lbs) 202 198.6 182  BMI 32.6 kg/m2 33.05 kg/m2 30.29 kg/m2    Physical Exam  Diabetic Foot Exam - Simple   No data filed      Lab Results  Component Value Date   WBC 6.8 07/18/2022   HGB 14.4 07/18/2022   HCT 43.4 07/18/2022   PLT 261 07/18/2022   GLUCOSE 121 (H) 07/18/2022   CHOL 155 07/18/2022   TRIG 167 (H) 07/18/2022   HDL 45 07/18/2022   LDLCALC 81 07/18/2022   ALT 21 07/18/2022   AST 21 07/18/2022   NA 140 07/18/2022   K 4.3 07/18/2022   CL 102 07/18/2022   CREATININE 0.98 07/18/2022   BUN 17 07/18/2022   CO2 24 07/18/2022   TSH 1.660 08/13/2021   HGBA1C 6.7 (H) 07/18/2022   MICROALBUR 30 12/15/2020      Assessment & Plan:   There are no diagnoses linked to this encounter.   No orders of the defined types were placed in this encounter.   No orders of the defined types were placed in this encounter.    Follow-up: No follow-ups on file.  An After Visit Summary was printed and given to the patient.  Rip Harbour, NP [place] [phone]
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
## 2022-10-24 LAB — CBC WITH DIFFERENTIAL/PLATELET
Basophils Absolute: 0.1 10*3/uL (ref 0.0–0.2)
Basos: 1 %
EOS (ABSOLUTE): 0.2 10*3/uL (ref 0.0–0.4)
Eos: 3 %
Hematocrit: 44.7 % (ref 34.0–46.6)
Hemoglobin: 15.2 g/dL (ref 11.1–15.9)
Immature Grans (Abs): 0 10*3/uL (ref 0.0–0.1)
Immature Granulocytes: 0 %
Lymphocytes Absolute: 2.5 10*3/uL (ref 0.7–3.1)
Lymphs: 42 %
MCH: 33.3 pg — ABNORMAL HIGH (ref 26.6–33.0)
MCHC: 34 g/dL (ref 31.5–35.7)
MCV: 98 fL — ABNORMAL HIGH (ref 79–97)
Monocytes Absolute: 0.3 10*3/uL (ref 0.1–0.9)
Monocytes: 6 %
Neutrophils Absolute: 2.8 10*3/uL (ref 1.4–7.0)
Neutrophils: 48 %
Platelets: 250 10*3/uL (ref 150–450)
RBC: 4.57 x10E6/uL (ref 3.77–5.28)
RDW: 12.3 % (ref 11.7–15.4)
WBC: 5.9 10*3/uL (ref 3.4–10.8)

## 2022-10-24 LAB — LIPID PANEL
Chol/HDL Ratio: 3.9 ratio (ref 0.0–4.4)
Cholesterol, Total: 162 mg/dL (ref 100–199)
HDL: 42 mg/dL (ref >39)
LDL Chol Calc (NIH): 93 mg/dL (ref 0–99)
Triglycerides: 155 mg/dL — ABNORMAL HIGH (ref 0–149)
VLDL Cholesterol Cal: 27 mg/dL (ref 5–40)

## 2022-10-24 LAB — TSH: TSH: 2.03 u[IU]/mL (ref 0.450–4.500)

## 2022-10-24 LAB — T4, FREE: Free T4: 1.29 ng/dL (ref 0.82–1.77)

## 2022-10-24 LAB — COMPREHENSIVE METABOLIC PANEL
ALT: 27 IU/L (ref 0–32)
AST: 29 IU/L (ref 0–40)
Albumin/Globulin Ratio: 1.4 (ref 1.2–2.2)
Albumin: 4.2 g/dL (ref 3.8–4.8)
Alkaline Phosphatase: 54 IU/L (ref 44–121)
BUN/Creatinine Ratio: 18 (ref 12–28)
BUN: 18 mg/dL (ref 8–27)
Bilirubin Total: 0.5 mg/dL (ref 0.0–1.2)
CO2: 24 mmol/L (ref 20–29)
Calcium: 10.2 mg/dL (ref 8.7–10.3)
Chloride: 102 mmol/L (ref 96–106)
Creatinine, Ser: 0.98 mg/dL (ref 0.57–1.00)
Globulin, Total: 2.9 g/dL (ref 1.5–4.5)
Glucose: 136 mg/dL — ABNORMAL HIGH (ref 70–99)
Potassium: 4.2 mmol/L (ref 3.5–5.2)
Sodium: 140 mmol/L (ref 134–144)
Total Protein: 7.1 g/dL (ref 6.0–8.5)
eGFR: 59 mL/min/{1.73_m2} — ABNORMAL LOW (ref >59)

## 2022-10-24 LAB — VITAMIN D 25 HYDROXY (VIT D DEFICIENCY, FRACTURES): Vit D, 25-Hydroxy: 11.6 ng/mL — ABNORMAL LOW (ref 30.0–100.0)

## 2022-10-24 LAB — CARDIOVASCULAR RISK ASSESSMENT

## 2022-10-24 LAB — HEMOGLOBIN A1C
Est. average glucose Bld gHb Est-mCnc: 143 mg/dL
Hgb A1c MFr Bld: 6.6 % — ABNORMAL HIGH (ref 4.8–5.6)

## 2022-10-25 DIAGNOSIS — Z1231 Encounter for screening mammogram for malignant neoplasm of breast: Secondary | ICD-10-CM | POA: Diagnosis not present

## 2022-10-25 DIAGNOSIS — M85852 Other specified disorders of bone density and structure, left thigh: Secondary | ICD-10-CM | POA: Diagnosis not present

## 2022-10-29 ENCOUNTER — Other Ambulatory Visit: Payer: Self-pay

## 2022-10-29 DIAGNOSIS — Z1231 Encounter for screening mammogram for malignant neoplasm of breast: Secondary | ICD-10-CM

## 2022-10-29 LAB — MM DIGITAL SCREENING BILATERAL

## 2022-11-04 ENCOUNTER — Other Ambulatory Visit: Payer: Self-pay

## 2022-11-04 MED ORDER — VITAMIN D (ERGOCALCIFEROL) 1.25 MG (50000 UNIT) PO CAPS: 50000.0000 [IU] | ORAL_CAPSULE | ORAL | 2 refills | Status: DC

## 2022-11-05 ENCOUNTER — Other Ambulatory Visit: Payer: Self-pay

## 2022-11-05 DIAGNOSIS — Z780 Asymptomatic menopausal state: Secondary | ICD-10-CM

## 2022-11-19 ENCOUNTER — Other Ambulatory Visit: Payer: Self-pay

## 2022-11-19 DIAGNOSIS — E782 Mixed hyperlipidemia: Secondary | ICD-10-CM

## 2022-11-19 MED ORDER — ATORVASTATIN CALCIUM 40 MG PO TABS: 40.0000 mg | ORAL_TABLET | Freq: Every day | ORAL | 0 refills | Status: DC

## 2022-11-19 NOTE — Telephone Encounter (Signed)
[employer] representative called for Mrs. Diane Chang.  Her plan will cover her statin medication at no charge if she is given #100.  Lipitor 40 mg #100 sent to [facility].

## 2023-01-28 ENCOUNTER — Ambulatory Visit (INDEPENDENT_AMBULATORY_CARE_PROVIDER_SITE_OTHER): Payer: 59

## 2023-01-28 DIAGNOSIS — H6123 Impacted cerumen, bilateral: Secondary | ICD-10-CM | POA: Diagnosis not present

## 2023-01-28 NOTE — Progress Notes (Signed)
Flushed and cleaned bilateral ears for patient and patient tolerated well.

## 2023-02-03 ENCOUNTER — Other Ambulatory Visit: Payer: Self-pay

## 2023-02-03 DIAGNOSIS — K2100 Gastro-esophageal reflux disease with esophagitis, without bleeding: Secondary | ICD-10-CM

## 2023-02-03 MED ORDER — OMEPRAZOLE 40 MG PO CPDR: DELAYED_RELEASE_CAPSULE | ORAL | 1 refills | Status: DC

## 2023-02-13 DIAGNOSIS — J0100 Acute maxillary sinusitis, unspecified: Secondary | ICD-10-CM | POA: Diagnosis not present

## 2023-02-13 DIAGNOSIS — R051 Acute cough: Secondary | ICD-10-CM | POA: Diagnosis not present

## 2023-02-13 DIAGNOSIS — H5713 Ocular pain, bilateral: Secondary | ICD-10-CM | POA: Diagnosis not present

## 2023-02-13 DIAGNOSIS — R0982 Postnasal drip: Secondary | ICD-10-CM | POA: Diagnosis not present

## 2023-02-21 ENCOUNTER — Ambulatory Visit (INDEPENDENT_AMBULATORY_CARE_PROVIDER_SITE_OTHER): Payer: 59 | Admitting: Physician Assistant

## 2023-02-21 VITALS — BP 120/70 | HR 60 | Temp 96.0°F | Ht 64.0 in | Wt 206.0 lb

## 2023-02-21 DIAGNOSIS — M810 Age-related osteoporosis without current pathological fracture: Secondary | ICD-10-CM | POA: Diagnosis not present

## 2023-02-21 DIAGNOSIS — Z7984 Long term (current) use of oral hypoglycemic drugs: Secondary | ICD-10-CM

## 2023-02-21 DIAGNOSIS — Z1211 Encounter for screening for malignant neoplasm of colon: Secondary | ICD-10-CM

## 2023-02-21 DIAGNOSIS — I10 Essential (primary) hypertension: Secondary | ICD-10-CM

## 2023-02-21 DIAGNOSIS — I152 Hypertension secondary to endocrine disorders: Secondary | ICD-10-CM

## 2023-02-21 DIAGNOSIS — M19011 Primary osteoarthritis, right shoulder: Secondary | ICD-10-CM | POA: Diagnosis not present

## 2023-02-21 DIAGNOSIS — K219 Gastro-esophageal reflux disease without esophagitis: Secondary | ICD-10-CM

## 2023-02-21 DIAGNOSIS — E1169 Type 2 diabetes mellitus with other specified complication: Secondary | ICD-10-CM | POA: Diagnosis not present

## 2023-02-21 DIAGNOSIS — F322 Major depressive disorder, single episode, severe without psychotic features: Secondary | ICD-10-CM

## 2023-02-21 DIAGNOSIS — E1159 Type 2 diabetes mellitus with other circulatory complications: Secondary | ICD-10-CM | POA: Diagnosis not present

## 2023-02-21 DIAGNOSIS — E559 Vitamin D deficiency, unspecified: Secondary | ICD-10-CM

## 2023-02-21 DIAGNOSIS — E782 Mixed hyperlipidemia: Secondary | ICD-10-CM | POA: Diagnosis not present

## 2023-02-21 DIAGNOSIS — Z7985 Long-term (current) use of injectable non-insulin antidiabetic drugs: Secondary | ICD-10-CM | POA: Diagnosis not present

## 2023-02-21 DIAGNOSIS — E669 Obesity, unspecified: Secondary | ICD-10-CM

## 2023-02-21 DIAGNOSIS — Z1159 Encounter for screening for other viral diseases: Secondary | ICD-10-CM

## 2023-02-21 MED ORDER — VITAMIN D3 250 MCG (10000 UT) PO CAPS: 10000.0000 [IU] | ORAL_CAPSULE | Freq: Every day | ORAL | 2 refills | Status: AC

## 2023-02-21 NOTE — Assessment & Plan Note (Signed)
Well controlled.  Continue to work on eating a healthy diet and exercise.  Labs drawn today.   No major side effects reported, and no issues with compliance. The current medical regimen is effective;  continue present plan with Fosamax 70mg , Vitamin D 10,000 units

## 2023-02-21 NOTE — Progress Notes (Addendum)
Subjective:  Patient ID: Diane Chang, female    DOB: Apr 11, 1941  Age: 82 y.o. MRN: [id number]  Chief Complaint  Patient presents with   Medical Management of Chronic Issues    HPI  Diane Chang. [name] is a 82 year old female here for her follow up for diabetes, hypertension, hyperlipidemia, and GERD. Patient states she is doing good today with no complains. She did get hearing aids both ears last week.  She also got dentures last week.  States that both the dentures and hearing aids are working very well for her and she is very excited about them.  Right shoulder pain: Patient states that she has had this pain for over a year now.  That will come and go and some days will be better than others.  States that she has been told it is arthritis but she just wants to make sure and actually figure out what is causing the pain.  Discussed with the patient about starting with an x-ray.  That it can show [place] if there is arthritis in there if it is pinching on anything or could be causing her pain.  Discussed with her the possibility of also referring to an orthopedic surgeon to look at shaving away some of the arthritis or helping if the rotator cuff is compromised.  Diabetes:  Complications: Glucose checking:Every Morning Glucose logs: 100 - 120 fasting Most recent A1C: 6.6 Current medications: Ozempic 0.5 mg every Sunday, metformin 1000 mg daily Last Eye Exam: Patient has appointment 05/2023 Foot checks: 02/21/2023  Hyperlipidemia: Current medications: Lipitor 40 mg daily  Hypertension: Current medications: Lisinopril/HCTZ 20-12.5 mg daily  GERD: Omeprazole 40 mg daily.  Osteoporosis: Fosamax 70 mg weekly  Vitamin D deficiency: Patient is currently not taking medication.  Diet: regular  Exercise: keeping the house up, and walking at stores.     02/21/2023    8:42 AM 10/23/2022    8:04 AM 10/23/2022    8:02 AM 07/18/2022    8:02 AM 04/16/2022    8:41 AM  Depression screen PHQ 2/9   Decreased Interest 0 0 0 0 0  Down, Depressed, Hopeless 0 0 0 0 0  PHQ - 2 Score 0 0 0 0 0  Altered sleeping 0 0  0 0  Tired, decreased energy 0 0  0 0  Change in appetite 0 0  0 0  Feeling bad or failure about yourself  0 0  0 0  Trouble concentrating 0 0  0 0  Moving slowly or fidgety/restless 0 0  0 0  Suicidal thoughts 0 0  0 0  PHQ-9 Score 0 0  0 0  Difficult doing work/chores Not difficult at all Not difficult at all  Not difficult at all Not difficult at all        02/21/2023    8:42 AM  Fall Risk   Falls in the past year? 1  Number falls in past yr: 0  Injury with Fall? 0  Risk for fall due to : History of fall(s)  Follow up Falls evaluation completed    Patient Care Team: Langley Gauss, [place] as PCP - General (Physician Assistant) Zettie Pho, [facility] (Inactive) as Pharmacist (Pharmacist)   Review of Systems  Constitutional:  Negative for chills and fever.  HENT:  Negative for congestion and sore throat.   Respiratory:  Negative for cough.   Cardiovascular:  Negative for chest pain.  Gastrointestinal:  Negative for abdominal pain and nausea.  Musculoskeletal:  Negative for arthralgias.  Neurological:  Negative for dizziness and headaches.    Current Outpatient Medications on File Prior to Visit  Medication Sig Dispense Refill   alendronate (FOSAMAX) 70 MG tablet Take 1 tablet (70 mg total) by mouth once a week. Take with a full glass of water on an empty stomach. 12 tablet 2   aspirin EC 81 MG tablet Take 81 mg by mouth daily. Swallow whole.     atorvastatin (LIPITOR) 40 MG tablet Take 1 tablet (40 mg total) by mouth daily. 100 tablet 0   lisinopril-hydrochlorothiazide (ZESTORETIC) 20-12.5 MG tablet Take 1 tablet by mouth daily. 90 tablet 2   meloxicam (MOBIC) 15 MG tablet Take 1 tablet (15 mg total) by mouth daily. 90 tablet 1   metFORMIN (GLUCOPHAGE) 500 MG tablet TAKE 1 TABLET BY MOUTH EVERY DAY WITH BREAKFAST 90 tablet 2   Semaglutide,0.25 or 0.5MG /DOS,  (OZEMPIC, 0.25 OR 0.5 MG/DOSE,) 2 MG/3ML SOPN Inject 0.5 mg into the skin once a week. 9 mL 1   No current facility-administered medications on file prior to visit.   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   Past Surgical History:  Procedure Laterality Date   CHOLECYSTECTOMY      Family History  Problem Relation Age of Onset   Heart attack Sister    Social History   Socioeconomic History   Marital status: Married    Spouse name: Not on file   Number of children: 1   Years of education: Not on file   Highest education level: Not on file  Occupational History   Occupation: packing  Tobacco Use   Smoking status: Former    Types: Cigarettes    Quit date: 1972    Years since quitting: 52.5   Smokeless tobacco: Never  Substance and Sexual Activity   Alcohol use: Never   Drug use: Never   Sexual activity: Not Currently  Other Topics Concern   Not on file  Social History Narrative   Not on file   Social Determinants of Health   Financial Resource Strain: Low Risk  (06/28/2022)   Overall Financial Resource Strain (CARDIA)    Difficulty of Paying Living Expenses: Not hard at all  Food Insecurity: No Food Insecurity (04/16/2022)   Hunger Vital Sign    Worried About Running Out of Food in the Last Year: Never true    Ran Out of Food in the Last Year: Never true  Transportation Needs: No Transportation Needs (06/28/2022)    PRAPARE - Administrator, Civil Service (Medical): No    Lack of Transportation (Non-Medical): No  Physical Activity: Inactive (04/16/2022)   Exercise Vital Sign    Days of Exercise per Week: 0 days    Minutes of Exercise per Session: 0 min  Stress: No Stress Concern Present (04/16/2022)   [employer] of Occupational Health - Occupational Stress Questionnaire    Feeling of Stress : Not at all  Social Connections: Socially  Isolated (04/16/2022)   Social Connection and Isolation Panel [NHANES]    Frequency of Communication with Friends and Family: More than three times a week    Frequency of Social Gatherings with Friends and Family: More than three times a week    Attends Religious Services: Never    Database administrator or Organizations: No    Attends Engineer, structural: Never    Marital Status: Divorced    Objective:  BP 120/70 (BP Location: Right Arm, Patient Position: Sitting, Cuff Size: Large)   Pulse 60   Temp (!) 96 F (35.6 C) (Temporal)   Ht 5\' 4"  (1.626 m)   Wt 206 lb (93.4 kg)   LMP  (LMP Unknown)   SpO2 95%   BMI 35.36 kg/m      02/21/2023    8:32 AM 10/23/2022    7:59 AM 07/18/2022    7:58 AM  BP/Weight  Systolic BP 120 122 128  Diastolic BP 70 80 70  Wt. (Lbs) 206 204 202  BMI 35.36 kg/m2 35.02 kg/m2 32.6 kg/m2    Physical Exam Constitutional:      Appearance: Normal appearance.  Cardiovascular:     Rate and Rhythm: Normal rate and regular rhythm.     Heart sounds: Normal heart sounds.  Pulmonary:     Effort: Pulmonary effort is normal.     Breath sounds: Normal breath sounds.  Abdominal:     General: Bowel sounds are normal.     Palpations: Abdomen is soft.  Musculoskeletal:     Right shoulder: Tenderness present. Decreased range of motion. Decreased strength.     Left shoulder: Normal.  Neurological:     Mental Status: She is alert and oriented to person, place, and time.  Psychiatric:        Behavior: Behavior  normal.     Diabetic Foot Exam - Simple   Simple Foot Form Diabetic Foot exam was performed with the following findings: Yes 02/21/2023  8:44 AM  Visual Inspection No deformities, no ulcerations, no other skin breakdown bilaterally: Yes Sensation Testing Intact to touch and monofilament testing bilaterally: Yes Pulse Check Posterior Tibialis and Dorsalis pulse intact bilaterally: Yes Comments Patient does have some black color of both big toes.      Lab Results  Component Value Date   WBC 6.3 02/21/2023   HGB 14.6 02/21/2023   HCT 43.2 02/21/2023   PLT 288 02/21/2023   GLUCOSE 119 (H) 02/21/2023   CHOL 154 02/21/2023   TRIG 141 02/21/2023   HDL 40 02/21/2023   LDLCALC 89 02/21/2023   ALT 37 (H) 02/21/2023   AST 42 (H) 02/21/2023   NA 142 02/21/2023   K 4.4 02/21/2023   CL 104 02/21/2023   CREATININE 0.97 02/21/2023   BUN 11 02/21/2023   CO2 24 02/21/2023   TSH 2.030 10/23/2022   HGBA1C 6.7 (H) 02/21/2023   MICROALBUR 30 12/15/2020      Assessment & Plan:    Obesity, diabetes, and hypertension syndrome (HCC) Assessment & Plan: Well controlled.  Continue to work on eating a healthy diet and exercise.  Labs drawn today.   No major side effects reported, and no issues with compliance. The current medical regimen is effective;  continue present plan with Metformin 500mg , Ozempic .25/.5mg    Orders: -     CBC with Differential/Platelet -     Hemoglobin A1c -     Cardiovascular Risk Assessment  Benign hypertension Assessment & Plan: Well controlled.  Continue to work on eating a healthy diet and exercise.  Labs drawn today.   No major side effects reported, and no issues with compliance. The current medical regimen is effective;  continue present plan with Zestoretic 20-12.5mg , Aspirin 81mg   Orders: -     CMP14+EGFR  Mixed hyperlipidemia Assessment & Plan: Well controlled.  Continue to work on eating a healthy diet and exercise.  Labs drawn today.    No major side effects reported, and no issues with compliance. The current medical regimen is effective;  continue present plan with Lipitor 40mg   Orders: -     Lipid panel  Age-related osteoporosis without current pathological fracture Assessment & Plan: Well controlled.  Continue to work on eating a healthy diet and exercise.  Labs drawn today.   No major side effects reported, and no issues with compliance. The current medical regimen is effective;  continue present plan with Fosamax 70mg , Vitamin D 10,000 units  Orders: -     DG Shoulder Right; Future  Vitamin D deficiency -     VITAMIN D 25 Hydroxy (Vit-D Deficiency, Fractures) -     Vitamin D3; Take 1 capsule (10,000 Units total) by mouth daily.  Dispense: 30 capsule; Refill: 2  Need for hepatitis C screening test -     Hepatitis C antibody  Colon cancer screening -     Ambulatory referral to Gastroenterology  Gastroesophageal reflux disease, unspecified whether esophagitis present Assessment & Plan: Well controlled.  Continue to work on eating a healthy diet and exercise.  No major side effects reported, and no issues with compliance. The current medical regimen is effective;  continue present plan with Prilosec 40mg       Meds ordered this encounter  Medications   Cholecalciferol (VITAMIN D3) 250 MCG (10000 UT) capsule    Sig: Take 1 capsule (10,000 Units total) by mouth daily.    Dispense:  30 capsule    Refill:  2    Orders Placed This Encounter  Procedures   DG Shoulder Right   CBC with Differential/Platelet   Lipid panel   CMP14+EGFR   Hemoglobin A1c   Vitamin D, 25-hydroxy   Hepatitis C antibody   Cardiovascular Risk Assessment   Ambulatory referral to Gastroenterology     Follow-up: No follow-ups on file.   I,Jacqua L Marsh,acting as a scribe for [employer], PA.,have documented all relevant documentation on the behalf of Langley Gauss, PA,as directed by  Langley Gauss, PA while in the presence  of Langley Gauss, [place].   An After Visit Summary was printed and given to the patient.  Langley Gauss, [place] [facility] [phone]

## 2023-02-21 NOTE — Assessment & Plan Note (Addendum)
Well controlled.  Continue to work on eating a healthy diet and exercise.  No major side effects reported, and no issues with compliance. The current medical regimen is effective;  continue present plan with Prilosec 40mg 

## 2023-02-21 NOTE — Assessment & Plan Note (Signed)
Well controlled.  Continue to work on eating a healthy diet and exercise.  Labs drawn today.   No major side effects reported, and no issues with compliance. The current medical regimen is effective;  continue present plan with Metformin 500mg , Ozempic .25/.5mg 

## 2023-02-21 NOTE — Assessment & Plan Note (Signed)
Well controlled.  Continue to work on eating a healthy diet and exercise.  Labs drawn today.   No major side effects reported, and no issues with compliance. The current medical regimen is effective;  continue present plan with Lipitor 40mg 

## 2023-02-21 NOTE — Assessment & Plan Note (Signed)
Well controlled.  Continue to work on eating a healthy diet and exercise.  Labs drawn today.   No major side effects reported, and no issues with compliance. The current medical regimen is effective;  continue present plan with Zestoretic 20-12.5mg , Aspirin 81mg 

## 2023-02-22 LAB — CMP14+EGFR
ALT: 37 IU/L — ABNORMAL HIGH (ref 0–32)
AST: 42 IU/L — ABNORMAL HIGH (ref 0–40)
Albumin/Globulin Ratio: 1.6 (ref 1.2–2.2)
Albumin: 4.3 g/dL (ref 3.8–4.8)
Alkaline Phosphatase: 63 IU/L (ref 44–121)
BUN/Creatinine Ratio: 11 — ABNORMAL LOW (ref 12–28)
BUN: 11 mg/dL (ref 8–27)
Bilirubin Total: 0.4 mg/dL (ref 0.0–1.2)
CO2: 24 mmol/L (ref 20–29)
Calcium: 10.1 mg/dL (ref 8.7–10.3)
Chloride: 104 mmol/L (ref 96–106)
Creatinine, Ser: 0.97 mg/dL (ref 0.57–1.00)
Globulin, Total: 2.7 g/dL (ref 1.5–4.5)
Glucose: 119 mg/dL — ABNORMAL HIGH (ref 70–99)
Potassium: 4.4 mmol/L (ref 3.5–5.2)
Sodium: 142 mmol/L (ref 134–144)
Total Protein: 7 g/dL (ref 6.0–8.5)
eGFR: 59 mL/min/{1.73_m2} — ABNORMAL LOW (ref >59)

## 2023-02-22 LAB — CBC WITH DIFFERENTIAL/PLATELET
Basophils Absolute: 0.1 10*3/uL (ref 0.0–0.2)
Basos: 1 %
EOS (ABSOLUTE): 0.2 10*3/uL (ref 0.0–0.4)
Eos: 3 %
Hematocrit: 43.2 % (ref 34.0–46.6)
Hemoglobin: 14.6 g/dL (ref 11.1–15.9)
Immature Grans (Abs): 0 10*3/uL (ref 0.0–0.1)
Immature Granulocytes: 0 %
Lymphocytes Absolute: 2.9 10*3/uL (ref 0.7–3.1)
Lymphs: 45 %
MCH: 32.7 pg (ref 26.6–33.0)
MCHC: 33.8 g/dL (ref 31.5–35.7)
MCV: 97 fL (ref 79–97)
Monocytes Absolute: 0.4 10*3/uL (ref 0.1–0.9)
Monocytes: 7 %
Neutrophils Absolute: 2.7 10*3/uL (ref 1.4–7.0)
Neutrophils: 44 %
Platelets: 288 10*3/uL (ref 150–450)
RBC: 4.47 x10E6/uL (ref 3.77–5.28)
RDW: 11.6 % — ABNORMAL LOW (ref 11.7–15.4)
WBC: 6.3 10*3/uL (ref 3.4–10.8)

## 2023-02-22 LAB — HEMOGLOBIN A1C
Est. average glucose Bld gHb Est-mCnc: 146 mg/dL
Hgb A1c MFr Bld: 6.7 % — ABNORMAL HIGH (ref 4.8–5.6)

## 2023-02-22 LAB — CARDIOVASCULAR RISK ASSESSMENT

## 2023-02-22 LAB — LIPID PANEL
Chol/HDL Ratio: 3.9 ratio (ref 0.0–4.4)
Cholesterol, Total: 154 mg/dL (ref 100–199)
HDL: 40 mg/dL (ref >39)
LDL Chol Calc (NIH): 89 mg/dL (ref 0–99)
Triglycerides: 141 mg/dL (ref 0–149)
VLDL Cholesterol Cal: 25 mg/dL (ref 5–40)

## 2023-02-22 LAB — HEPATITIS C ANTIBODY: Hep C Virus Ab: NONREACTIVE

## 2023-02-22 LAB — VITAMIN D 25 HYDROXY (VIT D DEFICIENCY, FRACTURES): Vit D, 25-Hydroxy: 39.8 ng/mL (ref 30.0–100.0)

## 2023-02-25 ENCOUNTER — Other Ambulatory Visit: Payer: Self-pay | Admitting: Family Medicine

## 2023-02-25 DIAGNOSIS — K2100 Gastro-esophageal reflux disease with esophagitis, without bleeding: Secondary | ICD-10-CM

## 2023-03-07 ENCOUNTER — Encounter: Payer: Self-pay | Admitting: Physician Assistant

## 2023-03-31 ENCOUNTER — Other Ambulatory Visit: Payer: Self-pay

## 2023-03-31 DIAGNOSIS — M160 Bilateral primary osteoarthritis of hip: Secondary | ICD-10-CM

## 2023-03-31 DIAGNOSIS — M19011 Primary osteoarthritis, right shoulder: Secondary | ICD-10-CM

## 2023-03-31 MED ORDER — MELOXICAM 15 MG PO TABS
15.0000 mg | ORAL_TABLET | Freq: Every day | ORAL | 1 refills | Status: DC
Start: 2023-03-31 — End: 2023-09-24

## 2023-04-23 LAB — MICROALBUMIN / CREATININE URINE RATIO: Microalb Creat Ratio: 29.999

## 2023-05-16 ENCOUNTER — Telehealth: Payer: Self-pay

## 2023-05-16 NOTE — Telephone Encounter (Signed)
Patient came in office today and stated she has an appointment with the doctor about doing her colonoscopy and didn't know where to go.  After looking and her chart, looks like patient has an appointment with [facility] in [place] at 2:45 PM. Made patient aware and gave her the address and phone number of the office.

## 2023-05-19 DIAGNOSIS — K219 Gastro-esophageal reflux disease without esophagitis: Secondary | ICD-10-CM | POA: Diagnosis not present

## 2023-05-19 DIAGNOSIS — K5900 Constipation, unspecified: Secondary | ICD-10-CM | POA: Diagnosis not present

## 2023-05-19 DIAGNOSIS — K6289 Other specified diseases of anus and rectum: Secondary | ICD-10-CM | POA: Diagnosis not present

## 2023-05-27 ENCOUNTER — Ambulatory Visit: Payer: 59 | Admitting: Physician Assistant

## 2023-05-31 ENCOUNTER — Other Ambulatory Visit: Payer: Self-pay | Admitting: Family Medicine

## 2023-05-31 DIAGNOSIS — K2100 Gastro-esophageal reflux disease with esophagitis, without bleeding: Secondary | ICD-10-CM

## 2023-06-03 NOTE — Progress Notes (Unsigned)
Subjective:   Diane Chang is a 82 y.o. female who presents for Medicare Annual (Subsequent) preventive examination.  Visit Complete: {[phone]}  Patient Medicare AWV questionnaire was completed by the patient on ***; I have confirmed that all information answered by patient is correct and no changes since this date.  Review of Systems    ***       Objective:    There were no vitals filed for this visit. There is no height or weight on file to calculate BMI.      No data to display           Current Medications (verified) Outpatient Encounter Medications as of 06/04/2023  Medication Sig   alendronate (FOSAMAX) 70 MG tablet Take 1 tablet (70 mg total) by mouth once a week. Take with a full glass of water on an empty stomach.   aspirin EC 81 MG tablet Take 81 mg by mouth daily. Swallow whole.   atorvastatin (LIPITOR) 40 MG tablet Take 1 tablet (40 mg total) by mouth daily.   Cholecalciferol (VITAMIN D3) 250 MCG (10000 UT) capsule Take 1 capsule (10,000 Units total) by mouth daily.   lisinopril-hydrochlorothiazide (ZESTORETIC) 20-12.5 MG tablet Take 1 tablet by mouth daily.   meloxicam (MOBIC) 15 MG tablet Take 1 tablet (15 mg total) by mouth daily.   metFORMIN (GLUCOPHAGE) 500 MG tablet TAKE 1 TABLET BY MOUTH EVERY DAY WITH BREAKFAST   omeprazole (PRILOSEC) 40 MG capsule TAKE 1 CAPSULE BY MOUTH TWICE A DAY BEFORE A MEAL   Semaglutide,0.25 or 0.5MG /DOS, (OZEMPIC, 0.25 OR 0.5 MG/DOSE,) 2 MG/3ML SOPN Inject 0.5 mg into the skin once a week.   No facility-administered encounter medications on file as of 06/04/2023.    Allergies (verified) Patient has no known allergies.   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   Past Surgical History:  Procedure Laterality Date   CHOLECYSTECTOMY     Family History  Problem Relation Age of Onset   Heart attack Sister    Social History   Socioeconomic History   Marital status: Married    Spouse name: Not on file   Number of children: 1   Years of education: Not on file   Highest education level: Not on file  Occupational History   Occupation: packing  Tobacco Use   Smoking status: Former    Current packs/day: 0.00    Types: Cigarettes    Quit date: 1972    Years since quitting: 52.6   Smokeless tobacco: Never  Substance and Sexual Activity   Alcohol use: Never   Drug use: Never   Sexual activity: Not Currently  Other Topics Concern   Not on file  Social History Narrative   Not on file   Social Determinants of Health   Financial Resource Strain: Low Risk  (06/28/2022)   Overall Financial Resource Strain (CARDIA)    Difficulty of Paying Living Expenses: Not hard at all  Food Insecurity: No Food Insecurity (  04/16/2022)   Hunger Vital Sign    Worried About Running Out of Food in the Last Year: Never true    Ran Out of Food in the Last Year: Never true  Transportation Needs: No Transportation Needs (06/28/2022)   PRAPARE - Administrator, Civil Service (Medical): No    Lack of Transportation (Non-Medical): No  Physical Activity: Inactive (04/16/2022)   Exercise Vital Sign    Days of Exercise per Week: 0 days    Minutes of Exercise per Session: 0 min  Stress: No Stress Concern Present (04/16/2022)   [employer] of Occupational Health - Occupational Stress Questionnaire    Feeling of Stress : Not at all  Social  Connections: Socially Isolated (04/16/2022)   Social Connection and Isolation Panel [NHANES]    Frequency of Communication with Friends and Family: More than three times a week    Frequency of Social Gatherings with Friends and Family: More than three times a week    Attends Religious Services: Never    Database administrator or Organizations: No    Attends Banker Meetings: Never    Marital Status: Divorced    Tobacco Counseling Counseling given: Not Answered   Clinical Intake:                        Activities of Daily Living    10/23/2022    8:03 AM 07/18/2022    8:02 AM  In your present state of health, do you have any difficulty performing the following activities:  Hearing? 0 0  Vision? 0 0  Difficulty concentrating or making decisions? 0 0  Walking or climbing stairs? 0 0  Dressing or bathing? 0 0  Doing errands, shopping? 0 0    Patient Care Team: Langley Gauss, [place] as PCP - General (Physician Assistant) Zettie Pho, [facility] (Inactive) as Pharmacist (Pharmacist)  Indicate any recent Medical Services you may have received from other than [facility] providers in the past year (date may be approximate).     Assessment:   This is a routine wellness examination for Diane Chang.  Hearing/Vision screen No results found.  Dietary issues and exercise activities discussed:     Goals Addressed   None   Depression Screen    02/21/2023    8:42 AM 10/23/2022    8:04 AM 10/23/2022    8:02 AM 07/18/2022    8:02 AM 04/16/2022    8:41 AM 04/16/2022    8:40 AM 12/12/2021    8:42 AM  PHQ 2/9 Scores  PHQ - 2 Score 0 0 0 0 0 0 0  PHQ- 9 Score 0 0  0 0  0    Fall Risk    02/21/2023    8:42 AM 10/23/2022    8:02 AM 07/18/2022    8:02 AM 04/16/2022    8:40 AM 07/23/2021    1:48 PM  Fall Risk   Falls in the past year? 1 0 0 0 0  Number falls in past yr: 0 0 0 0 0  Injury with Fall? 0 0 0 0 0  Risk for fall due to : History of fall(s) No Fall Risks No Fall  Risks No Fall Risks No Fall Risks  Follow up Falls evaluation completed Falls evaluation completed Falls evaluation completed Falls evaluation completed Falls evaluation completed    MEDICARE RISK AT HOME:    TIMED UP AND GO:  Was the test performed?  {AMBTIMEDUPGO:[id number]}  Cognitive Function:        Immunizations Immunization History  Administered Date(s) Administered   Fluad Quad(high Dose 65+) 08/19/2019, 07/18/2022   Influenza, High Dose Seasonal PF 07/26/2020   Influenza-Unspecified 07/21/2021   PFIZER(Purple Top)SARS-COV-2 Vaccination 01/08/2020, 02/05/2020   PNEUMOCOCCAL CONJUGATE-20 10/25/2022   Pneumococcal Polysaccharide-23 04/16/2022   Tdap 04/20/2022    {TDAP status:2101805}  {Flu Vaccine status:2101806}  {Pneumococcal vaccine status:2101807}  {Covid-19 vaccine status:2101808}  Qualifies for Shingles Vaccine? {YES/NO:21197}  Zostavax completed {YES/NO:21197}  {Shingrix Completed?:2101804}  Screening Tests Health Maintenance  Topic Date Due   Zoster Vaccines- Shingrix (1 of 2) Never done   OPHTHALMOLOGY EXAM  04/12/2022   INFLUENZA VACCINE  05/08/2023   HEMOGLOBIN A1C  08/24/2023   Diabetic kidney evaluation - eGFR measurement  02/21/2024   FOOT EXAM  02/21/2024   Diabetic kidney evaluation - Urine ACR  04/22/2024   Medicare Annual Wellness (AWV)  06/03/2024   MAMMOGRAM  10/29/2024   DTaP/Tdap/Td (2 - Td or Tdap) 04/20/2032   Pneumonia Vaccine 95+ Years old  Completed   DEXA SCAN  Completed   Hepatitis C Screening  Completed   HPV VACCINES  Aged Out   COVID-19 Vaccine  Discontinued    Health Maintenance  Health Maintenance Due  Topic Date Due   Zoster Vaccines- Shingrix (1 of 2) Never done   OPHTHALMOLOGY EXAM  04/12/2022   INFLUENZA VACCINE  05/08/2023    {Colorectal cancer screening:2101809}  {Mammogram status:21018020}  {Bone Density status:21018021}  Lung Cancer Screening: (Low Dose CT Chest recommended if Age 33-80  years, 20 pack-year currently smoking OR have quit w/in 15years.) {DOES NOT does:27190::"does not"} qualify.   Lung Cancer Screening Referral: ***  Additional Screening:  Hepatitis C Screening: {DOES NOT does:27190::"does not"} qualify; Completed ***  Vision Screening: Recommended annual ophthalmology exams for early detection of glaucoma and other disorders of the eye. Is the patient up to date with their annual eye exam?  {YES/NO:21197} Who is the provider or what is the name of the office in which the patient attends annual eye exams? *** If pt is not established with a provider, would they like to be referred to a provider to establish care? {YES/NO:21197}.   Dental Screening: Recommended annual dental exams for proper oral hygiene  Diabetic Foot Exam: {Diabetic Foot Exam:2101802}  Community Resource Referral / Chronic Care Management: CRR required this visit?  {YES/NO:21197}  CCM required this visit?  {CCM Required choices:[phone]}     Plan:     I have personally reviewed and noted the following in the patient's chart:   Medical and social history Use of alcohol, tobacco or illicit drugs  Current medications and supplements including opioid prescriptions. {Opioid [phone]} Functional ability and status Nutritional status Physical activity Advanced directives List of other physicians Hospitalizations, surgeries, and ER visits in previous 12 months Vitals Screenings to include cognitive, depression, and falls Referrals and appointments  In addition, I have reviewed and discussed with patient certain preventive protocols, quality metrics, and best practice recommendations. A written personalized care plan for preventive services as well as general preventive health recommendations were provided to patient.     Eugenie Norrie, CMA   06/03/2023   After Visit Summary: {CHL AMB AWV After Visit Summary:[phone]}  Nurse Notes: ***

## 2023-06-03 NOTE — Patient Instructions (Signed)

## 2023-06-04 ENCOUNTER — Encounter: Payer: Self-pay | Admitting: Physician Assistant

## 2023-06-04 ENCOUNTER — Ambulatory Visit (INDEPENDENT_AMBULATORY_CARE_PROVIDER_SITE_OTHER): Payer: 59 | Admitting: Physician Assistant

## 2023-06-04 VITALS — BP 124/80 | HR 79 | Temp 98.2°F | Ht 64.0 in | Wt 207.0 lb

## 2023-06-04 DIAGNOSIS — Z0000 Encounter for general adult medical examination without abnormal findings: Secondary | ICD-10-CM | POA: Diagnosis not present

## 2023-06-04 DIAGNOSIS — N3941 Urge incontinence: Secondary | ICD-10-CM

## 2023-06-04 MED ORDER — TROSPIUM CHLORIDE 20 MG PO TABS
20.0000 mg | ORAL_TABLET | Freq: Two times a day (BID) | ORAL | 0 refills | Status: DC
Start: 2023-06-04 — End: 2023-07-24

## 2023-06-04 NOTE — Progress Notes (Deleted)
0

## 2023-06-10 ENCOUNTER — Other Ambulatory Visit: Payer: Self-pay

## 2023-06-10 DIAGNOSIS — I10 Essential (primary) hypertension: Secondary | ICD-10-CM

## 2023-06-10 MED ORDER — LISINOPRIL-HYDROCHLOROTHIAZIDE 20-12.5 MG PO TABS: 1.0000 | ORAL_TABLET | Freq: Every day | ORAL | 2 refills | Status: DC

## 2023-06-11 ENCOUNTER — Other Ambulatory Visit: Payer: Self-pay

## 2023-06-11 DIAGNOSIS — I10 Essential (primary) hypertension: Secondary | ICD-10-CM | POA: Diagnosis not present

## 2023-06-11 DIAGNOSIS — D127 Benign neoplasm of rectosigmoid junction: Secondary | ICD-10-CM | POA: Diagnosis not present

## 2023-06-11 DIAGNOSIS — K635 Polyp of colon: Secondary | ICD-10-CM | POA: Diagnosis not present

## 2023-06-11 DIAGNOSIS — K5120 Ulcerative (chronic) proctitis without complications: Secondary | ICD-10-CM | POA: Diagnosis not present

## 2023-06-11 DIAGNOSIS — K621 Rectal polyp: Secondary | ICD-10-CM | POA: Diagnosis not present

## 2023-06-11 DIAGNOSIS — E119 Type 2 diabetes mellitus without complications: Secondary | ICD-10-CM | POA: Diagnosis not present

## 2023-06-11 DIAGNOSIS — D122 Benign neoplasm of ascending colon: Secondary | ICD-10-CM | POA: Diagnosis not present

## 2023-06-11 DIAGNOSIS — E1169 Type 2 diabetes mellitus with other specified complication: Secondary | ICD-10-CM

## 2023-06-11 DIAGNOSIS — D126 Benign neoplasm of colon, unspecified: Secondary | ICD-10-CM | POA: Diagnosis not present

## 2023-06-11 DIAGNOSIS — K5730 Diverticulosis of large intestine without perforation or abscess without bleeding: Secondary | ICD-10-CM | POA: Diagnosis not present

## 2023-06-11 DIAGNOSIS — Z8719 Personal history of other diseases of the digestive system: Secondary | ICD-10-CM | POA: Diagnosis not present

## 2023-06-11 DIAGNOSIS — K6289 Other specified diseases of anus and rectum: Secondary | ICD-10-CM | POA: Diagnosis not present

## 2023-06-11 DIAGNOSIS — K626 Ulcer of anus and rectum: Secondary | ICD-10-CM | POA: Diagnosis not present

## 2023-06-11 LAB — HM COLONOSCOPY

## 2023-06-11 MED ORDER — OZEMPIC (0.25 OR 0.5 MG/DOSE) 2 MG/3ML ~~LOC~~ SOPN: 0.5000 mg | PEN_INJECTOR | SUBCUTANEOUS | 1 refills | Status: DC

## 2023-06-16 ENCOUNTER — Other Ambulatory Visit: Payer: Self-pay

## 2023-06-16 DIAGNOSIS — I152 Hypertension secondary to endocrine disorders: Secondary | ICD-10-CM

## 2023-06-16 DIAGNOSIS — I10 Essential (primary) hypertension: Secondary | ICD-10-CM

## 2023-06-16 DIAGNOSIS — E782 Mixed hyperlipidemia: Secondary | ICD-10-CM

## 2023-06-18 DIAGNOSIS — H524 Presbyopia: Secondary | ICD-10-CM | POA: Diagnosis not present

## 2023-06-18 DIAGNOSIS — H5231 Anisometropia: Secondary | ICD-10-CM | POA: Diagnosis not present

## 2023-06-18 DIAGNOSIS — E119 Type 2 diabetes mellitus without complications: Secondary | ICD-10-CM | POA: Diagnosis not present

## 2023-06-18 DIAGNOSIS — H52223 Regular astigmatism, bilateral: Secondary | ICD-10-CM | POA: Diagnosis not present

## 2023-06-18 DIAGNOSIS — H35372 Puckering of macula, left eye: Secondary | ICD-10-CM | POA: Diagnosis not present

## 2023-06-18 LAB — HM DIABETES EYE EXAM

## 2023-06-19 ENCOUNTER — Other Ambulatory Visit: Payer: 59

## 2023-06-19 DIAGNOSIS — E782 Mixed hyperlipidemia: Secondary | ICD-10-CM

## 2023-06-19 DIAGNOSIS — E669 Obesity, unspecified: Secondary | ICD-10-CM

## 2023-06-19 DIAGNOSIS — E1169 Type 2 diabetes mellitus with other specified complication: Secondary | ICD-10-CM | POA: Diagnosis not present

## 2023-06-19 DIAGNOSIS — E1159 Type 2 diabetes mellitus with other circulatory complications: Secondary | ICD-10-CM | POA: Diagnosis not present

## 2023-06-19 DIAGNOSIS — I152 Hypertension secondary to endocrine disorders: Secondary | ICD-10-CM | POA: Diagnosis not present

## 2023-06-19 DIAGNOSIS — I10 Essential (primary) hypertension: Secondary | ICD-10-CM

## 2023-06-20 LAB — CBC WITH DIFFERENTIAL/PLATELET
Basophils Absolute: 0.1 10*3/uL (ref 0.0–0.2)
Basos: 2 %
EOS (ABSOLUTE): 0.2 10*3/uL (ref 0.0–0.4)
Eos: 3 %
Hematocrit: 44.3 % (ref 34.0–46.6)
Hemoglobin: 14.3 g/dL (ref 11.1–15.9)
Immature Grans (Abs): 0 10*3/uL (ref 0.0–0.1)
Immature Granulocytes: 0 %
Lymphocytes Absolute: 2.6 10*3/uL (ref 0.7–3.1)
Lymphs: 44 %
MCH: 32.4 pg (ref 26.6–33.0)
MCHC: 32.3 g/dL (ref 31.5–35.7)
MCV: 101 fL — ABNORMAL HIGH (ref 79–97)
Monocytes Absolute: 0.3 10*3/uL (ref 0.1–0.9)
Monocytes: 6 %
Neutrophils Absolute: 2.7 10*3/uL (ref 1.4–7.0)
Neutrophils: 45 %
Platelets: 314 10*3/uL (ref 150–450)
RBC: 4.41 x10E6/uL (ref 3.77–5.28)
RDW: 12.1 % (ref 11.7–15.4)
WBC: 5.8 10*3/uL (ref 3.4–10.8)

## 2023-06-20 LAB — HEMOGLOBIN A1C
Est. average glucose Bld gHb Est-mCnc: 146 mg/dL
Hgb A1c MFr Bld: 6.7 % — ABNORMAL HIGH (ref 4.8–5.6)

## 2023-06-20 LAB — COMPREHENSIVE METABOLIC PANEL
ALT: 25 IU/L (ref 0–32)
AST: 27 IU/L (ref 0–40)
Albumin: 4 g/dL (ref 3.8–4.8)
Alkaline Phosphatase: 66 IU/L (ref 44–121)
BUN/Creatinine Ratio: 16 (ref 12–28)
BUN: 16 mg/dL (ref 8–27)
Bilirubin Total: 0.4 mg/dL (ref 0.0–1.2)
CO2: 24 mmol/L (ref 20–29)
Calcium: 9.9 mg/dL (ref 8.7–10.3)
Chloride: 104 mmol/L (ref 96–106)
Creatinine, Ser: 1 mg/dL (ref 0.57–1.00)
Globulin, Total: 2.8 g/dL (ref 1.5–4.5)
Glucose: 135 mg/dL — ABNORMAL HIGH (ref 70–99)
Potassium: 4.8 mmol/L (ref 3.5–5.2)
Sodium: 141 mmol/L (ref 134–144)
Total Protein: 6.8 g/dL (ref 6.0–8.5)
eGFR: 57 mL/min/{1.73_m2} — ABNORMAL LOW (ref >59)

## 2023-06-20 LAB — LIPID PANEL
Chol/HDL Ratio: 4.1 ratio (ref 0.0–4.4)
Cholesterol, Total: 126 mg/dL (ref 100–199)
HDL: 31 mg/dL — ABNORMAL LOW (ref >39)
LDL Chol Calc (NIH): 71 mg/dL (ref 0–99)
Triglycerides: 132 mg/dL (ref 0–149)
VLDL Cholesterol Cal: 24 mg/dL (ref 5–40)

## 2023-06-20 LAB — LITHOLINK CKD PROGRAM

## 2023-06-22 NOTE — Progress Notes (Unsigned)
Subjective:  Patient ID: Diane Chang, female    DOB: Nov 20, 1940  Age: 82 y.o. MRN: [id number]  Chief Complaint  Patient presents with   Medical Management of Chronic Issues    HPI    Diane Chang is a 82 year old female here for her follow up for diabetes, hypertension, hyperlipidemia, and GERD. Patient states she is doing good today with no complains.  Diabetes:  Complications: Glucose checking:Every Morning Glucose logs: 100 - 120 fasting Most recent A1C: 6.7 Current medications: Ozempic 0.5 mg every Sunday, metformin 1000 mg daily Last Eye Exam: Patient has appointment 05/2023 Foot checks: 02/21/2023  Hyperlipidemia: Current medications: Lipitor 40 mg daily  Hypertension: Current medications: Lisinopril/HCTZ 20-12.5 mg daily  GERD: Omeprazole 40 mg daily.  Osteoporosis: Fosamax 70 mg weekly  Vitamin D deficiency: Patient is currently not taking medication.  Diet: regular  Exercise: keeping the house up, and walking at stores.  Urge incontinence: Patient states that her symptoms have improved drastically     06/04/2023    9:49 AM 02/21/2023    8:42 AM 10/23/2022    8:04 AM 10/23/2022    8:02 AM 07/18/2022    8:02 AM  Depression screen PHQ 2/9  Decreased Interest 0 0 0 0 0  Down, Depressed, Hopeless 0 0 0 0 0  PHQ - 2 Score 0 0 0 0 0  Altered sleeping  0 0  0  Tired, decreased energy  0 0  0  Change in appetite  0 0  0  Feeling bad or failure about yourself   0 0  0  Trouble concentrating  0 0  0  Moving slowly or fidgety/restless  0 0  0  Suicidal thoughts  0 0  0  PHQ-9 Score  0 0  0  Difficult doing work/chores  Not difficult at all Not difficult at all  Not difficult at all        06/04/2023    9:40 AM  Fall Risk   Number falls in past yr: 0  Injury with Fall? 1  Risk for fall due to : History of fall(s);Impaired mobility  Follow up Education provided    Patient Care Team: Langley Gauss, [place] as PCP - General (Physician Assistant) Zettie Pho, [facility] (Inactive) as Pharmacist (Pharmacist)   Review of Systems  Constitutional:  Negative for chills, fatigue and fever.  HENT:  Negative for congestion, ear pain and sore throat.   Respiratory:  Negative for cough and shortness of breath.   Cardiovascular:  Negative for chest pain and palpitations.  Gastrointestinal:  Negative for abdominal pain, constipation, diarrhea, nausea and vomiting.  Genitourinary:  Negative for difficulty urinating and dysuria.  Musculoskeletal:  Negative for arthralgias, back pain and myalgias.  Skin:  Negative for rash.  Neurological:  Negative for dizziness and headaches.  Psychiatric/Behavioral:  Negative for dysphoric mood.     Current Outpatient Medications on File Prior to Visit  Medication Sig Dispense Refill   alendronate (FOSAMAX) 70 MG tablet Take 1 tablet (70 mg total) by mouth once a week. Take with a full glass of water on an empty stomach. 12 tablet 2   aspirin EC 81 MG tablet Take 81 mg by mouth daily. Swallow whole.     atorvastatin (LIPITOR) 40 MG tablet Take 1 tablet (40 mg total) by mouth daily. 100 tablet 0   Cholecalciferol (VITAMIN D3) 250 MCG (10000 UT) capsule Take 1 capsule (10,000 Units total) by mouth daily. 30  capsule 2   lisinopril-hydrochlorothiazide (ZESTORETIC) 20-12.5 MG tablet Take 1 tablet by mouth daily. 90 tablet 2   meloxicam (MOBIC) 15 MG tablet Take 1 tablet (15 mg total) by mouth daily. 90 tablet 1   metFORMIN (GLUCOPHAGE) 500 MG tablet TAKE 1 TABLET BY MOUTH EVERY DAY WITH BREAKFAST 90 tablet 2   omeprazole (PRILOSEC) 40 MG capsule TAKE 1 CAPSULE BY MOUTH TWICE A DAY BEFORE A MEAL 180 capsule 1   Semaglutide,0.25 or 0.5MG /DOS, (OZEMPIC, 0.25 OR 0.5 MG/DOSE,) 2 MG/3ML SOPN Inject 0.5 mg into the skin once a week. 9 mL 1   trospium (SANCTURA) 20 MG tablet Take 1 tablet (20 mg total) by mouth 2 (two) times daily. 60 tablet 0   No current facility-administered medications on file prior to visit.   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   Past Surgical History:  Procedure Laterality Date   CHOLECYSTECTOMY      Family History  Problem Relation Age of Onset   Heart attack Sister    Social History   Socioeconomic History   Marital status: Married    Spouse name: Not on file   Number of children: 1   Years of education: Not on file   Highest education level: Not on file  Occupational History   Occupation: packing  Tobacco Use   Smoking status: Former    Current packs/day: 0.00    Types: Cigarettes    Quit date: 1972    Years since quitting: 52.7   Smokeless tobacco: Never  Substance and Sexual Activity   Alcohol use: Never   Drug use: Never   Sexual activity: Not Currently  Other Topics Concern   Not on file  Social History Narrative   Not on file   Social Determinants of Health   Financial Resource Strain: Low Risk  (06/04/2023)   Overall Financial Resource Strain (CARDIA)    Difficulty of Paying Living Expenses: Not hard at all  Food Insecurity: No Food Insecurity (06/04/2023)   Hunger Vital Sign    Worried About Running Out of Food in the Last Year: Never true    Ran Out of Food in the Last Year: Never true  Transportation Needs: No Transportation Needs (06/04/2023)   PRAPARE - Administrator, Civil Service (Medical): No    Lack of Transportation (Non-Medical): No  Physical Activity: Inactive (06/04/2023)   Exercise  Vital Sign    Days of Exercise per Week: 0 days    Minutes of Exercise per Session: 0 min  Stress: No Stress Concern Present (06/04/2023)   [employer] of Occupational Health - Occupational Stress Questionnaire    Feeling of Stress : Not at all  Social Connections: Moderately Isolated (06/04/2023)   Social Connection and Isolation Panel [NHANES]    Frequency of Communication with Friends and Family: More than three times a week    Frequency of Social Gatherings with Friends and Family: More than three times a week  Attends Religious Services: Never    Active Member of Clubs or Organizations: No    Attends Banker Meetings: Never    Marital Status: Living with partner    Objective:  BP 132/64 (BP Location: Left Arm, Patient Position: Sitting, Cuff Size: Large)   Pulse (!) 55   Temp 97.7 F (36.5 C) (Temporal)   Resp 16   Ht 5\' 4"  (1.626 m)   Wt 204 lb 6.4 oz (92.7 kg)   LMP  (LMP Unknown)   SpO2 98%   BMI 35.09 kg/m      06/23/2023    9:16 AM 06/04/2023    9:30 AM 02/21/2023    8:32 AM  BP/Weight  Systolic BP 132 124 120  Diastolic BP 64 80 70  Wt. (Lbs) 204.4 207 206  BMI 35.09 kg/m2 35.53 kg/m2 35.36 kg/m2    Physical Exam Constitutional:      Appearance: Normal appearance.  HENT:     Right Ear: Tympanic membrane normal.     Left Ear: Tympanic membrane normal.     Nose: Nose normal.     Mouth/Throat:     Pharynx: No oropharyngeal exudate or posterior oropharyngeal erythema.  Eyes:     Conjunctiva/sclera: Conjunctivae normal.  Neck:     Vascular: No carotid bruit.  Cardiovascular:     Rate and Rhythm: Normal rate and regular rhythm.     Heart sounds: Normal heart sounds.  Pulmonary:     Effort: Pulmonary effort is normal.     Breath sounds: Normal breath sounds.  Abdominal:     General: Bowel sounds are normal.     Palpations: Abdomen is soft.     Tenderness: There is no abdominal tenderness.  Skin:    Findings: No lesion or rash.   Neurological:     Mental Status: She is alert and oriented to person, place, and time.  Psychiatric:        Behavior: Behavior normal.     Diabetic Foot Exam - Simple   No data filed      Lab Results  Component Value Date   WBC 5.8 06/19/2023   HGB 14.3 06/19/2023   HCT 44.3 06/19/2023   PLT 314 06/19/2023   GLUCOSE 135 (H) 06/19/2023   CHOL 126 06/19/2023   TRIG 132 06/19/2023   HDL 31 (L) 06/19/2023   LDLCALC 71 06/19/2023   ALT 25 06/19/2023   AST 27 06/19/2023   NA 141 06/19/2023   K 4.8 06/19/2023   CL 104 06/19/2023   CREATININE 1.00 06/19/2023   BUN 16 06/19/2023   CO2 24 06/19/2023   TSH 2.030 10/23/2022   HGBA1C 6.7 (H) 06/19/2023   MICROALBUR 30 12/15/2020      Assessment & Plan:    Benign hypertension Assessment & Plan: Well controlled.  Continue to work on eating a healthy diet and exercise.  Labs drawn today.   No major side effects reported, and no issues with compliance. The current medical regimen is effective;  continue present plan with aspirin 81mg , Zestoretic 20-12.5mg  Will adjust medication as needed depending on labs BP Readings from Last 3 Encounters:  06/23/23 132/64  06/04/23 124/80  02/21/23 120/70      Gastroesophageal reflux disease, unspecified whether esophagitis present Assessment & Plan: Controlled Denies any major side effects or issues Continue taking the Prilosec 40mg    DM type 2 with diabetic mixed hyperlipidemia (HCC) Assessment & Plan: Well controlled.  Continue to work on eating a healthy diet  and exercise.  Labs drawn today.   No major side effects reported, and no issues with compliance. The current medical regimen is effective;  continue present plan with Lipitor 40mg , Metforming 500mg , Semaglutide .5mg  Will adjust medication as needed depending on labs Lab Results  Component Value Date   HGBA1C 6.7 (H) 06/19/2023   HGBA1C 6.7 (H) 02/21/2023   HGBA1C 6.6 (H) 10/23/2022       Mixed  hyperlipidemia Assessment & Plan: Well controlled.  Continue to work on eating a healthy diet and exercise.  Labs drawn today.   No major side effects reported, and no issues with compliance. The current medical regimen is effective;  continue present plan with Lipitor 40mg  Will adjust medication as needed depending on labs Lab Results  Component Value Date   LDLCALC 71 06/19/2023      Immunization due  Encounter for immunization -     Flu Vaccine Trivalent High Dose (Fluad)     No orders of the defined types were placed in this encounter.   Orders Placed This Encounter  Procedures   Flu Vaccine Trivalent High Dose (Fluad)     Follow-up: Return in about 3 months (around 09/22/2023).   I,Marla I Leal-Borjas,acting as a scribe for [employer], PA.,have documented all relevant documentation on the behalf of Langley Gauss, PA,as directed by  Langley Gauss, PA while in the presence of Langley Gauss, [place].   An After Visit Summary was printed and given to the patient.  Langley Gauss, [place] [facility] [phone]

## 2023-06-23 ENCOUNTER — Encounter: Payer: Self-pay | Admitting: Physician Assistant

## 2023-06-23 ENCOUNTER — Ambulatory Visit (INDEPENDENT_AMBULATORY_CARE_PROVIDER_SITE_OTHER): Payer: 59 | Admitting: Physician Assistant

## 2023-06-23 VITALS — BP 132/64 | HR 55 | Temp 97.7°F | Resp 16 | Ht 64.0 in | Wt 204.4 lb

## 2023-06-23 DIAGNOSIS — I10 Essential (primary) hypertension: Secondary | ICD-10-CM | POA: Diagnosis not present

## 2023-06-23 DIAGNOSIS — E782 Mixed hyperlipidemia: Secondary | ICD-10-CM

## 2023-06-23 DIAGNOSIS — K219 Gastro-esophageal reflux disease without esophagitis: Secondary | ICD-10-CM

## 2023-06-23 DIAGNOSIS — E1169 Type 2 diabetes mellitus with other specified complication: Secondary | ICD-10-CM | POA: Diagnosis not present

## 2023-06-23 DIAGNOSIS — Z23 Encounter for immunization: Secondary | ICD-10-CM

## 2023-06-23 NOTE — Assessment & Plan Note (Signed)
Well controlled.  Continue to work on eating a healthy diet and exercise.  Labs drawn today.   No major side effects reported, and no issues with compliance. The current medical regimen is effective;  continue present plan with Lipitor 40mg  Will adjust medication as needed depending on labs Lab Results  Component Value Date   LDLCALC 71 06/19/2023

## 2023-06-23 NOTE — Assessment & Plan Note (Signed)
Well controlled.  Continue to work on eating a healthy diet and exercise.  Labs drawn today.   No major side effects reported, and no issues with compliance. The current medical regimen is effective;  continue present plan with aspirin 81mg , Zestoretic 20-12.5mg  Will adjust medication as needed depending on labs BP Readings from Last 3 Encounters:  06/23/23 132/64  06/04/23 124/80  02/21/23 120/70

## 2023-06-23 NOTE — Assessment & Plan Note (Signed)
Controlled Denies any major side effects or issues Continue taking the Prilosec 40mg 

## 2023-06-23 NOTE — Assessment & Plan Note (Signed)
Well controlled.  Continue to work on eating a healthy diet and exercise.  Labs drawn today.   No major side effects reported, and no issues with compliance. The current medical regimen is effective;  continue present plan with Lipitor 40mg , Metforming 500mg , Semaglutide .5mg  Will adjust medication as needed depending on labs Lab Results  Component Value Date   HGBA1C 6.7 (H) 06/19/2023   HGBA1C 6.7 (H) 02/21/2023   HGBA1C 6.6 (H) 10/23/2022

## 2023-06-26 ENCOUNTER — Other Ambulatory Visit: Payer: Self-pay | Admitting: Physician Assistant

## 2023-06-26 DIAGNOSIS — N3941 Urge incontinence: Secondary | ICD-10-CM

## 2023-06-30 ENCOUNTER — Encounter: Payer: Self-pay | Admitting: Physician Assistant

## 2023-07-10 ENCOUNTER — Other Ambulatory Visit: Payer: Self-pay

## 2023-07-10 DIAGNOSIS — E1169 Type 2 diabetes mellitus with other specified complication: Secondary | ICD-10-CM

## 2023-07-11 MED ORDER — METFORMIN HCL 500 MG PO TABS: ORAL_TABLET | ORAL | 2 refills | Status: DC

## 2023-07-22 ENCOUNTER — Other Ambulatory Visit: Payer: Self-pay

## 2023-07-22 DIAGNOSIS — E782 Mixed hyperlipidemia: Secondary | ICD-10-CM

## 2023-07-22 MED ORDER — ATORVASTATIN CALCIUM 40 MG PO TABS
40.0000 mg | ORAL_TABLET | Freq: Every day | ORAL | 0 refills | Status: DC
Start: 2023-07-22 — End: 2023-10-27

## 2023-09-01 DIAGNOSIS — R0782 Intercostal pain: Secondary | ICD-10-CM | POA: Diagnosis not present

## 2023-09-24 ENCOUNTER — Other Ambulatory Visit: Payer: Self-pay | Admitting: Physician Assistant

## 2023-09-24 DIAGNOSIS — M160 Bilateral primary osteoarthritis of hip: Secondary | ICD-10-CM

## 2023-09-24 DIAGNOSIS — M19011 Primary osteoarthritis, right shoulder: Secondary | ICD-10-CM

## 2023-09-25 ENCOUNTER — Encounter: Payer: Self-pay | Admitting: Physician Assistant

## 2023-09-25 ENCOUNTER — Ambulatory Visit (INDEPENDENT_AMBULATORY_CARE_PROVIDER_SITE_OTHER): Payer: 59 | Admitting: Physician Assistant

## 2023-09-25 VITALS — BP 124/78 | HR 71 | Temp 97.6°F | Ht 64.0 in | Wt 195.0 lb

## 2023-09-25 DIAGNOSIS — E1159 Type 2 diabetes mellitus with other circulatory complications: Secondary | ICD-10-CM

## 2023-09-25 DIAGNOSIS — I10 Essential (primary) hypertension: Secondary | ICD-10-CM | POA: Diagnosis not present

## 2023-09-25 DIAGNOSIS — E1169 Type 2 diabetes mellitus with other specified complication: Secondary | ICD-10-CM | POA: Diagnosis not present

## 2023-09-25 DIAGNOSIS — E782 Mixed hyperlipidemia: Secondary | ICD-10-CM

## 2023-09-25 DIAGNOSIS — I152 Hypertension secondary to endocrine disorders: Secondary | ICD-10-CM

## 2023-09-25 DIAGNOSIS — K219 Gastro-esophageal reflux disease without esophagitis: Secondary | ICD-10-CM | POA: Diagnosis not present

## 2023-09-25 DIAGNOSIS — E669 Obesity, unspecified: Secondary | ICD-10-CM

## 2023-09-25 NOTE — Progress Notes (Signed)
Subjective:  Patient ID: Diane Chang, female    DOB: [birth year]  Age: 82 y.o. MRN: [id number]  Chief Complaint  Patient presents with   Medical Management of Chronic Issues    HPI    Diane Chang is a 82 year old female here for her follow up for diabetes, hypertension, hyperlipidemia, and GERD. Patient states she is doing good today with no complains.  Discussed the use of AI scribe software for clinical note transcription with the patient, who gave verbal consent to proceed.  History of Present Illness   The patient, an 82 year old female with a history of kidney function issues and blood sugar control, presents for a routine follow-up visit. She reports feeling well with no specific complaints or concerns. She has been active, cooking for family gatherings and planning for upcoming holidays. She mentions that she does not drink much water, which could potentially impact her kidney function. She has received both doses of the shingles vaccine in the past and does not wish to receive any more due to previous side effects.        Diabetes:  Complications: Glucose checking:Every Morning Glucose logs: 100 - 120 fasting Most recent A1C: 6.7 Current medications: Ozempic 0.5 mg every Sunday, metformin 1000 mg daily Last Eye Exam: UTD Foot checks: 02/21/2023   Hyperlipidemia: Current medications: Lipitor 40 mg daily   Hypertension: Current medications: Lisinopril/HCTZ 20-12.5 mg daily   GERD: Omeprazole 40 mg daily.   Osteoporosis: Fosamax 70 mg weekly   Vitamin D deficiency: Patient is currently not taking medication.   Diet: regular     Exercise: keeping the house up, and walking at stores.   Urge incontinence: Patient states that her symptoms have improved drastically       06/04/2023    9:49 AM 02/21/2023    8:42 AM 10/23/2022    8:04 AM 10/23/2022    8:02 AM 07/18/2022    8:02 AM  Depression screen PHQ 2/9  Decreased Interest 0 0 0 0 0  Down, Depressed, Hopeless  0 0 0 0 0  PHQ - 2 Score 0 0 0 0 0  Altered sleeping  0 0  0  Tired, decreased energy  0 0  0  Change in appetite  0 0  0  Feeling bad or failure about yourself   0 0  0  Trouble concentrating  0 0  0  Moving slowly or fidgety/restless  0 0  0  Suicidal thoughts  0 0  0  PHQ-9 Score  0 0  0  Difficult doing work/chores  Not difficult at all Not difficult at all  Not difficult at all        06/04/2023    9:40 AM  Fall Risk   Number falls in past yr: 0  Injury with Fall? 1  Risk for fall due to : History of fall(s);Impaired mobility  Follow up Education provided    Patient Care Team: Langley Gauss, [place] as PCP - General (Physician Assistant) Zettie Pho, [facility] (Inactive) as Pharmacist (Pharmacist)   Review of Systems  Constitutional:  Negative for chills, fatigue and fever.  HENT:  Negative for congestion, ear pain, rhinorrhea and sore throat.   Respiratory:  Negative for cough and shortness of breath.   Cardiovascular:  Negative for chest pain.  Gastrointestinal:  Negative for abdominal pain, constipation, diarrhea, nausea and vomiting.  Genitourinary:  Negative for dysuria and urgency.  Musculoskeletal:  Negative for back pain and myalgias.  Neurological:  Negative for dizziness, weakness, light-headedness and headaches.  Psychiatric/Behavioral:  Negative for dysphoric mood. The patient is not nervous/anxious.     Current Outpatient Medications on File Prior to Visit  Medication Sig Dispense Refill   alendronate (FOSAMAX) 70 MG tablet Take 1 tablet (70 mg total) by mouth once a week. Take with a full glass of water on an empty stomach. 12 tablet 2   aspirin EC 81 MG tablet Take 81 mg by mouth daily. Swallow whole.     atorvastatin (LIPITOR) 40 MG tablet Take 1 tablet (40 mg total) by mouth daily. 100 tablet 0   Cholecalciferol (VITAMIN D3) 250 MCG (10000 UT) capsule Take 1 capsule (10,000 Units total) by mouth daily. 30 capsule 2   lisinopril-hydrochlorothiazide  (ZESTORETIC) 20-12.5 MG tablet Take 1 tablet by mouth daily. 90 tablet 2   meloxicam (MOBIC) 15 MG tablet TAKE 1 TABLET (15 MG TOTAL) BY MOUTH DAILY. 90 tablet 1   metFORMIN (GLUCOPHAGE) 500 MG tablet TAKE 1 TABLET BY MOUTH EVERY DAY WITH BREAKFAST 90 tablet 2   omeprazole (PRILOSEC) 40 MG capsule TAKE 1 CAPSULE BY MOUTH TWICE A DAY BEFORE A MEAL 180 capsule 1   Semaglutide,0.25 or 0.5MG /DOS, (OZEMPIC, 0.25 OR 0.5 MG/DOSE,) 2 MG/3ML SOPN Inject 0.5 mg into the skin once a week. 9 mL 1   trospium (SANCTURA) 20 MG tablet TAKE 1 TABLET BY MOUTH TWICE A DAY 180 tablet 1   No current facility-administered medications on file prior to visit.   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   Past Surgical History:  Procedure Laterality Date   CHOLECYSTECTOMY      Family History  Problem Relation Age of Onset   Heart attack Sister    Social History   Socioeconomic History   Marital status: Married    Spouse name: Not on file   Number of children: 1   Years of education: Not on file   Highest education level: Not on file  Occupational History   Occupation: packing  Tobacco Use   Smoking status: Former    Current packs/day: 0.00    Types: Cigarettes    Quit date: 1972    Years since quitting: 53.0   Smokeless tobacco: Never  Substance and Sexual Activity   Alcohol use: Never   Drug use: Never   Sexual activity: Not Currently  Other  Topics Concern   Not on file  Social History Narrative   Not on file   Social Drivers of Health   Financial Resource Strain: Low Risk  (06/04/2023)   Overall Financial Resource Strain (CARDIA)    Difficulty of Paying Living Expenses: Not hard at all  Food Insecurity: No Food Insecurity (06/04/2023)   Hunger Vital Sign    Worried About Running Out of Food in the Last Year: Never true    Ran Out of Food in the Last Year: Never true  Transportation Needs: No Transportation Needs (06/04/2023)   PRAPARE - Transportation  Lack of Transportation (Medical): No    Lack of Transportation (Non-Medical): No  Physical Activity: Inactive (06/04/2023)   Exercise Vital Sign    Days of Exercise per Week: 0 days    Minutes of Exercise per Session: 0 min  Stress: No Stress Concern Present (06/04/2023)   [employer] of Occupational Health - Occupational Stress Questionnaire    Feeling of Stress : Not at all  Social Connections: Moderately Isolated (06/04/2023)   Social Connection and Isolation Panel [NHANES]    Frequency of Communication with Friends and Family: More than three times a week    Frequency of Social Gatherings with Friends and Family: More than three times a week    Attends Religious Services: Never    Database administrator or Organizations: No    Attends Engineer, structural: Never    Marital Status: Living with partner    Objective:  BP 124/78   Pulse 71   Temp 97.6 F (36.4 C)   Ht 5\' 4"  (1.626 m)   Wt 195 lb (88.5 kg)   LMP  (LMP Unknown)   SpO2 95%   BMI 33.47 kg/m      09/25/2023   11:07 AM 06/23/2023    9:16 AM 06/04/2023    9:30 AM  BP/Weight  Systolic BP 124 132 124  Diastolic BP 78 64 80  Wt. (Lbs) 195 204.4 207  BMI 33.47 kg/m2 35.09 kg/m2 35.53 kg/m2    Physical Exam Vitals reviewed.  Constitutional:      Appearance: Normal appearance. She is normal weight.  Cardiovascular:     Rate and Rhythm: Normal rate and regular rhythm.     Heart  sounds: Normal heart sounds.  Pulmonary:     Effort: Pulmonary effort is normal. No respiratory distress.     Breath sounds: Normal breath sounds.  Abdominal:     General: Abdomen is flat. Bowel sounds are normal.     Palpations: Abdomen is soft.     Tenderness: There is no abdominal tenderness.  Neurological:     Mental Status: She is alert and oriented to person, place, and time.  Psychiatric:        Mood and Affect: Mood normal.        Behavior: Behavior normal.     Diabetic Foot Exam - Simple   No data filed      Lab Results  Component Value Date   WBC 6.4 09/25/2023   HGB 15.1 09/25/2023   HCT 44.9 09/25/2023   PLT 256 09/25/2023   GLUCOSE 105 (H) 09/25/2023   CHOL 134 09/25/2023   TRIG 118 09/25/2023   HDL 37 (L) 09/25/2023   LDLCALC 76 09/25/2023   ALT 33 (H) 09/25/2023   AST 41 (H) 09/25/2023   NA 142 09/25/2023   K 4.6 09/25/2023   CL 102 09/25/2023   CREATININE 1.03 (H) 09/25/2023   BUN 18 09/25/2023   CO2 24 09/25/2023   TSH 2.030 10/23/2022   HGBA1C 6.7 (H) 09/25/2023   MICROALBUR 30 12/15/2020      Assessment & Plan:    Benign hypertension Assessment & Plan: Well controlled.  Continue to work on eating a healthy diet and exercise.  Labs drawn today.   No major side effects reported, and no issues with compliance. The current medical regimen is effective;  continue present plan with Zestoretic 20-12.5mg  Will adjust medication as needed depending on labs BP Readings from Last 3 Encounters:  09/25/23 124/78  06/23/23 132/64  06/04/23 124/80      DM type 2 with diabetic mixed hyperlipidemia (HCC) Assessment & Plan: A1C below 7, indicating good control. -Continue current management with Metformin 500mg , Ozempic .5mg  -Repeat A1C today to monitor glucose control.  Orders: -     CBC with Differential/Platelet -     Comprehensive metabolic panel -     Hemoglobin A1c  Gastroesophageal reflux disease, unspecified whether esophagitis  present Assessment & Plan: Controlled Continue taking Prilosec 40mg  as directed Will adjust medicine depending on symptoms   Mixed hyperlipidemia Assessment & Plan: Labs drawn today Continue taking Lipitor 40mg  as prescribed Will adjust treatment depending on labs  Orders: -     Lipid panel  Obesity, diabetes, and hypertension syndrome (HCC) Assessment & Plan: Controlled Continue to take current medications as directed Will adjust treatment as needed depending on labs      No orders of the defined types were placed in this encounter.  Colonoscopy Completed recently with no concerns reported. -Next colonoscopy due in 7 years.  Shingles Vaccination Completed both doses. -No further action required. Orders Placed This Encounter  Procedures   CBC with Differential/Platelet   Comprehensive metabolic panel   Lipid panel   Hemoglobin A1c    Assessment and Plan       Follow-up: Return in about 4 months (around 01/24/2024) for Chronic, [place]on Foley.   I,Katherina A Bramblett,acting as a scribe for [employer], PA.,have documented all relevant documentation on the behalf of Langley Gauss, PA,as directed by  Langley Gauss, PA while in the presence of Langley Gauss, [place].   An After Visit Summary was printed and given to the patient.  Langley Gauss, [place] [facility] [phone]

## 2023-09-26 LAB — COMPREHENSIVE METABOLIC PANEL
ALT: 33 [IU]/L — ABNORMAL HIGH (ref 0–32)
AST: 41 [IU]/L — ABNORMAL HIGH (ref 0–40)
Albumin: 4.4 g/dL (ref 3.8–4.8)
Alkaline Phosphatase: 79 [IU]/L (ref 44–121)
BUN/Creatinine Ratio: 17 (ref 12–28)
BUN: 18 mg/dL (ref 8–27)
Bilirubin Total: 0.8 mg/dL (ref 0.0–1.2)
CO2: 24 mmol/L (ref 20–29)
Calcium: 10 mg/dL (ref 8.7–10.3)
Chloride: 102 mmol/L (ref 96–106)
Creatinine, Ser: 1.03 mg/dL — ABNORMAL HIGH (ref 0.57–1.00)
Globulin, Total: 2.8 g/dL (ref 1.5–4.5)
Glucose: 105 mg/dL — ABNORMAL HIGH (ref 70–99)
Potassium: 4.6 mmol/L (ref 3.5–5.2)
Sodium: 142 mmol/L (ref 134–144)
Total Protein: 7.2 g/dL (ref 6.0–8.5)
eGFR: 55 mL/min/{1.73_m2} — ABNORMAL LOW (ref >59)

## 2023-09-26 LAB — LIPID PANEL
Chol/HDL Ratio: 3.6 {ratio} (ref 0.0–4.4)
Cholesterol, Total: 134 mg/dL (ref 100–199)
HDL: 37 mg/dL — ABNORMAL LOW (ref >39)
LDL Chol Calc (NIH): 76 mg/dL (ref 0–99)
Triglycerides: 118 mg/dL (ref 0–149)
VLDL Cholesterol Cal: 21 mg/dL (ref 5–40)

## 2023-09-26 LAB — CBC WITH DIFFERENTIAL/PLATELET
Basophils Absolute: 0.1 10*3/uL (ref 0.0–0.2)
Basos: 1 %
EOS (ABSOLUTE): 0.1 10*3/uL (ref 0.0–0.4)
Eos: 1 %
Hematocrit: 44.9 % (ref 34.0–46.6)
Hemoglobin: 15.1 g/dL (ref 11.1–15.9)
Immature Grans (Abs): 0 10*3/uL (ref 0.0–0.1)
Immature Granulocytes: 0 %
Lymphocytes Absolute: 3.3 10*3/uL — ABNORMAL HIGH (ref 0.7–3.1)
Lymphs: 52 %
MCH: 32.9 pg (ref 26.6–33.0)
MCHC: 33.6 g/dL (ref 31.5–35.7)
MCV: 98 fL — ABNORMAL HIGH (ref 79–97)
Monocytes Absolute: 0.4 10*3/uL (ref 0.1–0.9)
Monocytes: 6 %
Neutrophils Absolute: 2.6 10*3/uL (ref 1.4–7.0)
Neutrophils: 40 %
Platelets: 256 10*3/uL (ref 150–450)
RBC: 4.59 x10E6/uL (ref 3.77–5.28)
RDW: 12 % (ref 11.7–15.4)
WBC: 6.4 10*3/uL (ref 3.4–10.8)

## 2023-09-26 LAB — HEMOGLOBIN A1C
Est. average glucose Bld gHb Est-mCnc: 146 mg/dL
Hgb A1c MFr Bld: 6.7 % — ABNORMAL HIGH (ref 4.8–5.6)

## 2023-09-26 NOTE — Assessment & Plan Note (Signed)
Controlled Continue taking Prilosec 40mg  as directed Will adjust medicine depending on symptoms

## 2023-09-26 NOTE — Assessment & Plan Note (Signed)
Well controlled.  Continue to work on eating a healthy diet and exercise.  Labs drawn today.   No major side effects reported, and no issues with compliance. The current medical regimen is effective;  continue present plan with Zestoretic 20-12.5mg  Will adjust medication as needed depending on labs BP Readings from Last 3 Encounters:  09/25/23 124/78  06/23/23 132/64  06/04/23 124/80

## 2023-09-26 NOTE — Assessment & Plan Note (Addendum)
A1C below 7, indicating good control. -Continue current management with Metformin 500mg , Ozempic .5mg  -Repeat A1C today to monitor glucose control.

## 2023-09-26 NOTE — Assessment & Plan Note (Signed)
Controlled Continue to take current medications as directed Will adjust treatment as needed depending on labs

## 2023-09-26 NOTE — Assessment & Plan Note (Signed)
Labs drawn today Continue taking Lipitor 40mg  as prescribed Will adjust treatment depending on labs

## 2023-10-26 ENCOUNTER — Other Ambulatory Visit: Payer: Self-pay | Admitting: Physician Assistant

## 2023-10-26 DIAGNOSIS — E782 Mixed hyperlipidemia: Secondary | ICD-10-CM

## 2023-11-23 ENCOUNTER — Other Ambulatory Visit: Payer: Self-pay | Admitting: Physician Assistant

## 2023-11-23 DIAGNOSIS — E1169 Type 2 diabetes mellitus with other specified complication: Secondary | ICD-10-CM

## 2023-12-04 ENCOUNTER — Other Ambulatory Visit: Payer: Self-pay | Admitting: Physician Assistant

## 2023-12-04 DIAGNOSIS — K2100 Gastro-esophageal reflux disease with esophagitis, without bleeding: Secondary | ICD-10-CM

## 2024-01-14 ENCOUNTER — Other Ambulatory Visit: Payer: Self-pay | Admitting: Physician Assistant

## 2024-01-14 DIAGNOSIS — I152 Hypertension secondary to endocrine disorders: Secondary | ICD-10-CM

## 2024-01-14 DIAGNOSIS — E1169 Type 2 diabetes mellitus with other specified complication: Secondary | ICD-10-CM

## 2024-01-24 DIAGNOSIS — E860 Dehydration: Secondary | ICD-10-CM | POA: Diagnosis not present

## 2024-01-24 DIAGNOSIS — R1032 Left lower quadrant pain: Secondary | ICD-10-CM | POA: Diagnosis not present

## 2024-01-24 DIAGNOSIS — M1990 Unspecified osteoarthritis, unspecified site: Secondary | ICD-10-CM | POA: Diagnosis not present

## 2024-01-24 DIAGNOSIS — R1084 Generalized abdominal pain: Secondary | ICD-10-CM | POA: Diagnosis not present

## 2024-01-24 DIAGNOSIS — Z791 Long term (current) use of non-steroidal anti-inflammatories (NSAID): Secondary | ICD-10-CM | POA: Diagnosis not present

## 2024-01-24 DIAGNOSIS — R197 Diarrhea, unspecified: Secondary | ICD-10-CM | POA: Diagnosis not present

## 2024-01-24 DIAGNOSIS — I10 Essential (primary) hypertension: Secondary | ICD-10-CM | POA: Diagnosis not present

## 2024-01-24 DIAGNOSIS — Z7984 Long term (current) use of oral hypoglycemic drugs: Secondary | ICD-10-CM | POA: Diagnosis not present

## 2024-01-24 DIAGNOSIS — R109 Unspecified abdominal pain: Secondary | ICD-10-CM | POA: Diagnosis not present

## 2024-01-24 DIAGNOSIS — E871 Hypo-osmolality and hyponatremia: Secondary | ICD-10-CM | POA: Diagnosis not present

## 2024-01-24 DIAGNOSIS — E7800 Pure hypercholesterolemia, unspecified: Secondary | ICD-10-CM | POA: Diagnosis not present

## 2024-01-24 DIAGNOSIS — K529 Noninfective gastroenteritis and colitis, unspecified: Secondary | ICD-10-CM | POA: Diagnosis not present

## 2024-01-24 DIAGNOSIS — R531 Weakness: Secondary | ICD-10-CM | POA: Diagnosis not present

## 2024-01-24 DIAGNOSIS — K55039 Acute (reversible) ischemia of large intestine, extent unspecified: Secondary | ICD-10-CM | POA: Diagnosis not present

## 2024-01-24 DIAGNOSIS — E876 Hypokalemia: Secondary | ICD-10-CM | POA: Diagnosis not present

## 2024-01-24 DIAGNOSIS — Z7985 Long-term (current) use of injectable non-insulin antidiabetic drugs: Secondary | ICD-10-CM | POA: Diagnosis not present

## 2024-01-24 DIAGNOSIS — E1165 Type 2 diabetes mellitus with hyperglycemia: Secondary | ICD-10-CM | POA: Diagnosis not present

## 2024-01-24 DIAGNOSIS — Z79899 Other long term (current) drug therapy: Secondary | ICD-10-CM | POA: Diagnosis not present

## 2024-01-25 NOTE — Progress Notes (Deleted)
 Subjective:  Patient ID: Diane Chang, female    DOB: 10-25-1940  Age: 83 y.o. MRN: [id number]  Chief Complaint  Patient presents with   Medical Management of Chronic Issues    Discussed the use of AI scribe software for clinical note transcription with the patient, who gave verbal consent to proceed.    Diabetes:  Complications: Glucose checking:Every Morning Glucose logs: 100 - 120 fasting Most recent A1C: 6.7 Current medications: Ozempic  0.5 mg every Sunday, metformin  1000 mg daily Last Eye Exam: UTD Foot checks: 02/21/2023   Hyperlipidemia: Current medications: Lipitor 40 mg daily   Hypertension: Current medications: Lisinopril /HCTZ 20-12.5 mg daily   GERD: Omeprazole  40 mg daily.   Osteoporosis: Fosamax  70 mg weekly   Vitamin D  deficiency: Patient is currently not taking medication.   Diet: regular     Exercise: keeping the house up, and walking at stores.   Urge incontinence: Patient states that her symptoms have improved drastically       06/04/2023    9:49 AM 02/21/2023    8:42 AM 10/23/2022    8:04 AM 10/23/2022    8:02 AM 07/18/2022    8:02 AM  Depression screen PHQ 2/9  Decreased Interest 0 0 0 0 0  Down, Depressed, Hopeless 0 0 0 0 0  PHQ - 2 Score 0 0 0 0 0  Altered sleeping  0 0  0  Tired, decreased energy  0 0  0  Change in appetite  0 0  0  Feeling bad or failure about yourself   0 0  0  Trouble concentrating  0 0  0  Moving slowly or fidgety/restless  0 0  0  Suicidal thoughts  0 0  0  PHQ-9 Score  0 0  0  Difficult doing work/chores  Not difficult at all Not difficult at all  Not difficult at all        06/04/2023    9:40 AM  Fall Risk   Number falls in past yr: 0  Injury with Fall? 1  Risk for fall due to : History of fall(s);Impaired mobility  Follow up Education provided    Patient Care Team: Odilia Bennett, [place] as PCP - General (Physician Assistant) Devon Fogo, [facility] (Inactive) as Pharmacist (Pharmacist)   Review of  Systems  Current Outpatient Medications on File Prior to Visit  Medication Sig Dispense Refill   alendronate  (FOSAMAX ) 70 MG tablet Take 1 tablet (70 mg total) by mouth once a week. Take with a full glass of water on an empty stomach. 12 tablet 2   aspirin EC 81 MG tablet Take 81 mg by mouth daily. Swallow whole.     atorvastatin  (LIPITOR) 40 MG tablet TAKE 1 TABLET BY MOUTH EVERY DAY 100 tablet 0   Cholecalciferol (VITAMIN D3) 250 MCG (10000 UT) capsule Take 1 capsule (10,000 Units total) by mouth daily. 30 capsule 2   lisinopril -hydrochlorothiazide  (ZESTORETIC ) 20-12.5 MG tablet Take 1 tablet by mouth daily. 90 tablet 2   meloxicam  (MOBIC ) 15 MG tablet TAKE 1 TABLET (15 MG TOTAL) BY MOUTH DAILY. 90 tablet 1   metFORMIN  (GLUCOPHAGE ) 500 MG tablet TAKE 1 TABLET BY MOUTH EVERY DAY WITH BREAKFAST 90 tablet 2   omeprazole  (PRILOSEC) 40 MG capsule TAKE 1 CAPSULE BY MOUTH TWICE A DAY BEFORE A MEAL 180 capsule 1   Semaglutide ,0.25 or 0.5MG /DOS, (OZEMPIC , 0.25 OR 0.5 MG/DOSE,) 2 MG/3ML SOPN INJECT 0.5 MG INTO THE SKIN ONE TIME PER WEEK 3  mL 1   trospium  (SANCTURA ) 20 MG tablet TAKE 1 TABLET BY MOUTH TWICE A DAY 180 tablet 1   No current facility-administered medications on file prior to visit.   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   Past Surgical History:  Procedure Laterality Date   CHOLECYSTECTOMY      Family History  Problem Relation  Age of Onset   Heart attack Sister    Social History   Socioeconomic History   Marital status: Married    Spouse name: Not on file   Number of children: 1   Years of education: Not on file   Highest education level: Not on file  Occupational History   Occupation: packing  Tobacco Use   Smoking status: Former    Current packs/day: 0.00    Types: Cigarettes    Quit date: 1972    Years since quitting: 53.3   Smokeless tobacco: Never  Substance and Sexual Activity   Alcohol use: Never   Drug use: Never   Sexual activity: Not Currently  Other Topics Concern   Not on file  Social History Narrative   Not on file   Social Drivers of Health   Financial Resource Strain: Low Risk  (06/04/2023)   Overall Financial Resource Strain (CARDIA)    Difficulty of Paying Living Expenses: Not hard at all  Food Insecurity: No Food Insecurity (06/04/2023)   Hunger Vital Sign    Worried About Running Out of Food in the Last Year: Never true    Ran Out of Food in the Last Year: Never true  Transportation Needs: No Transportation Needs (06/04/2023)   PRAPARE - Administrator, Civil Service (Medical): No    Lack of Transportation (Non-Medical): No  Physical Activity: Inactive (06/04/2023)   Exercise Vital Sign    Days of Exercise per Week: 0 days    Minutes of Exercise per Session: 0 min  Stress: No Stress Concern Present (06/04/2023)   [employer] of Occupational Health - Occupational Stress Questionnaire    Feeling of Stress : Not at all  Social Connections: Moderately Isolated (06/04/2023)   Social Connection and Isolation Panel [NHANES]    Frequency of Communication with Friends and Family: More than three times a week    Frequency of Social Gatherings with Friends and Family: More than three times a week    Attends Religious Services: Never    Database administrator or Organizations: No    Attends Banker Meetings: Never    Marital Status: Living with partner     Objective:  LMP  (LMP Unknown)      09/25/2023   11:07 AM 06/23/2023    9:16 AM 06/04/2023    9:30 AM  BP/Weight  Systolic BP 124 132 124  Diastolic BP 78 64 80  Wt. (Lbs) 195 204.4 207  BMI 33.47 kg/m2 35.09 kg/m2 35.53 kg/m2    Physical Exam  Diabetic Foot Exam - Simple   No  data filed      Lab Results  Component Value Date   WBC 6.4 09/25/2023   HGB 15.1 09/25/2023   HCT 44.9 09/25/2023   PLT 256 09/25/2023   GLUCOSE 105 (H) 09/25/2023   CHOL 134 09/25/2023   TRIG 118 09/25/2023   HDL 37 (L) 09/25/2023   LDLCALC 76 09/25/2023   ALT 33 (H) 09/25/2023   AST 41 (H) 09/25/2023   NA 142 09/25/2023   K 4.6 09/25/2023   CL 102 09/25/2023   CREATININE 1.03 (H) 09/25/2023   BUN 18 09/25/2023   CO2 24 09/25/2023   TSH 2.030 10/23/2022   HGBA1C 6.7 (H) 09/25/2023   MICROALBUR 30 12/15/2020      Assessment & Plan:  Assessment and Plan       Benign hypertension  DM type 2 with diabetic mixed hyperlipidemia (HCC)  Age-related osteoporosis without current pathological fracture  Mixed hyperlipidemia     No orders of the defined types were placed in this encounter.   No orders of the defined types were placed in this encounter.    Follow-up: No follow-ups on file.   I,Farah Lepak I Leal-Borjas,acting as a scribe for [employer], PA.,have documented all relevant documentation on the behalf of Odilia Bennett, PA,as directed by  Odilia Bennett, PA while in the presence of Odilia Bennett, [place].   An After Visit Summary was printed and given to the patient.  Odilia Bennett, [place] [facility] [phone]

## 2024-01-26 ENCOUNTER — Encounter: Payer: 59 | Admitting: Physician Assistant

## 2024-01-26 DIAGNOSIS — M810 Age-related osteoporosis without current pathological fracture: Secondary | ICD-10-CM

## 2024-01-26 DIAGNOSIS — E1169 Type 2 diabetes mellitus with other specified complication: Secondary | ICD-10-CM

## 2024-01-26 DIAGNOSIS — I10 Essential (primary) hypertension: Secondary | ICD-10-CM

## 2024-01-26 DIAGNOSIS — E782 Mixed hyperlipidemia: Secondary | ICD-10-CM

## 2024-01-29 ENCOUNTER — Telehealth: Payer: Self-pay

## 2024-01-29 ENCOUNTER — Encounter: Payer: Self-pay | Admitting: Physician Assistant

## 2024-01-29 ENCOUNTER — Other Ambulatory Visit: Payer: Self-pay | Admitting: Family Medicine

## 2024-01-29 DIAGNOSIS — K529 Noninfective gastroenteritis and colitis, unspecified: Secondary | ICD-10-CM | POA: Insufficient documentation

## 2024-01-29 MED ORDER — BLOOD GLUCOSE TEST VI STRP: 1.0000 | ORAL_STRIP | Freq: Three times a day (TID) | 3 refills | Status: DC

## 2024-01-29 MED ORDER — LANCET DEVICE MISC: 1.0000 | Freq: Three times a day (TID) | 1 refills | Status: AC

## 2024-01-29 MED ORDER — BLOOD GLUCOSE MONITORING SUPPL DEVI: 1.0000 | Freq: Three times a day (TID) | 0 refills | Status: AC

## 2024-01-29 MED ORDER — LANCETS MISC. MISC
1.0000 | Freq: Three times a day (TID) | 3 refills | Status: DC
Start: 2024-01-29 — End: 2024-02-12

## 2024-01-29 NOTE — Transitions of Care (Post Inpatient/ED Visit) (Signed)
 01/29/2024  Name: Diane Chang MRN: [id number] DOB: 07/12/1941  Today's TOC FU Call Status: Today's TOC FU Call Status:: Successful TOC FU Call Completed TOC FU Call Complete Date: 01/29/24 Patient's Name and Date of Birth confirmed.  Transition Care Management Follow-up Telephone Call Date of Discharge: 01/28/24 Discharge Facility: Other [facility]) Name of Other (Non-[facility]) Discharge Facility: [facility] Type of Discharge: Inpatient Admission Primary Inpatient Discharge Diagnosis:: noninfective gastritis and colitis How have you been since you were released from the hospital?: Better Any questions or concerns?: No (patient denies questions)  Items Reviewed: Did you receive and understand the discharge instructions provided?: Yes Medications obtained,verified, and reconciled?: Yes (Medications Reviewed) Any new allergies since your discharge?: No Dietary orders reviewed?: Yes Type of Diet Ordered:: patient reports soft diet advance as tolerated Do you have support at home?: Yes People in Home [RPT]: child(ren), adult, significant other Name of Support/Comfort Primary Source: boyfriend, Allayne Arabian and adult son, Joey live with patient and help as needed  Medications Reviewed Today: Medications Reviewed Today     Reviewed by Sharmaine Dearth, RN (Registered Nurse) on 01/29/24 at 1331  Med List Status: <None>   Medication Order Taking? Sig Documenting Provider Last Dose Status Informant  alendronate  (FOSAMAX ) 70 MG tablet [id number] No Take 1 tablet (70 mg total) by mouth once a week. Take with a full glass of water on an empty stomach.  Patient not taking: Reported on 01/29/2024   Allegra Arch, NP Not Taking Active   aspirin EC 81 MG tablet [id number] No Take 81 mg by mouth daily. Swallow whole.  Patient not taking: Reported on 01/29/2024   [provider] Not Taking Active   atorvastatin  (LIPITOR) 40 MG tablet [id number] Yes TAKE 1 TABLET BY MOUTH EVERY  DAY Craft, [place], [place] Taking Active   Cholecalciferol (VITAMIN D3) 250 MCG (10000 UT) capsule [id number] No Take 1 capsule (10,000 Units total) by mouth daily.  Patient not taking: Reported on 01/29/2024   Odilia Bennett, [place] Not Taking Consider Medication Status and Discontinue (No longer needed (for PRN medications))   ciprofloxacin (CIPRO) 500 MG tablet [id number] Yes Take 500 mg by mouth 2 (two) times daily. [provider] Taking Active   lisinopril -hydrochlorothiazide  (ZESTORETIC ) 20-12.5 MG tablet [id number] Yes Take 1 tablet by mouth daily. Odilia Bennett, PA Taking Active   meloxicam  (MOBIC ) 15 MG tablet [id number] Yes TAKE 1 TABLET (15 MG TOTAL) BY MOUTH DAILY. Odilia Bennett, [place] Taking Active   metFORMIN  (GLUCOPHAGE ) 500 MG tablet [id number] Yes TAKE 1 TABLET BY MOUTH EVERY DAY WITH BREAKFAST Craft, [place], [place] Taking Active   metroNIDAZOLE (FLAGYL) 500 MG tablet [id number] Yes Take 500 mg by mouth 3 (three) times daily. [provider] Taking Active   omeprazole  (PRILOSEC) 40 MG capsule [id number] Yes TAKE 1 CAPSULE BY MOUTH TWICE A DAY BEFORE A MEAL Craft, [place], [place] Taking Active   potassium chloride SA (KLOR-CON M) 20 MEQ tablet [id number] Yes Take 20 mEq by mouth daily. [provider] Taking Active   Semaglutide ,0.25 or 0.5MG /DOS, (OZEMPIC , 0.25 OR 0.5 MG/DOSE,) 2 MG/3ML SOPN [id number] Yes INJECT 0.5 MG INTO THE SKIN ONE TIME PER WEEK Craft, [place], [place] Taking Active   trospium  (SANCTURA ) 20 MG tablet [id number] No TAKE 1 TABLET BY MOUTH TWICE A DAY  Patient not taking: Reported on 01/29/2024   Odilia Bennett, PA Not Taking Consider Medication Status and Discontinue (No longer needed (for PRN medications))  Home Care and Equipment/Supplies: Were Home Health Services Ordered?: No (patient states she didn't feel it was needed) Any new equipment or medical supplies ordered?: No  Functional Questionnaire: Do you need assistance with bathing/showering or dressing?:  No Do you need assistance with meal preparation?: Yes (Patient states boyfriend is preparing meals right now) Do you need assistance with eating?: No Do you need assistance with getting out of bed/getting out of a chair/moving?: No Do you have difficulty managing or taking your medications?: No  Follow up appointments reviewed: PCP Follow-up appointment confirmed?: NA (Patient declined offer for [facility] RN to schedule and states she will call and schedule) MD Provider Line Number:[phone] Given: No Specialist Hospital Follow-up appointment confirmed?: No (patient states she will call and schedule) Do you need transportation to your follow-up appointment?: No Do you understand care options if your condition(s) worsen?: Yes-patient verbalized understanding  SDOH Interventions Today    Flowsheet Row Most Recent Value  SDOH Interventions   Food Insecurity Interventions Intervention Not Indicated  Housing Interventions Intervention Not Indicated  Transportation Interventions Intervention Not Indicated  Utilities Interventions Intervention Not Indicated       Goals Addressed             This Visit's Progress    VBCI Transitions of Care (TOC) Care Plan       Problems:  Recent Hospitalization for treatment of noninfective gastritis and colitis Medication management barrier patient does not have 1 of her 4 new medications see action below  Goal:  Over the next 30 days, the patient will not experience hospital readmission  Interventions:  Transitions of Care: Doctor Visits  - discussed the importance of doctor visits Contacted provider for patient needs Message sent in EPIC to Dr Reinhold Carbine, Odilia Bennett, P.A. as follows:  Patient enrolled in [facility] program During medication review - Patient does not have the Lactobacillus Acidophilus 1 each PO BID #60 that is listed on discharge summary - Patient does have the 3 other new medications. Her boyfriend said the 3 meds he picked up was all they had  for her at [facility] - Can you send new Rx? Patient is also asking about new glucometer and supplies that would not be expensive/states she is not currently checking her sugar - Patient will call tomorrow to schedule hospital follow up - Reply from Dr Reinhold Carbine states Probiotic is OTC and MD is sending Rx for glucometer - TOC RN made boyfriend, Allayne Arabian aware   Diabetes Interventions: Assessed patient's understanding of A1c goal: <6.5% Reviewed medications with patient and discussed importance of medication adherence Discussed plans with patient for ongoing care management follow up and provided patient with direct contact information for care management team Reviewed scheduled/upcoming provider appointments including: TOC RN offered to schedule hospital follow up and patient declined and said she will call and schedule Lab Results  Component Value Date   HGBA1C 6.7 (H) 09/25/2023    Patient Self Care Activities:  Attend all scheduled provider appointments Call pharmacy for medication refills 3-7 days in advance of running out of medications Call provider office for new concerns or questions  Notify RN Care Manager of [facility] call rescheduling needs Participate in Transition of Care Program/Attend TOC scheduled calls Take medications as prescribed   Call and schedule PCP and GI hospital follow up appointments  Plan:  Telephone follow up appointment with care management team member scheduled for:  02/05/24 3pm The patient has been provided with contact information for the care management team  and has been advised to call with any health related questions or concerns.         Tonia Frankel RN, CCM [place]  VBCI-Population Health RN Care Manager [phone]

## 2024-02-01 ENCOUNTER — Other Ambulatory Visit: Payer: Self-pay | Admitting: Physician Assistant

## 2024-02-01 DIAGNOSIS — E782 Mixed hyperlipidemia: Secondary | ICD-10-CM

## 2024-02-03 NOTE — Progress Notes (Signed)
 This encounter was created in error - please disregard.

## 2024-02-05 ENCOUNTER — Ambulatory Visit (INDEPENDENT_AMBULATORY_CARE_PROVIDER_SITE_OTHER): Admitting: Physician Assistant

## 2024-02-05 ENCOUNTER — Encounter: Payer: Self-pay | Admitting: Physician Assistant

## 2024-02-05 ENCOUNTER — Telehealth: Payer: Self-pay

## 2024-02-05 VITALS — BP 132/82 | HR 73 | Temp 97.5°F | Ht 64.0 in | Wt 192.0 lb

## 2024-02-05 DIAGNOSIS — K51311 Ulcerative (chronic) rectosigmoiditis with rectal bleeding: Secondary | ICD-10-CM | POA: Diagnosis not present

## 2024-02-05 DIAGNOSIS — K529 Noninfective gastroenteritis and colitis, unspecified: Secondary | ICD-10-CM | POA: Diagnosis not present

## 2024-02-05 DIAGNOSIS — E669 Obesity, unspecified: Secondary | ICD-10-CM

## 2024-02-05 DIAGNOSIS — D352 Benign neoplasm of pituitary gland: Secondary | ICD-10-CM

## 2024-02-05 DIAGNOSIS — E1169 Type 2 diabetes mellitus with other specified complication: Secondary | ICD-10-CM | POA: Diagnosis not present

## 2024-02-05 DIAGNOSIS — I152 Hypertension secondary to endocrine disorders: Secondary | ICD-10-CM | POA: Diagnosis not present

## 2024-02-05 DIAGNOSIS — E1159 Type 2 diabetes mellitus with other circulatory complications: Secondary | ICD-10-CM | POA: Diagnosis not present

## 2024-02-05 MED ORDER — MESALAMINE (SULFITE-FREE) 4 GM/60ML RE ENEM: 1.0000 | ENEMA | Freq: Every evening | RECTAL | 0 refills | Status: DC

## 2024-02-05 NOTE — Patient Instructions (Signed)
 VISIT SUMMARY:  During your visit, we discussed your ongoing bowel issues related to ulcerative colitis, leg swelling, and concerns about low sodium levels. We reviewed your recent hospitalization and current symptoms, and we have made some adjustments to your treatment plan to help manage these issues.  YOUR PLAN:  -ULCERATIVE COLITIS: Ulcerative colitis is a chronic condition that causes inflammation and ulcers in the colon. You are experiencing a flare-up with mild to moderate rectal inflammation. We have prescribed rectal mesalamine  foam for 4 weeks to help reduce inflammation. Please follow the BRAT diet (bananas, rice, applesauce, toast) until your symptoms improve. It's important to follow up with your gastroenterologist and monitor for any recurrence of symptoms such as black, tarry stools.  -HYPONATREMIA: Hyponatremia is a condition where your blood sodium levels are too low. This can happen due to various reasons, including the use of diuretics. We have ordered labs to check your sodium and potassium levels. If your sodium remains low, we may consider prescribing salt tablets. We will also adjust your diuretic therapy based on the lab results.  -LEG SWELLING: Your leg swelling is likely due to fluid overload and potential electrolyte imbalance following your recent hospitalization. We recommend continuing your current medication, hydrochlorothiazide , and we will adjust your diuretic therapy based on upcoming lab results. Please monitor your leg swelling and fluid status closely.  INSTRUCTIONS:  Please follow up with your gastroenterologist as advised. Additionally, we will need to check your sodium and potassium levels through lab tests. Based on the results, we may adjust your diuretic therapy and consider prescribing salt tablets if necessary. Monitor your symptoms and report any significant changes.

## 2024-02-05 NOTE — Progress Notes (Unsigned)
 Subjective:  Patient ID: Diane Chang, female    DOB: 03-Dec-1940  Age: 83 y.o. MRN: [id number]  Chief Complaint  Patient presents with   Hospital follow up    HPI:  Follow up Hospitalization  Patient was admitted to [facility] on 01/24/24 and discharged on 01/28/24. She was treated for Noninfective Gastroenteritis and colitis. Treatment for this included Ciprofloxacin 500 mg and probiotic. Telephone follow up was done on after the initial visit She reports good compliance with treatment. She reports this condition is improved.   Discussed the use of AI scribe software for clinical note transcription with the patient, who gave verbal consent to proceed.  History of Present Illness   Diane Chang is an 83 year old female with ulcerative colitis who presents with continued bowel issues.  She experiences mucus-like stools two to three times daily. A similar episode resolved after three days three to four years ago, but the current episode persists. She was hospitalized recently for these symptoms. A colonoscopy in 2023 confirmed ulcerative colitis, and recent records indicate chronic colitis with focal mild activity and ulceration.  She has leg swelling since hospital discharge, likely due to fluids administered during her stay. She is concerned about low sodium levels noted upon discharge. She is not taking potassium supplements.  Significant stress from family dynamics, particularly involving her grandson and other family members living with her, is noted. She plans to focus more on her health and reduce caregiving responsibilities.  Her medications include hydrochlorothiazide , which she continues, and meloxicam  for pain. She has temporarily stopped metformin  due to bowel issues.          06/04/2023    9:49 AM 02/21/2023    8:42 AM 10/23/2022    8:04 AM 10/23/2022    8:02 AM 07/18/2022    8:02 AM  Depression screen PHQ 2/9  Decreased Interest 0 0 0 0 0  Down,  Depressed, Hopeless 0 0 0 0 0  PHQ - 2 Score 0 0 0 0 0  Altered sleeping  0 0  0  Tired, decreased energy  0 0  0  Change in appetite  0 0  0  Feeling bad or failure about yourself   0 0  0  Trouble concentrating  0 0  0  Moving slowly or fidgety/restless  0 0  0  Suicidal thoughts  0 0  0  PHQ-9 Score  0 0  0  Difficult doing work/chores  Not difficult at all Not difficult at all  Not difficult at all        06/04/2023    9:40 AM  Fall Risk   Number falls in past yr: 0  Injury with Fall? 1  Risk for fall due to : History of fall(s);Impaired mobility  Follow up Education provided    Patient Care Team: Odilia Bennett, [place] as PCP - General (Physician Assistant) Devon Fogo, [facility] (Inactive) as Pharmacist (Pharmacist)   Review of Systems  Constitutional:  Negative for appetite change, fatigue and fever.  HENT:  Negative for congestion, ear pain, sinus pressure and sore throat.   Respiratory:  Negative for cough, chest tightness, shortness of breath and wheezing.   Cardiovascular:  Positive for leg swelling (bilateral). Negative for chest pain and palpitations.  Gastrointestinal:  Positive for abdominal pain (Right lower quadrant). Negative for constipation, diarrhea, nausea and vomiting.  Genitourinary:  Negative for dysuria and hematuria.  Musculoskeletal:  Negative for arthralgias, back pain, joint swelling  and myalgias.  Skin:  Negative for rash.  Neurological:  Negative for dizziness, weakness and headaches.  Psychiatric/Behavioral:  Negative for dysphoric mood. The patient is not nervous/anxious.     Current Outpatient Medications on File Prior to Visit  Medication Sig Dispense Refill   atorvastatin  (LIPITOR) 40 MG tablet TAKE 1 TABLET BY MOUTH EVERY DAY 100 tablet 0   Blood Glucose Monitoring Suppl DEVI 1 each by Does not apply route in the morning, at noon, and at bedtime. May substitute to any manufacturer covered by patient's insurance. 1 each 0   Glucose Blood (BLOOD  GLUCOSE TEST STRIPS) STRP 1 each by In Vitro route in the morning, at noon, and at bedtime. May substitute to any manufacturer covered by patient's insurance. 100 strip 3   Lancet Device MISC 1 each by Does not apply route in the morning, at noon, and at bedtime. May substitute to any manufacturer covered by patient's insurance. 1 each 1   Lancets Misc. MISC 1 each by Does not apply route in the morning, at noon, and at bedtime. May substitute to any manufacturer covered by patient's insurance. 100 each 3   lisinopril -hydrochlorothiazide  (ZESTORETIC ) 20-12.5 MG tablet Take 1 tablet by mouth daily. 90 tablet 2   meloxicam  (MOBIC ) 15 MG tablet TAKE 1 TABLET (15 MG TOTAL) BY MOUTH DAILY. 90 tablet 1   metFORMIN  (GLUCOPHAGE ) 500 MG tablet TAKE 1 TABLET BY MOUTH EVERY DAY WITH BREAKFAST 90 tablet 2   omeprazole  (PRILOSEC) 40 MG capsule TAKE 1 CAPSULE BY MOUTH TWICE A DAY BEFORE A MEAL 180 capsule 1   Semaglutide ,0.25 or 0.5MG /DOS, (OZEMPIC , 0.25 OR 0.5 MG/DOSE,) 2 MG/3ML SOPN INJECT 0.5 MG INTO THE SKIN ONE TIME PER WEEK 3 mL 1   Cholecalciferol (VITAMIN D3) 250 MCG (10000 UT) capsule Take 1 capsule (10,000 Units total) by mouth daily. (Patient not taking: Reported on 02/05/2024) 30 capsule 2   potassium chloride SA (KLOR-CON M) 20 MEQ tablet Take 20 mEq by mouth daily. (Patient not taking: Reported on 02/05/2024)     No current facility-administered medications on file prior to visit.   Past Medical History:  Diagnosis Date   Age-related osteoporosis without current pathological fracture    Benign hypertension 12/17/2019   Benign paroxysmal positional vertigo 05/01/2020   BMI 32.0-32.9,adult 04/17/2020   BMI 35.0-35.9,adult 04/17/2020   Cardiac murmur 08/25/2020   DM type 2 with diabetic mixed hyperlipidemia (HCC)    Fatigue 08/13/2021   GERD (gastroesophageal reflux disease) 12/17/2019   Mixed hyperlipidemia    Obesity, diabetes, and hypertension syndrome (HCC) 12/17/2019   Osteoarthritis of hip  12/17/2019   Osteoporosis 12/17/2019   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   Past Surgical History:  Procedure Laterality Date   CHOLECYSTECTOMY      Family History  Problem Relation Age of Onset   Heart attack Sister    Social History   Socioeconomic History   Marital status: Married    Spouse name: Not on file   Number of children: 1   Years of education: Not on file   Highest education level: Not on file  Occupational History   Occupation: packing  Tobacco Use   Smoking status: Former    Current packs/day: 0.00    Types: Cigarettes    Quit date: 1972    Years since quitting: 53.3   Smokeless tobacco: Never  Substance and Sexual Activity   Alcohol use: Never   Drug use: Never   Sexual activity: Not Currently  Other Topics  Concern   Not on file  Social History Narrative   Not on file   Social Drivers of Health   Financial Resource Strain: Low Risk  (06/04/2023)   Overall Financial Resource Strain (CARDIA)    Difficulty of Paying Living Expenses: Not hard at all  Food Insecurity: No Food Insecurity (01/29/2024)   Hunger Vital Sign    Worried About Running Out of Food in the Last Year: Never true    Ran Out of Food in the Last Year: Never true  Transportation Needs: No Transportation Needs (01/29/2024)   PRAPARE - Administrator, Civil Service (Medical): No    Lack of Transportation (Non-Medical): No  Physical Activity: Inactive (06/04/2023)   Exercise Vital Sign    Days of Exercise per Week: 0 days    Minutes of Exercise per Session: 0 min  Stress: No Stress Concern Present (06/04/2023)   [employer] of Occupational Health - Occupational Stress Questionnaire    Feeling of Stress : Not at all  Social Connections: Moderately Isolated (06/04/2023)   Social Connection and Isolation Panel [NHANES]    Frequency of Communication with Friends and Family: More than three times a week    Frequency of Social Gatherings with Friends and Family: More than  three times a week    Attends Religious Services: Never    Database administrator or Organizations: No    Attends Engineer, structural: Never    Marital Status: Living with partner    Objective:  BP 132/82 (BP Location: Left Arm, Patient Position: Sitting)   Pulse 73   Temp (!) 97.5 F (36.4 C) (Temporal)   Ht 5\' 4"  (1.626 m)   Wt 192 lb (87.1 kg)   LMP  (LMP Unknown)   SpO2 98%   BMI 32.96 kg/m      02/05/2024    2:08 PM 09/25/2023   11:07 AM 06/23/2023    9:16 AM  BP/Weight  Systolic BP 132 124 132  Diastolic BP 82 78 64  Wt. (Lbs) 192 195 204.4  BMI 32.96 kg/m2 33.47 kg/m2 35.09 kg/m2    Physical Exam Vitals reviewed.  Constitutional:      Appearance: Normal appearance.  Neck:     Vascular: No carotid bruit.  Cardiovascular:     Rate and Rhythm: Normal rate and regular rhythm.     Heart sounds: Normal heart sounds.  Pulmonary:     Effort: Pulmonary effort is normal.     Breath sounds: Normal breath sounds.  Abdominal:     General: Bowel sounds are normal.     Palpations: Abdomen is soft.     Tenderness: There is abdominal tenderness. There is no guarding or rebound.     Comments: Described as an ache in the LLQ   Neurological:     Mental Status: She is alert and oriented to person, place, and time.  Psychiatric:        Mood and Affect: Mood normal.        Behavior: Behavior normal.     Diabetic Foot Exam - Simple   No data filed      Lab Results  Component Value Date   WBC 6.3 02/05/2024   HGB 13.5 02/05/2024   HCT 40.2 02/05/2024   PLT 307 02/05/2024   GLUCOSE 107 (H) 02/05/2024   CHOL 134 09/25/2023   TRIG 118 09/25/2023   HDL 37 (L) 09/25/2023   LDLCALC 76 09/25/2023   ALT 20 02/05/2024  AST 36 02/05/2024   NA 141 02/05/2024   K 4.8 02/05/2024   CL 104 02/05/2024   CREATININE 0.84 02/05/2024   BUN 6 (L) 02/05/2024   CO2 25 02/05/2024   TSH 2.030 10/23/2022   HGBA1C 6.7 (H) 09/25/2023   MICROALBUR 30 12/15/2020       Assessment & Plan:  Noninfectious gastroenteritis, unspecified type Assessment & Plan: Post-hospitalization hyponatremia with concern for sodium loss due to diuretics. - Order labs to check sodium and potassium levels. - Consider salt tablets if sodium remains low. - Adjust diuretic therapy based on lab results.  Orders: -     CBC with Differential/Platelet -     Comprehensive metabolic panel with GFR -     Sedimentation rate -     C-reactive protein  Ulcerative rectosigmoiditis with rectal bleeding (HCC) Assessment & Plan: Recent flare with mild to moderate rectal inflammation. No active infection. - Prescribe rectal mesalamine  foam for 4 weeks. - Advise adherence to BRAT diet until symptoms improve. - Follow up with gastroenterologist. - Monitor for recurrence of symptoms such as melena.   Pituitary adenoma (HCC) Assessment & Plan: No new or worsening symptoms Continue to monitor  Will repeat imaging if symptoms change   Obesity, diabetes, and hypertension syndrome (HCC) Assessment & Plan: Likely due to fluid overload and potential electrolyte imbalance. - Continue hydrochlorothiazide . - Adjust diuretic therapy based on lab results. - Monitor leg swelling and fluid status.       Meds ordered this encounter  Medications   DISCONTD: Mesalamine , Sulfite-Free, 4 GM/60ML ENEM    Sig: Place 1 enema rectally at bedtime.    Dispense:  1800 mL    Refill:  0    Orders Placed This Encounter  Procedures   CBC with Differential/Platelet   Comprehensive metabolic panel with GFR   Sedimentation rate   C-reactive protein     Follow-up: No follow-ups on file.   I,Lauren M Auman,acting as a Neurosurgeon for [employer], PA.,have documented all relevant documentation on the behalf of Odilia Bennett, PA,as directed by  Odilia Bennett, PA while in the presence of Odilia Bennett, [place].   An After Visit Summary was printed and given to the patient.  Odilia Bennett, [place] [facility] [phone]

## 2024-02-06 ENCOUNTER — Ambulatory Visit: Admitting: Physician Assistant

## 2024-02-06 ENCOUNTER — Ambulatory Visit: Payer: Self-pay | Admitting: *Deleted

## 2024-02-06 ENCOUNTER — Other Ambulatory Visit: Payer: Self-pay | Admitting: Physician Assistant

## 2024-02-06 DIAGNOSIS — K51311 Ulcerative (chronic) rectosigmoiditis with rectal bleeding: Secondary | ICD-10-CM

## 2024-02-06 LAB — CBC WITH DIFFERENTIAL/PLATELET
Basophils Absolute: 0.1 10*3/uL (ref 0.0–0.2)
Basos: 1 %
EOS (ABSOLUTE): 0.1 10*3/uL (ref 0.0–0.4)
Eos: 2 %
Hematocrit: 40.2 % (ref 34.0–46.6)
Hemoglobin: 13.5 g/dL (ref 11.1–15.9)
Immature Grans (Abs): 0 10*3/uL (ref 0.0–0.1)
Immature Granulocytes: 0 %
Lymphocytes Absolute: 3.1 10*3/uL (ref 0.7–3.1)
Lymphs: 48 %
MCH: 34.2 pg — ABNORMAL HIGH (ref 26.6–33.0)
MCHC: 33.6 g/dL (ref 31.5–35.7)
MCV: 102 fL — ABNORMAL HIGH (ref 79–97)
Monocytes Absolute: 0.4 10*3/uL (ref 0.1–0.9)
Monocytes: 7 %
Neutrophils Absolute: 2.6 10*3/uL (ref 1.4–7.0)
Neutrophils: 42 %
Platelets: 307 10*3/uL (ref 150–450)
RBC: 3.95 x10E6/uL (ref 3.77–5.28)
RDW: 13.3 % (ref 11.7–15.4)
WBC: 6.3 10*3/uL (ref 3.4–10.8)

## 2024-02-06 LAB — COMPREHENSIVE METABOLIC PANEL WITH GFR
ALT: 20 IU/L (ref 0–32)
AST: 36 IU/L (ref 0–40)
Albumin: 3.4 g/dL — ABNORMAL LOW (ref 3.8–4.8)
Alkaline Phosphatase: 61 IU/L (ref 44–121)
BUN/Creatinine Ratio: 7 — ABNORMAL LOW (ref 12–28)
BUN: 6 mg/dL — ABNORMAL LOW (ref 8–27)
Bilirubin Total: 0.4 mg/dL (ref 0.0–1.2)
CO2: 25 mmol/L (ref 20–29)
Calcium: 9.2 mg/dL (ref 8.7–10.3)
Chloride: 104 mmol/L (ref 96–106)
Creatinine, Ser: 0.84 mg/dL (ref 0.57–1.00)
Globulin, Total: 2.7 g/dL (ref 1.5–4.5)
Glucose: 107 mg/dL — ABNORMAL HIGH (ref 70–99)
Potassium: 4.8 mmol/L (ref 3.5–5.2)
Sodium: 141 mmol/L (ref 134–144)
Total Protein: 6.1 g/dL (ref 6.0–8.5)
eGFR: 70 mL/min/{1.73_m2} (ref >59)

## 2024-02-06 LAB — C-REACTIVE PROTEIN: CRP: <1 mg/L (ref 0–10)

## 2024-02-06 LAB — SEDIMENTATION RATE: Sed Rate: 2 mm/h (ref 0–40)

## 2024-02-06 MED ORDER — MESALAMINE 1.2 G PO TBEC: 2.4000 g | DELAYED_RELEASE_TABLET | Freq: Every day | ORAL | 1 refills | Status: DC

## 2024-02-06 NOTE — Telephone Encounter (Signed)
 Copied from CRM [id number]. Topic: Clinical - Medical Advice >> Feb 06, 2024  8:16 AM Rosaria Common wrote: Reason for CRM: Pt is urinating blood. Warm transfer to nurse. Reason for Disposition  Patient sounds very sick or weak to the triager    Made pt an appt for this morning at 10:00 with Odilia Bennett, PA.   She has known ulcerative colitis.  Having bright red blood after doing Mesalamine  he ordered for her.  Answer Assessment - Initial Assessment Questions 1. COLOR of URINE: "Describe the color of the urine."  (e.g., tea-colored, pink, red, bloody) "Do you have blood clots in your urine?" (e.g., none, pea, grape, small coin)     I'm having blood in my urine.   The doctor prescribed mesalalamimesalamine. 2. ONSET: "When did the bleeding start?"      This morning.  There is bright red blood.  Before you urinate there is bright red blood.   I wear Depends.  I felt it coming out.  When I went to the bathroom there was bright red blood blood.   I used it for the first time yesterday.     Ulcerative colitis is what I'm having.   The bleeding started before I came to the bathroom.   When I went to the bathroom there was bright red blood.    Bright red blood came out of my rectal area not urine.   This is the first time I bled like that.   3. EPISODES: "How many times has there been blood in the urine?" or "How many times today?"     Once 4. PAIN with URINATION: "Is there any pain with passing your urine?" If Yes, ask: "How bad is the pain?"  (Scale 1-10; or mild, moderate, severe)    - MILD: Complains slightly about urination hurting.    - MODERATE: Interferes with normal activities.      - SEVERE: Excruciating, unwilling or unable to urinate because of the pain.      I saw Odilia Bennett yesterday afternoon and he prescribed this. 5. FEVER: "Do you have a fever?" If Yes, ask: "What is your temperature, how was it measured, and when did it start?"     Not asked 6. ASSOCIATED SYMPTOMS: "Are you passing urine  more frequently than usual?"     No blood in urine.    It's coming from my rectum.    7. OTHER SYMPTOMS: "Do you have any other symptoms?" (e.g., back/flank pain, abdomen pain, vomiting)     I have ulcerative colitis.    I've never bled like this before. 8. PREGNANCY: "Is there any chance you are pregnant?" "When was your last menstrual period?"     N/A due to age  Answer Assessment - Initial Assessment Questions 1. APPEARANCE of BLOOD: "What color is it?" "Is it passed separately, on the surface of the stool, or mixed in with the stool?"      Disregard protocol for blood in urine.  Had a difficult time with triage because she had trouble explaining where the bleeding was coming from. She is having rectal bleeding after doing a Mesalamine  enema.   She is having bright red blood.   She was seen yesterday 5/1 by Odilia Bennett, PA for hospital follow up.    She has ulcerative colitis. 2. AMOUNT: "How much blood was passed?"      There is bright red blood in Depends and in toilet after doing the enema.   I've never  had this happen before.   This is not normal.    3. FREQUENCY: "How many times has blood been passed with the stools?"      This morning after doing the enema prescribed by Odilia Bennett, PA during visit yesterday. 4. ONSET: "When was the blood first seen in the stools?" (Days or weeks)      This morning 5. DIARRHEA: "Is there also some diarrhea?" If Yes, ask: "How many diarrhea stools in the past 24 hours?"      I did the Mesalamine .   When I pulled it out there was bright red blood. 6. CONSTIPATION: "Do you have constipation?" If Yes, ask: "How bad is it?"     Not asked 7. RECURRENT SYMPTOMS: "Have you had blood in your stools before?" If Yes, ask: "When was the last time?" and "What happened that time?"      Never had it bleed like this before. 8. BLOOD THINNERS: "Do you take any blood thinners?" (e.g., Coumadin/warfarin, Pradaxa/dabigatran, aspirin)     No 9. OTHER SYMPTOMS: "Do you  have any other symptoms?"  (e.g., abdomen pain, vomiting, dizziness, fever)     No 10. PREGNANCY: "Is there any chance you are pregnant?" "When was your last menstrual period?"       N/A due to age  Protocols used: Urine - Blood In-A-AH, Rectal Bleeding-A-AH  Chief Complaint: Rectal bleeding after doing Mesalamine  prescribed for her yesterday by Odilia Bennett, PA.   Seen 5/1 for hospital F/U now having bright red bleeding after doing the enema.   (It was difficult to triage this pt as she had trouble explaining what was going on and where the bleeding was coming from and that she had done an enema). Symptoms: Bright red rectal bleeding after doing Mesalamine  enema Frequency: This morning Pertinent Negatives: Patient denies dizziness.   No blood in urine. Disposition: [] ED /[] Urgent Care (no appt availability in office) / [x] Appointment(In office/virtual)/ []  [facility]/ [] Home Care/ [] Refused Recommended Disposition /[] [facility]/ []  Follow-up with PCP Additional Notes: Appt made for this morning at 10:00 with Odilia Bennett, PA with instructions to go to the ED if the bleeding becomes heavier or she gets dizzy or feels like passing out or has pain develop.    Pt. Was agreeable to this plan.

## 2024-02-09 ENCOUNTER — Telehealth: Payer: Self-pay

## 2024-02-09 NOTE — Transitions of Care (Post Inpatient/ED Visit) (Signed)
   02/09/2024  Name: MORJORIE KONOPA MRN: [id number] DOB: 1941-03-08  Today's TOC FU Call Status:    Attempted to reach the patient regarding the most recent Inpatient/ED visit.  Follow Up Plan: No further outreach attempts will be made at this time. We have been unable to contact the patient.   Goals Addressed             This Visit's Progress    VBCI Transitions of Care (TOC) Care Plan       Problems: Unable to reach patient for follow up x 3 - closing TOC Program - unable to meet goals Recent Hospitalization for treatment of noninfective gastritis and colitis Medication management barrier patient does not have 1 of her 4 new medications see action below  Goal:  Over the next 30 days, the patient will not experience hospital readmission  Interventions:  Unable to reach patient for follow up x 3 - closing TOC Program - unable to meet goals Transitions of Care: Doctor Visits  - discussed the importance of doctor visits Contacted provider for patient needs Message sent in EPIC to Dr Reinhold Carbine, Odilia Bennett, P.A. as follows:  Patient enrolled in [facility] program During medication review - Patient does not have the Lactobacillus Acidophilus 1 each PO BID #60 that is listed on discharge summary - Patient does have the 3 other new medications. Her boyfriend said the 3 meds he picked up was all they had for her at [facility] - Can you send new Rx? Patient is also asking about new glucometer and supplies that would not be expensive/states she is not currently checking her sugar - Patient will call tomorrow to schedule hospital follow up - Reply from Dr Reinhold Carbine states Probiotic is OTC and MD is sending Rx for glucometer - TOC RN made boyfriend, Allayne Arabian aware   Diabetes Interventions: Assessed patient's understanding of A1c goal: <6.5% Reviewed medications with patient and discussed importance of medication adherence Discussed plans with patient for ongoing care management follow up and provided patient  with direct contact information for care management team Reviewed scheduled/upcoming provider appointments including: TOC RN offered to schedule hospital follow up and patient declined and said she will call and schedule Lab Results  Component Value Date   HGBA1C 6.7 (H) 09/25/2023    Patient Self Care Activities:  Attend all scheduled provider appointments Call pharmacy for medication refills 3-7 days in advance of running out of medications Call provider office for new concerns or questions  Notify RN Care Manager of TOC call rescheduling needs Participate in Transition of Care Program/Attend TOC scheduled calls Take medications as prescribed   Call and schedule PCP and GI hospital follow up appointments  Plan:   Unable to reach patient for follow up x 3 - closing TOC Program - unable to meet goals The patient has been provided with contact information for the care management team and has been advised to call with any health related questions or concerns.          Tonia Frankel RN, CCM [place]  VBCI-Population Health RN Care Manager [phone]

## 2024-02-10 NOTE — Assessment & Plan Note (Signed)
 No new or worsening symptoms Continue to monitor  Will repeat imaging if symptoms change

## 2024-02-10 NOTE — Assessment & Plan Note (Signed)
 Likely due to fluid overload and potential electrolyte imbalance. - Continue hydrochlorothiazide . - Adjust diuretic therapy based on lab results. - Monitor leg swelling and fluid status.

## 2024-02-10 NOTE — Assessment & Plan Note (Signed)
 Post-hospitalization hyponatremia with concern for sodium loss due to diuretics. - Order labs to check sodium and potassium levels. - Consider salt tablets if sodium remains low. - Adjust diuretic therapy based on lab results.

## 2024-02-10 NOTE — Assessment & Plan Note (Signed)
 Recent flare with mild to moderate rectal inflammation. No active infection. - Prescribe rectal mesalamine  foam for 4 weeks. - Advise adherence to BRAT diet until symptoms improve. - Follow up with gastroenterologist. - Monitor for recurrence of symptoms such as melena.

## 2024-02-12 ENCOUNTER — Other Ambulatory Visit: Payer: Self-pay | Admitting: Physician Assistant

## 2024-02-12 NOTE — Telephone Encounter (Signed)
 Copied from CRM [id number]. Topic: Clinical - Medication Refill >> Feb 12, 2024  4:26 PM Rosaria Common wrote: Medication: Lancet Device MISC  Has the patient contacted their pharmacy? Yes (Agent: If no, request that the patient contact the pharmacy for the refill. If patient does not wish to contact the pharmacy document the reason why and proceed with request.) (Agent: If yes, when and what did the pharmacy advise?)  This is the patient's preferred pharmacy:  [facility] [address] [place] [facility] [address] [place] [address] Phone: [phone] Fax: [phone]  Is this the correct pharmacy for this prescription? Yes If no, delete pharmacy and type the correct one.   Has the prescription been filled recently? Yes  Is the patient out of the medication? No  Has the patient been seen for an appointment in the last year OR does the patient have an upcoming appointment? Yes  Can we respond through MyChart? No  Agent: Please be advised that Rx refills may take up to 3 business days. We ask that you follow-up with your pharmacy.

## 2024-02-13 MED ORDER — LANCETS MISC. MISC: 1.0000 | Freq: Three times a day (TID) | 3 refills | Status: AC

## 2024-02-28 ENCOUNTER — Other Ambulatory Visit: Payer: Self-pay | Admitting: Physician Assistant

## 2024-02-28 DIAGNOSIS — K51311 Ulcerative (chronic) rectosigmoiditis with rectal bleeding: Secondary | ICD-10-CM

## 2024-02-29 ENCOUNTER — Other Ambulatory Visit: Payer: Self-pay | Admitting: Physician Assistant

## 2024-02-29 DIAGNOSIS — K51311 Ulcerative (chronic) rectosigmoiditis with rectal bleeding: Secondary | ICD-10-CM

## 2024-03-01 ENCOUNTER — Other Ambulatory Visit: Payer: Self-pay | Admitting: Physician Assistant

## 2024-03-01 DIAGNOSIS — I10 Essential (primary) hypertension: Secondary | ICD-10-CM

## 2024-03-01 DIAGNOSIS — E1169 Type 2 diabetes mellitus with other specified complication: Secondary | ICD-10-CM

## 2024-03-04 ENCOUNTER — Other Ambulatory Visit: Payer: Self-pay | Admitting: Physician Assistant

## 2024-03-04 DIAGNOSIS — M19011 Primary osteoarthritis, right shoulder: Secondary | ICD-10-CM

## 2024-03-04 DIAGNOSIS — M160 Bilateral primary osteoarthritis of hip: Secondary | ICD-10-CM

## 2024-03-08 ENCOUNTER — Ambulatory Visit: Admitting: Physician Assistant

## 2024-03-08 ENCOUNTER — Encounter: Payer: Self-pay | Admitting: Physician Assistant

## 2024-03-08 VITALS — BP 120/80 | HR 75 | Temp 97.1°F | Resp 18 | Ht 64.0 in | Wt 183.0 lb

## 2024-03-08 DIAGNOSIS — M67432 Ganglion, left wrist: Secondary | ICD-10-CM | POA: Diagnosis not present

## 2024-03-08 DIAGNOSIS — K51311 Ulcerative (chronic) rectosigmoiditis with rectal bleeding: Secondary | ICD-10-CM

## 2024-03-08 MED ORDER — MESALAMINE 1.2 G PO TBEC: 1.2000 g | DELAYED_RELEASE_TABLET | Freq: Every day | ORAL | 3 refills | Status: AC

## 2024-03-08 NOTE — Patient Instructions (Signed)
 VISIT SUMMARY:  Today, you came in for a follow-up visit regarding your ulcerative colitis and a ganglion cyst on your left wrist. Your leg swelling has significantly reduced, and your gastrointestinal symptoms are stable with dietary management. The ganglion cyst remains non-painful and unchanged.  YOUR PLAN:  -ULCERATIVE COLITIS: Ulcerative colitis is a chronic condition that causes inflammation and sores in the digestive tract. Your condition is currently well-managed, but you experience occasional blood in your stool, likely due to dietary choices. We discussed the importance of dietary management and the potential need for daily medication to prevent future flare-ups. You should monitor for increased blood in your stool, abdominal pain, or diarrhea and report any worsening symptoms. Seek emergency care if you experience severe symptoms like dehydration, high fever, or severe abdominal pain. Include some fiber in your diet while avoiding high-fiber green vegetables like broccoli and spinach.  -GANGLION CYST OF LEFT WRIST: A ganglion cyst is a non-cancerous lump that often develops along the tendons or joints of your wrists or hands. Your cyst is non-painful and does not affect wrist movement. We discussed the nature of the cyst and treatment options, and you prefer to monitor it. You should watch for any changes in size, color, or symptoms such as numbness or impaired hand function. Consider an ultrasound if the cyst changes in size or becomes symptomatic.  INSTRUCTIONS:  Please review your current medications and consider starting a preventative medication to reduce future flare-ups of ulcerative colitis. Monitor for increased blood in your stool, abdominal pain, or diarrhea and report if symptoms worsen. Seek emergency care if you experience severe symptoms like dehydration, high fever, or severe abdominal pain. For the ganglion cyst, monitor for changes in size, color, or symptoms such as numbness  or impaired hand function. Consider an ultrasound if the cyst changes in size or becomes symptomatic.

## 2024-03-08 NOTE — Progress Notes (Signed)
 Subjective:  Patient ID: Diane Chang, female    DOB: 1941/07/25  Age: 83 y.o. MRN: [id number]  Chief Complaint  Patient presents with   Follow up for gastroenteritis   Rectal Bleeding    HPI:  Patient is here for follow up for noninfectious gastroenteritis,  Ulcerative rectosigmoiditis with rectal bleeding. Patient was admitted to [facility] on 01/24/24 and discharged on 01/28/24.   She mentioned that she feels better however she has some rectal bleeding # 1.  Discussed the use of AI scribe software for clinical note transcription with the patient, who gave verbal consent to proceed.  History of Present Illness   Diane Chang is an 83 year old female with ulcerative colitis who presents for follow-up of her condition.  She has experienced a significant reduction in leg swelling. Her gastrointestinal symptoms are stable as long as she adheres to dietary restrictions. She is cautious about introducing new foods and avoids those that cause discomfort.  She has a ganglion cyst on her left wrist, which is non-painful and does not affect wrist movement. The cyst has not changed in size or color recently.  Regarding her ulcerative colitis, she experiences occasional hematochezia, approximately once every one to two weeks, often linked to dietary indiscretions. She avoids green or spicy foods and notes that chicken exacerbates her symptoms. She manages her condition by monitoring her diet and maintaining regular bowel movements. She reports no current constipation, abdominal pain, or increased blood in her stool.  She is not in need of any medication refills at this time.           06/04/2023    9:49 AM 02/21/2023    8:42 AM 10/23/2022    8:04 AM 10/23/2022    8:02 AM 07/18/2022    8:02 AM  Depression screen PHQ 2/9  Decreased Interest 0 0 0 0 0  Down, Depressed, Hopeless 0 0 0 0 0  PHQ - 2 Score 0 0 0 0 0  Altered sleeping  0 0  0  Tired, decreased energy  0 0  0   Change in appetite  0 0  0  Feeling bad or failure about yourself   0 0  0  Trouble concentrating  0 0  0  Moving slowly or fidgety/restless  0 0  0  Suicidal thoughts  0 0  0  PHQ-9 Score  0 0  0  Difficult doing work/chores  Not difficult at all Not difficult at all  Not difficult at all        06/04/2023    9:40 AM  Fall Risk   Number falls in past yr: 0  Injury with Fall? 1  Risk for fall due to : History of fall(s);Impaired mobility  Follow up Education provided    Patient Care Team: Odilia Bennett, [place] as PCP - General (Physician Assistant) Devon Fogo, [facility] (Inactive) as Pharmacist (Pharmacist)   Review of Systems  Constitutional:  Negative for chills, fatigue and fever.  HENT:  Negative for congestion, ear pain and sore throat.   Respiratory:  Negative for cough and shortness of breath.   Cardiovascular:  Negative for chest pain and palpitations.  Gastrointestinal:  Negative for abdominal pain, constipation, diarrhea, nausea and vomiting.  Endocrine: Negative for polydipsia, polyphagia and polyuria.  Genitourinary:  Negative for difficulty urinating and dysuria.  Musculoskeletal:  Negative for arthralgias, back pain and myalgias.  Skin:  Negative for rash.  Neurological:  Negative for headaches.  Psychiatric/Behavioral:  Negative for dysphoric mood. The patient is not nervous/anxious.     Current Outpatient Medications on File Prior to Visit  Medication Sig Dispense Refill   Accu-Chek Softclix Lancets lancets 4 (four) times daily.     atorvastatin  (LIPITOR) 40 MG tablet TAKE 1 TABLET BY MOUTH EVERY DAY 100 tablet 0   Blood Glucose Monitoring Suppl (ACCU-CHEK GUIDE ME) w/Device KIT 3 (three) times daily.     Blood Glucose Monitoring Suppl DEVI 1 each by Does not apply route in the morning, at noon, and at bedtime. May substitute to any manufacturer covered by patient's insurance. 1 each 0   Cholecalciferol (VITAMIN D3) 250 MCG (10000 UT) capsule Take 1 capsule  (10,000 Units total) by mouth daily. 30 capsule 2   Glucose Blood (BLOOD GLUCOSE TEST STRIPS) STRP 1 each by In Vitro route in the morning, at noon, and at bedtime. May substitute to any manufacturer covered by patient's insurance. 100 strip 3   Lancets Misc. MISC 1 each by Does not apply route in the morning, at noon, and at bedtime. May substitute to any manufacturer covered by patient's insurance. 100 each 3   lisinopril -hydrochlorothiazide  (ZESTORETIC ) 20-12.5 MG tablet TAKE 1 TABLET BY MOUTH EVERY DAY 90 tablet 2   meloxicam  (MOBIC ) 15 MG tablet TAKE 1 TABLET (15 MG TOTAL) BY MOUTH DAILY. 90 tablet 1   metFORMIN  (GLUCOPHAGE ) 500 MG tablet TAKE 1 TABLET BY MOUTH EVERY DAY WITH BREAKFAST 90 tablet 2   omeprazole  (PRILOSEC) 40 MG capsule TAKE 1 CAPSULE BY MOUTH TWICE A DAY BEFORE A MEAL 180 capsule 1   Semaglutide ,0.25 or 0.5MG /DOS, (OZEMPIC , 0.25 OR 0.5 MG/DOSE,) 2 MG/3ML SOPN INJECT 0.5 MG INTO THE SKIN ONE TIME PER WEEK 3 mL 1   No current facility-administered medications on file prior to visit.   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   Past Surgical History:  Procedure Laterality Date   CHOLECYSTECTOMY      Family History  Problem Relation Age of Onset   Heart attack Sister    Social History   Socioeconomic History   Marital status: Married    Spouse name: Not on file   Number of children: 1   Years of education: Not on file    Highest education level: Not on file  Occupational History   Occupation: packing  Tobacco Use   Smoking status: Former    Current packs/day: 0.00    Types: Cigarettes    Quit date: 1972    Years since quitting: 53.4   Smokeless tobacco: Never  Substance and Sexual Activity   Alcohol use: Never   Drug use: Never   Sexual activity: Not Currently  Other Topics Concern   Not on file  Social History Narrative   Not on file   Social Drivers of Health   Financial Resource Strain: Low Risk  (06/04/2023)   Overall Financial Resource Strain (CARDIA)    Difficulty of Paying Living Expenses: Not hard at all  Food Insecurity: No Food Insecurity (01/29/2024)   Hunger Vital Sign    Worried About Running Out of Food in the Last Year: Never true    Ran Out of Food in the Last Year: Never true  Transportation Needs: No Transportation Needs (01/29/2024)   PRAPARE - Administrator, Civil Service (Medical): No    Lack of Transportation (Non-Medical): No  Physical Activity: Inactive (06/04/2023)   Exercise Vital Sign    Days of Exercise per Week: 0 days    Minutes of Exercise per Session: 0 min  Stress: No Stress Concern Present (06/04/2023)   [employer] of Occupational Health - Occupational Stress Questionnaire    Feeling of Stress : Not at all  Social Connections: Moderately Isolated (06/04/2023)   Social Connection and Isolation Panel [NHANES]    Frequency of Communication with Friends and Family: More than three times a week    Frequency of Social Gatherings with Friends and Family: More than three times a week    Attends Religious Services: Never    Database administrator or Organizations: No    Attends Engineer, structural: Never    Marital Status: Living with partner    Objective:  BP 120/80   Pulse 75   Temp (!) 97.1 F (36.2 C)   Resp 18   Ht 5\' 4"  (1.626 m)   Wt 183 lb (83 kg)   LMP  (LMP Unknown)   SpO2 96%   BMI 31.41 kg/m      03/08/2024    10:02 AM 02/05/2024    2:08 PM 09/25/2023   11:07 AM  BP/Weight  Systolic BP 120 132 124  Diastolic BP 80 82 78  Wt. (Lbs) 183 192 195  BMI 31.41 kg/m2 32.96 kg/m2 33.47 kg/m2    Physical Exam Vitals reviewed.  Constitutional:      Appearance: Normal appearance.  Cardiovascular:     Rate and Rhythm: Normal rate and regular rhythm.     Heart sounds: Normal heart sounds.  Pulmonary:     Effort: Pulmonary effort is normal.     Breath sounds: Normal breath sounds.  Abdominal:     General: Bowel sounds are normal.     Palpations: Abdomen is soft.     Tenderness: There is no abdominal tenderness.  Musculoskeletal:     Left wrist: Swelling present. No tenderness, bony tenderness or crepitus. Normal range of motion.       Arms:     Comments: Ganglion cyst  Neurological:     Mental Status: She is alert and oriented to person, place, and time.  Psychiatric:        Mood and Affect: Mood normal.        Behavior: Behavior normal.     Diabetic Foot Exam - Simple   No data filed      Lab Results  Component Value Date   WBC 6.3 02/05/2024   HGB 13.5 02/05/2024   HCT 40.2 02/05/2024   PLT 307 02/05/2024   GLUCOSE 107 (H) 02/05/2024   CHOL 134 09/25/2023   TRIG 118 09/25/2023   HDL 37 (L) 09/25/2023   LDLCALC 76 09/25/2023   ALT 20 02/05/2024   AST 36 02/05/2024   NA 141 02/05/2024   K 4.8 02/05/2024   CL 104 02/05/2024   CREATININE 0.84 02/05/2024   BUN 6 (L) 02/05/2024   CO2 25 02/05/2024   TSH 2.030 10/23/2022   HGBA1C 6.7 (H) 09/25/2023   MICROALBUR  30 12/15/2020      Assessment & Plan:  Ulcerative rectosigmoiditis with rectal bleeding [facility]) Assessment & Plan: Ulcerative colitis with recent flare-up, currently well-managed. Occasional hematochezia likely due to dietary indiscretions. Discussed dietary management and potential need for daily medication. Advised on emergency symptoms requiring immediate care. - Review current medications and consider initiating a  preventative medication to reduce future flare-ups. - Advise to monitor for increased hematochezia, abdominal pain, or diarrhea and report if symptoms worsen. - Instruct to seek emergency care if experiencing severe symptoms such as dehydration, high fever, or severe abdominal pain. - Provide dietary guidance to include some fiber while avoiding high-fiber green vegetables like broccoli and spinach. -Will send Mesalamine  1.2 to be taken daily to try and prevent flairs  Orders: -     Mesalamine ; Take 1 tablet (1.2 g total) by mouth daily with breakfast.  Dispense: 90 tablet; Refill: 3  Ganglion cyst of volar aspect of left wrist Assessment & Plan: Ganglion cyst on the ventral side of the left wrist, non-painful and not affecting wrist movement. Discussed nature and treatment options. She prefers monitoring. Ultrasound offered but declined. - Monitor the cyst for changes in size, color, or symptoms such as numbness or impaired hand function. - Consider ultrasound if the cyst changes in size or becomes symptomatic.        Meds ordered this encounter  Medications   mesalamine  (LIALDA ) 1.2 g EC tablet    Sig: Take 1 tablet (1.2 g total) by mouth daily with breakfast.    Dispense:  90 tablet    Refill:  3    No orders of the defined types were placed in this encounter.    Follow-up: Return in about 3 months (around 06/08/2024) for [facility] Chronic.   I,Marla I Leal-Borjas,acting as a scribe for [employer], PA.,have documented all relevant documentation on the behalf of Odilia Bennett, PA,as directed by  Odilia Bennett, PA while in the presence of Odilia Bennett, [place].   An After Visit Summary was printed and given to the patient.  Odilia Bennett, [place] [facility] [phone]

## 2024-03-08 NOTE — Assessment & Plan Note (Signed)
 Ulcerative colitis with recent flare-up, currently well-managed. Occasional hematochezia likely due to dietary indiscretions. Discussed dietary management and potential need for daily medication. Advised on emergency symptoms requiring immediate care. - Review current medications and consider initiating a preventative medication to reduce future flare-ups. - Advise to monitor for increased hematochezia, abdominal pain, or diarrhea and report if symptoms worsen. - Instruct to seek emergency care if experiencing severe symptoms such as dehydration, high fever, or severe abdominal pain. - Provide dietary guidance to include some fiber while avoiding high-fiber green vegetables like broccoli and spinach. -Will send Mesalamine  1.2 to be taken daily to try and prevent flairs

## 2024-03-08 NOTE — Assessment & Plan Note (Signed)
 Ganglion cyst on the ventral side of the left wrist, non-painful and not affecting wrist movement. Discussed nature and treatment options. She prefers monitoring. Ultrasound offered but declined. - Monitor the cyst for changes in size, color, or symptoms such as numbness or impaired hand function. - Consider ultrasound if the cyst changes in size or becomes symptomatic.

## 2024-03-09 NOTE — Telephone Encounter (Signed)
-----   Message from [facility] sent at 03/08/2024  2:43 PM EDT ----- Regarding: FW: Meslamine Left message for patient to return call. ----- Message ----- From: Odilia Bennett, PA Sent: 03/08/2024  12:05 PM EDT To: [facility] Subject: Meslamine                                      I have adjusted your medicine to help prevent flairs of your Ulcerative colitis. I have sent the new prescription to the pharmacy. It is a lower dose of what you were taking before to treat the flair. If you have any questions or concerns please don't hesitate to call

## 2024-03-10 ENCOUNTER — Other Ambulatory Visit: Payer: Self-pay | Admitting: Physician Assistant

## 2024-03-10 DIAGNOSIS — E1169 Type 2 diabetes mellitus with other specified complication: Secondary | ICD-10-CM

## 2024-03-10 DIAGNOSIS — E1159 Type 2 diabetes mellitus with other circulatory complications: Secondary | ICD-10-CM

## 2024-05-08 ENCOUNTER — Other Ambulatory Visit: Payer: Self-pay | Admitting: Physician Assistant

## 2024-05-08 DIAGNOSIS — E782 Mixed hyperlipidemia: Secondary | ICD-10-CM

## 2024-05-18 ENCOUNTER — Other Ambulatory Visit: Payer: Self-pay | Admitting: Family Medicine

## 2024-05-29 ENCOUNTER — Other Ambulatory Visit: Payer: Self-pay | Admitting: Physician Assistant

## 2024-05-29 DIAGNOSIS — K2100 Gastro-esophageal reflux disease with esophagitis, without bleeding: Secondary | ICD-10-CM

## 2024-06-08 ENCOUNTER — Ambulatory Visit: Payer: 59

## 2024-06-08 NOTE — Progress Notes (Signed)
 Subjective:  Patient ID: Diane Chang, female    DOB: 10-12-1940  Age: 83 y.o. MRN: [id number]  No chief complaint on file.   HPI: Discussed the use of AI scribe software for clinical note transcription with the patient, who gave verbal consent to proceed.         06/04/2023    9:49 AM 02/21/2023    8:42 AM 10/23/2022    8:04 AM 10/23/2022    8:02 AM 07/18/2022    8:02 AM  Depression screen PHQ 2/9  Decreased Interest 0 0 0 0 0  Down, Depressed, Hopeless 0 0 0 0 0  PHQ - 2 Score 0 0 0 0 0  Altered sleeping  0 0  0  Tired, decreased energy  0 0  0  Change in appetite  0 0  0  Feeling bad or failure about yourself   0 0  0  Trouble concentrating  0 0  0  Moving slowly or fidgety/restless  0 0  0  Suicidal thoughts  0 0  0  PHQ-9 Score  0 0  0  Difficult doing work/chores  Not difficult at all Not difficult at all  Not difficult at all        06/04/2023    9:40 AM  Fall Risk   Number falls in past yr: 0  Injury with Fall? 1  Risk for fall due to : History of fall(s);Impaired mobility  Follow up Education provided    Patient Care Team: Milon Cleaves, [place] as PCP - General (Physician Assistant) Nyle Rankin POUR, [facility] (Inactive) as Pharmacist (Pharmacist)   Review of Systems  Constitutional:  Negative for appetite change, fatigue and fever.  HENT:  Negative for congestion, ear pain, sinus pressure and sore throat.   Respiratory:  Negative for cough, chest tightness, shortness of breath and wheezing.   Cardiovascular:  Negative for chest pain and palpitations.  Gastrointestinal:  Negative for abdominal pain, constipation, diarrhea, nausea and vomiting.  Genitourinary:  Negative for dysuria and hematuria.  Musculoskeletal:  Negative for arthralgias, back pain, joint swelling and myalgias.  Skin:  Negative for rash.  Neurological:  Negative for dizziness, weakness and headaches.  Psychiatric/Behavioral:  Negative for dysphoric mood. The patient is not nervous/anxious.      Current Outpatient Medications on File Prior to Visit  Medication Sig Dispense Refill   ACCU-CHEK GUIDE TEST test strip CHECK BLOOD SUGAR IN THE MORNING, AT NOON, AND AT BEDTIME 100 strip 3   Accu-Chek Softclix Lancets lancets 4 (four) times daily.     atorvastatin  (LIPITOR) 40 MG tablet TAKE 1 TABLET BY MOUTH EVERY DAY 100 tablet 0   Blood Glucose Monitoring Suppl (ACCU-CHEK GUIDE ME) w/Device KIT 3 (three) times daily.     Blood Glucose Monitoring Suppl DEVI 1 each by Does not apply route in the morning, at noon, and at bedtime. May substitute to any manufacturer covered by patient's insurance. 1 each 0   Cholecalciferol (VITAMIN D3) 250 MCG (10000 UT) capsule Take 1 capsule (10,000 Units total) by mouth daily. 30 capsule 2   lisinopril -hydrochlorothiazide  (ZESTORETIC ) 20-12.5 MG tablet TAKE 1 TABLET BY MOUTH EVERY DAY 90 tablet 2   meloxicam  (MOBIC ) 15 MG tablet TAKE 1 TABLET (15 MG TOTAL) BY MOUTH DAILY. 90 tablet 1   mesalamine  (LIALDA ) 1.2 g EC tablet Take 1 tablet (1.2 g total) by mouth daily with breakfast. 90 tablet 3   metFORMIN  (GLUCOPHAGE ) 500 MG tablet TAKE 1 TABLET BY MOUTH  EVERY DAY WITH BREAKFAST 90 tablet 2   omeprazole  (PRILOSEC) 40 MG capsule TAKE 1 CAPSULE BY MOUTH TWICE A DAY BEFORE A MEAL 180 capsule 1   Semaglutide ,0.25 or 0.5MG /DOS, (OZEMPIC , 0.25 OR 0.5 MG/DOSE,) 2 MG/3ML SOPN INJECT 0.5 MG INTO THE SKIN ONE TIME PER WEEK 6 mL 1   No current facility-administered medications on file prior to visit.   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   Past Surgical History:  Procedure Laterality Date   CHOLECYSTECTOMY      Family History  Problem Relation Age of Onset   Heart attack Sister    Social History   Socioeconomic History   Marital status: Married    Spouse name: Not on file   Number of children: 1   Years of education: Not on file   Highest education level: Not on file  Occupational History   Occupation: packing  Tobacco Use   Smoking status: Former    Current packs/day: 0.00    Types: Cigarettes    Quit date: 1972    Years since quitting: 53.7   Smokeless tobacco: Never  Substance and Sexual Activity   Alcohol use: Never   Drug use: Never   Sexual activity: Not Currently  Other Topics Concern   Not on file  Social History Narrative   Not on file   Social Drivers of Health   Financial Resource Strain: Low Risk  (06/04/2023)   Overall Financial Resource Strain (CARDIA)    Difficulty of Paying Living Expenses: Not hard at all  Food Insecurity: No Food Insecurity (01/29/2024)   Hunger Vital Sign    Worried About Running Out of Food in the Last Year: Never true    Ran Out of Food in the Last Year: Never true  Transportation Needs: No Transportation Needs (01/29/2024)   PRAPARE - Administrator, Civil Service (Medical): No    Lack of Transportation (Non-Medical): No  Physical Activity: Inactive (06/04/2023)   Exercise Vital Sign    Days of Exercise per Week: 0 days    Minutes of Exercise per Session: 0 min  Stress: No Stress Concern Present (06/04/2023)   [employer] of Occupational Health - Occupational Stress Questionnaire    Feeling of Stress : Not at all  Social Connections: Moderately Isolated (06/04/2023)   Social Connection and Isolation Panel    Frequency of Communication with Friends and Family: More than three times a week    Frequency of  Social Gatherings with Friends and Family: More than three times a week    Attends Religious Services: Never    Database administrator or Organizations: No    Attends Banker Meetings: Never    Marital Status: Living with partner    Objective:  LMP  (LMP Unknown)      03/08/2024   10:02 AM 02/05/2024    2:08 PM 09/25/2023   11:07 AM  BP/Weight  Systolic BP 120 132 124  Diastolic  BP 80 82 78  Wt. (Lbs) 183 192 195  BMI 31.41 kg/m2 32.96 kg/m2 33.47 kg/m2    Physical Exam  {Perform Simple Foot Exam  Perform Detailed exam:1} {Insert foot Exam (Optional):30965}   Lab Results  Component Value Date   WBC 6.3 02/05/2024   HGB 13.5 02/05/2024   HCT 40.2 02/05/2024   PLT 307 02/05/2024   GLUCOSE 107 (H) 02/05/2024   CHOL 134 09/25/2023   TRIG 118 09/25/2023   HDL 37 (L) 09/25/2023   LDLCALC 76 09/25/2023   ALT 20 02/05/2024   AST 36 02/05/2024   NA 141 02/05/2024   K 4.8 02/05/2024   CL 104 02/05/2024   CREATININE 0.84 02/05/2024   BUN 6 (L) 02/05/2024   CO2 25 02/05/2024   TSH 2.030 10/23/2022   HGBA1C 6.7 (H) 09/25/2023      Assessment & Plan:  Benign hypertension  DM type 2 with diabetic mixed hyperlipidemia (HCC)  Gastroesophageal reflux disease with esophagitis without hemorrhage  Mixed hyperlipidemia  Obesity, diabetes, and hypertension syndrome (HCC)     Assessment and Plan      No orders of the defined types were placed in this encounter.   No orders of the defined types were placed in this encounter.    Follow-up: No follow-ups on file.   I,Lauren M Auman,acting as a Neurosurgeon for [employer], PA.,have documented all relevant documentation on the behalf of Nola Angles, PA,as directed by  Nola Angles, PA while in the presence of Nola Angles, [place].   An After Visit Summary was printed and given to the patient.  Nola Angles, [place] [facility] [phone]

## 2024-06-09 ENCOUNTER — Encounter: Payer: Self-pay | Admitting: Physician Assistant

## 2024-06-09 ENCOUNTER — Ambulatory Visit (INDEPENDENT_AMBULATORY_CARE_PROVIDER_SITE_OTHER): Admitting: Physician Assistant

## 2024-06-09 VITALS — BP 142/82 | HR 70 | Temp 97.1°F | Ht 64.0 in | Wt 188.0 lb

## 2024-06-09 DIAGNOSIS — I152 Hypertension secondary to endocrine disorders: Secondary | ICD-10-CM | POA: Diagnosis not present

## 2024-06-09 DIAGNOSIS — K2100 Gastro-esophageal reflux disease with esophagitis, without bleeding: Secondary | ICD-10-CM | POA: Diagnosis not present

## 2024-06-09 DIAGNOSIS — E1169 Type 2 diabetes mellitus with other specified complication: Secondary | ICD-10-CM | POA: Diagnosis not present

## 2024-06-09 DIAGNOSIS — E1159 Type 2 diabetes mellitus with other circulatory complications: Secondary | ICD-10-CM | POA: Diagnosis not present

## 2024-06-09 DIAGNOSIS — I10 Essential (primary) hypertension: Secondary | ICD-10-CM

## 2024-06-09 DIAGNOSIS — E669 Obesity, unspecified: Secondary | ICD-10-CM

## 2024-06-09 DIAGNOSIS — E782 Mixed hyperlipidemia: Secondary | ICD-10-CM

## 2024-06-09 NOTE — Assessment & Plan Note (Signed)
 Type 2 diabetes mellitus managed with Ozempic  effectively, no gastrointestinal side effects reported. Regular physical activity aids glycemic control. - Continue Ozempic  as prescribed. - Encourage daily walks. - Obtain blood sample for lab analysis. Lab Results  Component Value Date   HGBA1C 6.7 (H) 09/25/2023   HGBA1C 6.7 (H) 06/19/2023   HGBA1C 6.7 (H) 02/21/2023

## 2024-06-09 NOTE — Assessment & Plan Note (Signed)
 Labs drawn today Continue taking Lipitor 40mg  as prescribed Will adjust treatment depending on labs

## 2024-06-09 NOTE — Assessment & Plan Note (Signed)
 Essential hypertension Blood pressure slightly elevated, possibly due to physical activity or stress. - Recheck blood pressure during or after blood work. BP Readings from Last 3 Encounters:  06/09/24 (!) 142/82  03/08/24 120/80  02/05/24 132/82

## 2024-06-09 NOTE — Assessment & Plan Note (Signed)
 Controlled Continue to monitor blood pressure at home Labs have been drawn today Continue to monitor diet and exercise Will adjust treatment based on results

## 2024-06-09 NOTE — Assessment & Plan Note (Signed)
 Controlled Continue taking Prilosec 40mg  as directed Will adjust medicine depending on symptoms

## 2024-06-10 ENCOUNTER — Ambulatory Visit

## 2024-06-10 VITALS — Ht 64.0 in | Wt 188.0 lb

## 2024-06-10 DIAGNOSIS — Z0000 Encounter for general adult medical examination without abnormal findings: Secondary | ICD-10-CM

## 2024-06-10 LAB — COMPREHENSIVE METABOLIC PANEL WITH GFR
ALT: 30 IU/L (ref 0–32)
AST: 42 IU/L — ABNORMAL HIGH (ref 0–40)
Albumin: 4.1 g/dL (ref 3.8–4.8)
Alkaline Phosphatase: 60 IU/L (ref 44–121)
BUN/Creatinine Ratio: 17 (ref 12–28)
BUN: 16 mg/dL (ref 8–27)
Bilirubin Total: 0.6 mg/dL (ref 0.0–1.2)
CO2: 25 mmol/L (ref 20–29)
Calcium: 9.8 mg/dL (ref 8.7–10.3)
Chloride: 102 mmol/L (ref 96–106)
Creatinine, Ser: 0.92 mg/dL (ref 0.57–1.00)
Globulin, Total: 2.5 g/dL (ref 1.5–4.5)
Glucose: 107 mg/dL — ABNORMAL HIGH (ref 70–99)
Potassium: 4.2 mmol/L (ref 3.5–5.2)
Sodium: 141 mmol/L (ref 134–144)
Total Protein: 6.6 g/dL (ref 6.0–8.5)
eGFR: 63 mL/min/1.73 (ref >59)

## 2024-06-10 LAB — CBC WITH DIFFERENTIAL/PLATELET
Basophils Absolute: 0.1 x10E3/uL (ref 0.0–0.2)
Basos: 2 %
EOS (ABSOLUTE): 0.2 x10E3/uL (ref 0.0–0.4)
Eos: 4 %
Hematocrit: 42.7 % (ref 34.0–46.6)
Hemoglobin: 13.9 g/dL (ref 11.1–15.9)
Immature Grans (Abs): 0 x10E3/uL (ref 0.0–0.1)
Immature Granulocytes: 0 %
Lymphocytes Absolute: 2.4 x10E3/uL (ref 0.7–3.1)
Lymphs: 44 %
MCH: 32.5 pg (ref 26.6–33.0)
MCHC: 32.6 g/dL (ref 31.5–35.7)
MCV: 100 fL — ABNORMAL HIGH (ref 79–97)
Monocytes Absolute: 0.3 x10E3/uL (ref 0.1–0.9)
Monocytes: 6 %
Neutrophils Absolute: 2.3 x10E3/uL (ref 1.4–7.0)
Neutrophils: 44 %
Platelets: 220 x10E3/uL (ref 150–450)
RBC: 4.28 x10E6/uL (ref 3.77–5.28)
RDW: 11.9 % (ref 11.7–15.4)
WBC: 5.3 x10E3/uL (ref 3.4–10.8)

## 2024-06-10 LAB — LIPID PANEL
Chol/HDL Ratio: 3.4 ratio (ref 0.0–4.4)
Cholesterol, Total: 155 mg/dL (ref 100–199)
HDL: 46 mg/dL (ref >39)
LDL Chol Calc (NIH): 84 mg/dL (ref 0–99)
Triglycerides: 146 mg/dL (ref 0–149)
VLDL Cholesterol Cal: 25 mg/dL (ref 5–40)

## 2024-06-10 LAB — HEMOGLOBIN A1C
Est. average glucose Bld gHb Est-mCnc: 137 mg/dL
Hgb A1c MFr Bld: 6.4 % — ABNORMAL HIGH (ref 4.8–5.6)

## 2024-06-10 LAB — MICROALBUMIN / CREATININE URINE RATIO
Creatinine, Urine: 65.1 mg/dL
Microalb/Creat Ratio: <5 mg/g{creat} (ref 0–29)
Microalbumin, Urine: <3 ug/mL

## 2024-06-10 NOTE — Patient Instructions (Signed)
 [name] , Thank you for taking time out of your busy schedule to complete your Annual Wellness Visit with me. I enjoyed our conversation and look forward to speaking with you again next year. I, as well as your care team,  appreciate your ongoing commitment to your health goals. Please review the following plan we discussed and let me know if I can assist you in the future. Your Game plan/ To Do List    Follow up Visits: We will see or speak with you next year for your Next Medicare AWV with our clinical staff Have you seen your provider in the last 6 months (3 months if uncontrolled diabetes)? Yes  Clinician Recommendations:  Aim for 30 minutes of exercise or brisk walking, 6-8 glasses of water, and 5 servings of fruits and vegetables each day.       This is a list of the screenings recommended for you:  Health Maintenance  Topic Date Due   Zoster (Shingles) Vaccine (2 of 2) 11/19/2019   Hemoglobin A1C  03/25/2024   Yearly kidney health urinalysis for diabetes  04/22/2024   Eye exam for diabetics  06/17/2024   Mammogram  10/29/2024   Yearly kidney function blood test for diabetes  02/04/2025   Complete foot exam   06/09/2025   Medicare Annual Wellness Visit  06/10/2025   Colon Cancer Screening  06/10/2028   DTaP/Tdap/Td vaccine (2 - Td or Tdap) 04/20/2032   Pneumococcal Vaccine for age over 28  Completed   Flu Shot  Completed   DEXA scan (bone density measurement)  Completed   HPV Vaccine  Aged Out   Meningitis B Vaccine  Aged Out   COVID-19 Vaccine  Discontinued   Hepatitis C Screening  Discontinued    Advanced directives: (In Chart) A copy of your advanced directives are scanned into your chart should your provider ever need it.  Advance Care Planning is important because it:  [x]  Makes sure you receive the medical care that is consistent with your values, goals, and preferences  [x]  It provides guidance to your family and loved ones and reduces their decisional burden  about whether or not they are making the right decisions based on your wishes.  Follow the link provided in your after visit summary or read over the paperwork we have mailed to you to help you started getting your Advance Directives in place. If you need assistance in completing these, please reach out to us  so that we can help you!  See attachments for Preventive Care and Fall Prevention Tips.

## 2024-06-10 NOTE — Progress Notes (Signed)
 Subjective:   Diane Chang is a 83 y.o. who presents for a Medicare Wellness preventive visit.  As a reminder, Annual Wellness Visits don't include a physical exam, and some assessments may be limited, especially if this visit is performed virtually. We may recommend an in-person follow-up visit with your provider if needed.  Visit Complete: Virtual I connected with  Diane Chang on 06/10/24 by a audio enabled telemedicine application and verified that I am speaking with the correct person using two identifiers.  Patient Location: Home  Provider Location: Home Office  I discussed the limitations of evaluation and management by telemedicine. The patient expressed understanding and agreed to proceed.  Vital Signs: Because this visit was a virtual/telehealth visit, some criteria may be missing or patient reported. Any vitals not documented were not able to be obtained and vitals that have been documented are patient reported.  VideoDeclined- This patient declined Librarian, academic. Therefore the visit was completed with audio only.  Persons Participating in Visit: Patient.  AWV Questionnaire: No: Patient Medicare AWV questionnaire was not completed prior to this visit.  Cardiac Risk Factors include: advanced age (>29men, >53 women);diabetes mellitus;dyslipidemia;hypertension     Objective:    Today's Vitals   06/10/24 0927  Weight: 188 lb (85.3 kg)  Height: 5' 4 (1.626 m)   Body mass index is 32.27 kg/m.     06/10/2024    9:35 AM 06/04/2023    9:38 AM  Advanced Directives  Does Patient Have a Medical Advance Directive? Yes Yes  Type of Estate agent of [place];Living will Living will  Does patient want to make changes to medical advance directive? No - Patient declined   Copy of Healthcare Power of Attorney in Chart? Yes - validated most recent copy scanned in chart (See row information)     Current Medications  (verified) Outpatient Encounter Medications as of 06/10/2024  Medication Sig   ACCU-CHEK GUIDE TEST test strip CHECK BLOOD SUGAR IN THE MORNING, AT NOON, AND AT BEDTIME   Accu-Chek Softclix Lancets lancets 4 (four) times daily.   atorvastatin  (LIPITOR) 40 MG tablet TAKE 1 TABLET BY MOUTH EVERY DAY   Blood Glucose Monitoring Suppl (ACCU-CHEK GUIDE ME) w/Device KIT 3 (three) times daily.   Blood Glucose Monitoring Suppl DEVI 1 each by Does not apply route in the morning, at noon, and at bedtime. May substitute to any manufacturer covered by patient's insurance.   Cholecalciferol (VITAMIN D3) 250 MCG (10000 UT) capsule Take 1 capsule (10,000 Units total) by mouth daily.   lisinopril -hydrochlorothiazide  (ZESTORETIC ) 20-12.5 MG tablet TAKE 1 TABLET BY MOUTH EVERY DAY   meloxicam  (MOBIC ) 15 MG tablet TAKE 1 TABLET (15 MG TOTAL) BY MOUTH DAILY.   mesalamine  (LIALDA ) 1.2 g EC tablet Take 1 tablet (1.2 g total) by mouth daily with breakfast.   metFORMIN  (GLUCOPHAGE ) 500 MG tablet TAKE 1 TABLET BY MOUTH EVERY DAY WITH BREAKFAST   omeprazole  (PRILOSEC) 40 MG capsule TAKE 1 CAPSULE BY MOUTH TWICE A DAY BEFORE A MEAL   Semaglutide ,0.25 or 0.5MG /DOS, (OZEMPIC , 0.25 OR 0.5 MG/DOSE,) 2 MG/3ML SOPN INJECT 0.5 MG INTO THE SKIN ONE TIME PER WEEK   No facility-administered encounter medications on file as of 06/10/2024.    Allergies (verified) Patient has no known allergies.   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   Past Surgical History:  Procedure Laterality Date   CHOLECYSTECTOMY     Family History  Problem Relation Age of Onset   Heart attack Sister    Social History   Socioeconomic History   Marital status: Married    Spouse name: Not on file   Number of children: 1   Years of education: Not on file   Highest education level: Not on file  Occupational History   Occupation: packing  Tobacco Use   Smoking status: Former    Current packs/day: 0.00    Types: Cigarettes    Quit date: 1972    Years since quitting: 53.7   Smokeless tobacco: Never  Substance and Sexual Activity   Alcohol use: Never   Drug use: Never   Sexual activity: Not Currently  Other Topics Concern   Not on file  Social History Narrative   Not on file   Social Drivers of Health   Financial Resource Strain: Low Risk  (06/04/2023)   Overall Financial Resource Strain (CARDIA)    Difficulty of Paying Living Expenses: Not hard at all  Food Insecurity: No Food Insecurity (06/10/2024)   Hunger Vital Sign    Worried About Running Out of Food in the Last Year: Never true    Ran Out of Food in the Last Year: Never true  Transportation Needs: No Transportation Needs (06/10/2024)   PRAPARE - Administrator, Civil Service (Medical): No    Lack of Transportation (Non-Medical): No  Physical Activity: Sufficiently Active (06/10/2024)   Exercise Vital Sign    Days of Exercise per Week: 5 days    Minutes of Exercise per Session: 40 min  Stress: No Stress Concern Present (06/10/2024)   [employer] of Occupational Health - Occupational Stress Questionnaire    Feeling of Stress: Only a little  Social Connections: Moderately Isolated (06/10/2024)   Social Connection and Isolation Panel    Frequency of Communication with Friends and Family: More than three times a week    Frequency of Social Gatherings with Friends and Family: More than three times a  week    Attends Religious Services: Never    Database administrator or Organizations: No    Attends Engineer, structural: Never    Marital Status: Living with partner    Tobacco Counseling Counseling given: Not Answered    Clinical Intake:  Pre-visit preparation completed: Yes  Pain : No/denies pain  Diabetes: Yes CBG done?: No Did pt. bring in CBG monitor from home?: No  Lab Results  Component Value Date   HGBA1C 6.7 (H) 09/25/2023   HGBA1C 6.7 (H) 06/19/2023   HGBA1C 6.7 (H) 02/21/2023     How often do you need to have someone help you when you read instructions, pamphlets, or other written materials from your doctor or pharmacy?: 1 - Never  Interpreter Needed?: No  Information entered by :: Charmaine Bloodgood LPN   Activities of Daily Living     06/10/2024    9:35 AM  In your present state of health, do you have any difficulty performing the following activities:  Hearing? 0  Vision? 0  Difficulty concentrating or making decisions? 0  Walking or climbing stairs? 0  Dressing or bathing? 0  Doing errands, shopping? 0  Preparing Food and eating ? N  Using the Toilet? N  In the past six months, have you accidently leaked urine? N  Do you have problems with loss of bowel control? N  Managing your Medications? N  Managing your Finances? N  Housekeeping or managing your Housekeeping? N    Patient Care Team: Milon Cleaves, [place] as PCP - General (Physician Assistant) Vannie Elsie HERO, OD (Ophthalmology)  I have updated your Care Teams any recent Medical Services you may have received from other providers in the past year.     Assessment:   This is a routine wellness examination for Diane Chang.  Hearing/Vision screen Hearing Screening - Comments:: Denies hearing difficulties   Vision Screening - Comments:: Wears rx glasses - up to date with routine eye exams with Dr. Vannie     Goals Addressed             This Visit's Progress    Remain active and  independent   On track      Depression Screen     06/10/2024    9:33 AM 06/04/2023    9:49 AM 02/21/2023    8:42 AM 10/23/2022    8:04 AM 10/23/2022    8:02 AM 07/18/2022    8:02 AM 04/16/2022    8:41 AM  PHQ 2/9 Scores  PHQ - 2 Score 0 0 0 0 0 0 0  PHQ- 9 Score   0 0  0 0    Fall Risk     06/10/2024    9:34 AM 06/04/2023    9:40 AM 02/21/2023    8:42 AM 10/23/2022    8:02 AM 07/18/2022    8:02 AM  Fall Risk   Falls in the past year? 0  1 0 0  Number falls in past yr: 0 0 0 0 0  Injury with Fall? 0 1 0 0 0  Risk for fall due to : No Fall Risks History of fall(s);Impaired mobility History of fall(s) No Fall Risks No Fall Risks  Follow up Falls prevention discussed;Education provided;Falls evaluation completed Education provided Falls evaluation completed Falls evaluation completed  Falls evaluation completed      Data saved with a previous flowsheet row definition    MEDICARE RISK AT HOME:  Medicare Risk at Home Any stairs in or around the home?: No If so, are there any without handrails?: No Home free of loose throw rugs in walkways, pet beds, electrical cords, etc?: Yes Adequate lighting in your home to reduce risk of falls?: Yes Life alert?: No Use of a cane, walker or w/c?: No Grab bars in the bathroom?: Yes Shower chair or bench in shower?: Yes Elevated toilet seat or a handicapped toilet?: No  TIMED UP AND GO:  Was the test performed?  No  Cognitive Function: 6CIT completed        06/10/2024    9:35 AM 06/04/2023    9:40 AM  6CIT Screen  What Year? 0 points 0 points  What month? 0 points 0 points  What time? 0 points 0 points  Count back from 20 0 points 0 points  Months in reverse 0 points 0 points  Repeat phrase 0 points 0 points  Total Score 0 points 0 points    Immunizations Immunization History  Administered Date(s) Administered   Fluad  Quad(high Dose 65+) 08/19/2019, 07/18/2022   Fluad Trivalent(High Dose 65+) 06/23/2023   INFLUENZA, HIGH DOSE  SEASONAL PF 07/26/2020, 05/19/2024   Influenza-Unspecified 07/21/2021   PFIZER(Purple Top)SARS-COV-2 Vaccination 01/08/2020, 02/05/2020   PNEUMOCOCCAL CONJUGATE-20 10/25/2022   Pneumococcal Polysaccharide-23 04/16/2022   Tdap 04/20/2022   Zoster Recombinant(Shingrix) 09/24/2019    Screening Tests Health Maintenance  Topic Date Due   Zoster Vaccines- Shingrix (2 of 2) 11/19/2019   HEMOGLOBIN A1C  03/25/2024   Diabetic kidney evaluation - Urine ACR  04/22/2024   OPHTHALMOLOGY EXAM  06/17/2024   MAMMOGRAM  10/29/2024   Diabetic kidney evaluation - eGFR measurement  02/04/2025   FOOT EXAM  06/09/2025   Medicare Annual Wellness (AWV)  06/10/2025   Colonoscopy  06/10/2028   DTaP/Tdap/Td (2 - Td or Tdap) 04/20/2032   Pneumococcal Vaccine: 50+ Years  Completed   INFLUENZA VACCINE  Completed   DEXA SCAN  Completed   HPV VACCINES  Aged Out   Meningococcal B Vaccine  Aged Out   COVID-19 Vaccine  Discontinued   Hepatitis C Screening  Discontinued    Health Maintenance  Health Maintenance Due  Topic Date Due   Zoster Vaccines- Shingrix (2 of 2) 11/19/2019   HEMOGLOBIN A1C  03/25/2024   Diabetic kidney evaluation - Urine ACR  04/22/2024    Additional Screening:  Vision Screening: Recommended annual ophthalmology exams for early detection of glaucoma and other disorders of the eye. Would you like a referral to an eye doctor? No    Dental Screening: Recommended annual dental exams for proper oral hygiene  Community Resource Referral / Chronic Care Management: CRR required this visit?  No   CCM required this visit?  No   Plan:    I have personally reviewed and noted the following in the patient's chart:   Medical and social history Use of alcohol, tobacco or illicit drugs  Current medications and supplements including opioid prescriptions. Patient is not currently taking opioid prescriptions. Functional ability and status Nutritional status Physical activity Advanced  directives List of other physicians Hospitalizations, surgeries, and ER visits in previous 12 months Vitals Screenings to include cognitive, depression, and falls Referrals and appointments  In addition, I have reviewed and discussed with patient certain preventive protocols, quality metrics, and best practice recommendations. A written personalized care plan for preventive services as well as general preventive health recommendations were provided to patient.   Lavelle Pfeiffer [place], [place]   [id number]   After Visit Summary: (Pick Up) Due to this being a telephonic visit, with patients personalized plan was offered to patient and patient has requested to Pick up at office.  Notes: Nothing significant to report at this time.

## 2024-06-14 ENCOUNTER — Ambulatory Visit: Payer: Self-pay | Admitting: Physician Assistant

## 2024-08-06 ENCOUNTER — Other Ambulatory Visit: Payer: Self-pay | Admitting: Physician Assistant

## 2024-08-06 DIAGNOSIS — E119 Type 2 diabetes mellitus without complications: Secondary | ICD-10-CM

## 2024-08-06 DIAGNOSIS — E1169 Type 2 diabetes mellitus with other specified complication: Secondary | ICD-10-CM

## 2024-08-21 ENCOUNTER — Other Ambulatory Visit: Payer: Self-pay | Admitting: Physician Assistant

## 2024-08-21 DIAGNOSIS — E782 Mixed hyperlipidemia: Secondary | ICD-10-CM

## 2024-08-26 ENCOUNTER — Other Ambulatory Visit: Payer: Self-pay | Admitting: Physician Assistant

## 2024-08-26 DIAGNOSIS — M160 Bilateral primary osteoarthritis of hip: Secondary | ICD-10-CM

## 2024-08-26 DIAGNOSIS — M19011 Primary osteoarthritis, right shoulder: Secondary | ICD-10-CM

## 2024-10-11 ENCOUNTER — Ambulatory Visit (INDEPENDENT_AMBULATORY_CARE_PROVIDER_SITE_OTHER): Admitting: Physician Assistant

## 2024-10-11 ENCOUNTER — Encounter: Payer: Self-pay | Admitting: Physician Assistant

## 2024-10-11 VITALS — BP 130/78 | HR 68 | Temp 97.8°F | Ht 64.0 in | Wt 185.0 lb

## 2024-10-11 DIAGNOSIS — I10 Essential (primary) hypertension: Secondary | ICD-10-CM | POA: Diagnosis not present

## 2024-10-11 DIAGNOSIS — E782 Mixed hyperlipidemia: Secondary | ICD-10-CM

## 2024-10-11 DIAGNOSIS — E1169 Type 2 diabetes mellitus with other specified complication: Secondary | ICD-10-CM

## 2024-10-11 LAB — COMPREHENSIVE METABOLIC PANEL WITH GFR
ALT: 19 IU/L (ref 0–32)
AST: 22 IU/L (ref 0–40)
Albumin: 4 g/dL (ref 3.7–4.7)
Alkaline Phosphatase: 53 IU/L (ref 48–129)
BUN/Creatinine Ratio: 22 (ref 12–28)
BUN: 20 mg/dL (ref 8–27)
Bilirubin Total: 0.5 mg/dL (ref 0.0–1.2)
CO2: 23 mmol/L (ref 20–29)
Calcium: 9.8 mg/dL (ref 8.7–10.3)
Chloride: 105 mmol/L (ref 96–106)
Creatinine, Ser: 0.89 mg/dL (ref 0.57–1.00)
Globulin, Total: 2.5 g/dL (ref 1.5–4.5)
Glucose: 112 mg/dL — ABNORMAL HIGH (ref 70–99)
Potassium: 3.7 mmol/L (ref 3.5–5.2)
Sodium: 142 mmol/L (ref 134–144)
Total Protein: 6.5 g/dL (ref 6.0–8.5)
eGFR: 65 mL/min/1.73

## 2024-10-11 LAB — LIPID PANEL
Chol/HDL Ratio: 3.2 ratio (ref 0.0–4.4)
Cholesterol, Total: 145 mg/dL (ref 100–199)
HDL: 46 mg/dL
LDL Chol Calc (NIH): 80 mg/dL (ref 0–99)
Triglycerides: 104 mg/dL (ref 0–149)
VLDL Cholesterol Cal: 19 mg/dL (ref 5–40)

## 2024-10-11 LAB — CBC WITH DIFFERENTIAL/PLATELET
Basophils Absolute: 0.1 x10E3/uL (ref 0.0–0.2)
Basos: 1 %
EOS (ABSOLUTE): 0.2 x10E3/uL (ref 0.0–0.4)
Eos: 3 %
Hematocrit: 40.8 % (ref 34.0–46.6)
Hemoglobin: 13.5 g/dL (ref 11.1–15.9)
Immature Grans (Abs): 0 x10E3/uL (ref 0.0–0.1)
Immature Granulocytes: 0 %
Lymphocytes Absolute: 2.1 x10E3/uL (ref 0.7–3.1)
Lymphs: 35 %
MCH: 32.5 pg (ref 26.6–33.0)
MCHC: 33.1 g/dL (ref 31.5–35.7)
MCV: 98 fL — ABNORMAL HIGH (ref 79–97)
Monocytes Absolute: 0.3 x10E3/uL (ref 0.1–0.9)
Monocytes: 5 %
Neutrophils Absolute: 3.3 x10E3/uL (ref 1.4–7.0)
Neutrophils: 56 %
Platelets: 215 x10E3/uL (ref 150–450)
RBC: 4.15 x10E6/uL (ref 3.77–5.28)
RDW: 12.4 % (ref 11.7–15.4)
WBC: 5.9 x10E3/uL (ref 3.4–10.8)

## 2024-10-11 LAB — T4, FREE: Free T4: 1.17 ng/dL (ref 0.82–1.77)

## 2024-10-11 LAB — HEMOGLOBIN A1C
Est. average glucose Bld gHb Est-mCnc: 140 mg/dL
Hgb A1c MFr Bld: 6.5 % — ABNORMAL HIGH (ref 4.8–5.6)

## 2024-10-11 LAB — TSH: TSH: 2.14 u[IU]/mL (ref 0.450–4.500)

## 2024-10-11 NOTE — Progress Notes (Signed)
 "  Subjective:  Patient ID: Diane Chang, female    DOB: 1941-05-13  Age: 84 y.o. MRN: [id number]  Chief Complaint  Patient presents with   Medical Management of Chronic Issues    HPI: Discussed the use of AI scribe software for clinical note transcription with the patient, who gave verbal consent to proceed.  History of Present Illness Diane Chang is an 84 year old female who presents for a routine follow-up visit.  She has no major complaints or concerns at this time.  She mentions that her blood sugar was low this morning, describing it as 'one hand'. She stays cold, even in warm weather, and has not been diagnosed with liver issues.  She lives with her son and granddaughter, who has been clean for over a year. She supports her granddaughter, who is seeking employment and has a job interview at [employer]. She has a close relationship, although she sometimes 'butt heads'.         10/11/2024    8:58 AM 06/10/2024    9:33 AM 06/04/2023    9:49 AM 02/21/2023    8:42 AM 10/23/2022    8:04 AM  Depression screen PHQ 2/9  Decreased Interest 0 0 0 0 0  Down, Depressed, Hopeless 0 0 0 0 0  PHQ - 2 Score 0 0 0 0 0  Altered sleeping    0 0  Tired, decreased energy    0 0  Change in appetite    0 0  Feeling bad or failure about yourself     0 0  Trouble concentrating    0 0  Moving slowly or fidgety/restless    0 0  Suicidal thoughts    0 0  PHQ-9 Score    0  0   Difficult doing work/chores    Not difficult at all Not difficult at all     Data saved with a previous flowsheet row definition        10/11/2024    8:58 AM  Fall Risk   Falls in the past year? 0  Number falls in past yr: 0  Injury with Fall? 0  Risk for fall due to : No Fall Risks  Follow up Falls evaluation completed    Patient Care Team: Milon Cleaves, [place] as PCP - General (Physician Assistant) Vannie Elsie HERO, OD (Ophthalmology)   Review of Systems  Constitutional:  Negative for appetite change, fatigue and  fever.  HENT:  Negative for congestion, ear pain, sinus pressure and sore throat.   Respiratory:  Negative for cough, chest tightness, shortness of breath and wheezing.   Cardiovascular:  Negative for chest pain and palpitations.  Gastrointestinal:  Negative for abdominal pain, constipation, diarrhea, nausea and vomiting.  Genitourinary:  Negative for dysuria and hematuria.  Musculoskeletal:  Negative for arthralgias, back pain, joint swelling and myalgias.  Skin:  Negative for rash.  Neurological:  Negative for dizziness, weakness and headaches.  Psychiatric/Behavioral:  Negative for dysphoric mood. The patient is not nervous/anxious.     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   Osteoporosis 12/17/2019   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   Past Surgical History:  Procedure Laterality Date   CHOLECYSTECTOMY      Family History  Problem Relation Age of Onset   Heart attack Sister    Social History   Socioeconomic History   Marital status: Married    Spouse name: Not on file   Number of children: 1   Years of education: Not on file   Highest education level: Not on file  Occupational History   Occupation: packing  Tobacco Use   Smoking status: Former    Current packs/day: 0.00    Types: Cigarettes    Quit date: 1972    Years since quitting: 54.0   Smokeless tobacco: Never  Substance and Sexual Activity   Alcohol use: Never   Drug use:  Never   Sexual activity: Not Currently  Other Topics Concern   Not on file  Social History Narrative   Not on file   Social Drivers of Health   Tobacco Use: Medium Risk (10/11/2024)   Patient History    Smoking Tobacco Use: Former    Smokeless Tobacco Use: Never    Passive Exposure: Not on Actuary Strain: Low Risk (06/04/2023)   Overall Financial Resource Strain (CARDIA)    Difficulty of Paying Living Expenses: Not hard at all  Food Insecurity: No Food Insecurity (06/10/2024)   Epic    Worried About Radiation Protection Practitioner of Food in the Last Year: Never true    Ran Out of Food in the Last Year: Never true  Transportation Needs: No Transportation Needs (06/10/2024)   Epic    Lack of Transportation (Medical): No    Lack of Transportation (Non-Medical): No  Physical Activity: Sufficiently Active (06/10/2024)   Exercise Vital Sign    Days of Exercise per Week: 5 days    Minutes of Exercise per Session: 40 min  Stress: No Stress Concern Present (06/10/2024)   [employer] of Occupational Health - Occupational Stress Questionnaire    Feeling of Stress: Only a little  Social Connections: Moderately Isolated (06/10/2024)   Social Connection and Isolation Panel    Frequency of Communication with Friends and Family: More than three times a week    Frequency of Social Gatherings with Friends and Family: More than three times a week    Attends Religious Services: Never    Database Administrator or Organizations: No    Attends Banker Meetings: Never    Marital Status: Living with partner  Depression (PHQ2-9): Low Risk (10/11/2024)   Depression (PHQ2-9)    PHQ-2 Score: 0  Alcohol Screen: Low Risk (06/10/2024)   Alcohol Screen    Last Alcohol Screening Score (AUDIT): 0  Housing: Unknown (06/10/2024)   Epic    Unable to Pay for Housing in the Last Year: No    Number of Times Moved in the Last Year: Not on file    Homeless in the Last Year: No  Utilities: Not At Risk  (06/10/2024)   Epic    Threatened with loss of utilities: No  Health Literacy: Adequate Health Literacy (06/10/2024)   B1300 Health Literacy    Frequency of need for help with medical instructions: Never    Objective:  BP 130/78 (BP Location: Left Arm, Patient Position: Sitting)   Pulse 68   Temp 97.8 F (36.6 C) (Temporal)   Ht 5' 4 (  1.626 m)   Wt 185 lb (83.9 kg)   LMP  (LMP Unknown)   SpO2 96%   BMI 31.76 kg/m      10/11/2024    8:57 AM 06/10/2024    9:27 AM 06/09/2024    8:51 AM  BP/Weight  Systolic BP 130 -- 142  Diastolic BP 78 -- 82  Wt. (Lbs) 185 188 188  BMI 31.76 kg/m2 32.27 kg/m2 32.27 kg/m2    Physical Exam Vitals reviewed.  Constitutional:      Appearance: Normal appearance.  Cardiovascular:     Rate and Rhythm: Normal rate and regular rhythm.     Heart sounds: Normal heart sounds.  Pulmonary:     Effort: Pulmonary effort is normal.     Breath sounds: Normal breath sounds.  Abdominal:     General: Bowel sounds are normal.     Palpations: Abdomen is soft.     Tenderness: There is no abdominal tenderness.  Neurological:     Mental Status: She is alert and oriented to person, place, and time.  Psychiatric:        Mood and Affect: Mood normal.        Behavior: Behavior normal.         Lab Results  Component Value Date   WBC 5.3 06/09/2024   HGB 13.9 06/09/2024   HCT 42.7 06/09/2024   PLT 220 06/09/2024   GLUCOSE 107 (H) 06/09/2024   CHOL 155 06/09/2024   TRIG 146 06/09/2024   HDL 46 06/09/2024   LDLCALC 84 06/09/2024   ALT 30 06/09/2024   AST 42 (H) 06/09/2024   NA 141 06/09/2024   K 4.2 06/09/2024   CL 102 06/09/2024   CREATININE 0.92 06/09/2024   BUN 16 06/09/2024   CO2 25 06/09/2024   TSH 2.030 10/23/2022   HGBA1C 6.4 (H) 06/09/2024    Results for orders placed or performed in visit on 06/09/24  CBC with Differential/Platelet   Collection Time: 06/09/24  9:25 AM  Result Value Ref Range   WBC 5.3 3.4 - 10.8 x10E3/uL   RBC 4.28  3.77 - 5.28 x10E6/uL   Hemoglobin 13.9 11.1 - 15.9 g/dL   Hematocrit 57.2 65.9 - 46.6 %   MCV 100 (H) 79 - 97 fL   MCH 32.5 26.6 - 33.0 pg   MCHC 32.6 31.5 - 35.7 g/dL   RDW 88.0 88.2 - 84.5 %   Platelets 220 150 - 450 x10E3/uL   Neutrophils 44 Not Estab. %   Lymphs 44 Not Estab. %   Monocytes 6 Not Estab. %   Eos 4 Not Estab. %   Basos 2 Not Estab. %   Neutrophils Absolute 2.3 1.4 - 7.0 x10E3/uL   Lymphocytes Absolute 2.4 0.7 - 3.1 x10E3/uL   Monocytes Absolute 0.3 0.1 - 0.9 x10E3/uL   EOS (ABSOLUTE) 0.2 0.0 - 0.4 x10E3/uL   Basophils Absolute 0.1 0.0 - 0.2 x10E3/uL   Immature Granulocytes 0 Not Estab. %   Immature Grans (Abs) 0.0 0.0 - 0.1 x10E3/uL  Comprehensive metabolic panel with GFR   Collection Time: 06/09/24  9:25 AM  Result Value Ref Range   Glucose 107 (H) 70 - 99 mg/dL   BUN 16 8 - 27 mg/dL   Creatinine, Ser 9.07 0.57 - 1.00 mg/dL   eGFR 63 >40 fO/fpw/8.26   BUN/Creatinine Ratio 17 12 - 28   Sodium 141 134 - 144 mmol/L   Potassium 4.2 3.5 - 5.2 mmol/L   Chloride 102 96 -  106 mmol/L   CO2 25 20 - 29 mmol/L   Calcium  9.8 8.7 - 10.3 mg/dL   Total Protein 6.6 6.0 - 8.5 g/dL   Albumin 4.1 3.8 - 4.8 g/dL   Globulin, Total 2.5 1.5 - 4.5 g/dL   Bilirubin Total 0.6 0.0 - 1.2 mg/dL   Alkaline Phosphatase 60 44 - 121 IU/L   AST 42 (H) 0 - 40 IU/L   ALT 30 0 - 32 IU/L  Lipid panel   Collection Time: 06/09/24  9:25 AM  Result Value Ref Range   Cholesterol, Total 155 100 - 199 mg/dL   Triglycerides 853 0 - 149 mg/dL   HDL 46 >60 mg/dL   VLDL Cholesterol Cal 25 5 - 40 mg/dL   LDL Chol Calc (NIH) 84 0 - 99 mg/dL   Chol/HDL Ratio 3.4 0.0 - 4.4 ratio  Hemoglobin A1c   Collection Time: 06/09/24  9:25 AM  Result Value Ref Range   Hgb A1c MFr Bld 6.4 (H) 4.8 - 5.6 %   Est. average glucose Bld gHb Est-mCnc 137 mg/dL  Microalbumin / creatinine urine ratio   Collection Time: 06/09/24  9:25 AM  Result Value Ref Range   Creatinine, Urine 65.1 Not Estab. mg/dL    Microalbumin, Urine <3.0 Not Estab. ug/mL   Microalb/Creat Ratio <5 0 - 29 mg/g creat  .  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 - Ordered comprehensive blood draw for kidney and liver function, A1c, and cholesterol. - Consider finger stick for A1c and cholesterol if labs are normal. - Scheduled follow-up in May, extend to six months if stable. Orders:   Hemoglobin A1c  Mixed hyperlipidemia Controlled Continue to monitor diet and exercise Continue taking Lipitor 40mg  as prescribed Labs drawn today Will adjust treatment based on results Lab Results  Component Value Date   LDLCALC 84 06/09/2024    Orders:   CBC with Differential/Platelet   Comprehensive metabolic panel with GFR   Lipid panel  General Health Maintenance Routine health maintenance discussed. - Continue annual monitoring of blood glucose and cholesterol levels.  Body mass index is 31.76 kg/m.   No orders of the defined types were placed in this encounter.   No orders of the defined types were placed in this encounter.   Follow-up: No follow-ups on file.  An After Visit Summary was printed and given to the patient.   I,Lauren M Auman,acting as a neurosurgeon for [employer], [employer].,have documented all relevant documentation on the behalf of Nola Angles, PA,as directed by  Nola Angles, PA while in the presence of Nola Angles, [place].    Nola Angles, [place] [facility] [phone]     [1]   Current Outpatient Medications on File Prior to Visit  Medication Sig Dispense Refill   ACCU-CHEK GUIDE TEST test strip CHECK BLOOD SUGAR IN THE MORNING, AT NOON, AND AT BEDTIME 100 strip 3   Accu-Chek Softclix Lancets lancets 4 (four) times daily.     atorvastatin  (LIPITOR) 40 MG tablet TAKE 1 TABLET BY MOUTH  EVERY DAY 100 tablet 0   Blood Glucose Monitoring Suppl (ACCU-CHEK GUIDE ME) w/Device KIT 3 (three) times daily.     Blood Glucose Monitoring Suppl DEVI 1 each by Does not apply route in the morning, at noon, and at bedtime. May substitute to any manufacturer covered by patient's insurance. 1 each 0   Cholecalciferol (VITAMIN D3) 250 MCG (10000 UT) capsule Take 1 capsule (10,000 Units total) by mouth daily. 30 capsule 2   ibuprofen (ADVIL) 800 MG tablet Take 800 mg by mouth every 8 (eight) hours as needed for mild pain (pain score 1-3).     lisinopril -hydrochlorothiazide  (ZESTORETIC ) 20-12.5 MG tablet TAKE 1 TABLET BY MOUTH EVERY DAY 90 tablet 2   meloxicam  (MOBIC ) 15 MG tablet TAKE 1 TABLET (15 MG TOTAL) BY MOUTH DAILY. 90 tablet 1   mesalamine  (LIALDA ) 1.2 g EC tablet Take 1 tablet (1.2 g total) by mouth daily with breakfast. 90 tablet 3   metFORMIN  (GLUCOPHAGE ) 500 MG tablet TAKE 1 TABLET BY MOUTH EVERY DAY WITH BREAKFAST 90 tablet 2   omeprazole  (PRILOSEC) 40 MG capsule TAKE 1 CAPSULE BY MOUTH TWICE A DAY BEFORE A MEAL 180 capsule 1   Semaglutide ,0.25 or 0.5MG /DOS, (OZEMPIC , 0.25 OR 0.5 MG/DOSE,) 2 MG/3ML SOPN INJECT 0.5 MG INTO THE SKIN ONE TIME PER WEEK 6 mL 1   No current facility-administered medications on file prior to visit.   "

## 2024-10-11 NOTE — Assessment & Plan Note (Signed)
 Controlled Continue to monitor diet and exercise Continue taking Lipitor 40mg  as prescribed Labs drawn today Will adjust treatment based on results Lab Results  Component Value Date   LDLCALC 84 06/09/2024    Orders:   CBC with Differential/Platelet   Comprehensive metabolic panel with GFR   Lipid panel

## 2024-10-11 NOTE — Assessment & Plan Note (Signed)
 Type 2 diabetes mellitus with mixed hyperlipidemia Chronic condition with well-controlled cholesterol and improving blood glucose levels. - Ordered comprehensive blood draw for kidney and liver function, A1c, and cholesterol. - Consider finger stick for A1c and cholesterol if labs are normal. - Scheduled follow-up in May, extend to six months if stable. Orders:   Hemoglobin A1c

## 2024-10-11 NOTE — Assessment & Plan Note (Signed)
 Controlled Denies any new or worsening readings Continue to monitor Continue taking Lisinopril -hydrochlorothiazide  20-12.5mg  as prescribed Will adjust treatment based on readings BP Readings from Last 3 Encounters:  10/11/24 130/78  06/09/24 (!) 142/82  03/08/24 120/80    Orders:   T4, free   TSH

## 2024-10-12 ENCOUNTER — Ambulatory Visit: Payer: Self-pay | Admitting: Physician Assistant

## 2024-10-13 ENCOUNTER — Telehealth: Payer: Self-pay

## 2024-10-13 ENCOUNTER — Other Ambulatory Visit: Payer: Self-pay

## 2024-10-13 DIAGNOSIS — S62609A Fracture of unspecified phalanx of unspecified finger, initial encounter for closed fracture: Secondary | ICD-10-CM

## 2024-10-13 NOTE — Telephone Encounter (Signed)
 Referral placed.   Copied from CRM [id number]. Topic: Referral - Question >> Oct 13, 2024  8:30 AM Myrick T wrote: Reason for CRM: Melissa from [facility] called requesting a referral as they wanting to treat patient for a finger fracture. Please f/u with Melissa at [phone] ext [phone]

## 2024-10-14 ENCOUNTER — Other Ambulatory Visit: Payer: Self-pay | Admitting: Physician Assistant

## 2024-10-14 DIAGNOSIS — K2100 Gastro-esophageal reflux disease with esophagitis, without bleeding: Secondary | ICD-10-CM

## 2024-10-14 DIAGNOSIS — E1169 Type 2 diabetes mellitus with other specified complication: Secondary | ICD-10-CM

## 2025-02-08 ENCOUNTER — Ambulatory Visit: Admitting: Physician Assistant

## 2025-06-16 ENCOUNTER — Ambulatory Visit
